# Patient Record
Sex: Female | Born: 1960 | Race: White | Hispanic: No | Marital: Married | Smoking: Never smoker
Health system: Southern US, Community
[De-identification: ages and names within clinical notes are randomized; demographics above are authoritative.]

## PROBLEM LIST (undated history)

## (undated) DIAGNOSIS — J302 Other seasonal allergic rhinitis: Secondary | ICD-10-CM

## (undated) DIAGNOSIS — F411 Generalized anxiety disorder: Secondary | ICD-10-CM

## (undated) DIAGNOSIS — G43909 Migraine, unspecified, not intractable, without status migrainosus: Secondary | ICD-10-CM

## (undated) DIAGNOSIS — G5600 Carpal tunnel syndrome, unspecified upper limb: Secondary | ICD-10-CM

## (undated) DIAGNOSIS — M5090 Cervical disc disorder, unspecified, unspecified cervical region: Secondary | ICD-10-CM

## (undated) DIAGNOSIS — N289 Disorder of kidney and ureter, unspecified: Secondary | ICD-10-CM

## (undated) DIAGNOSIS — R200 Anesthesia of skin: Secondary | ICD-10-CM

## (undated) DIAGNOSIS — M519 Unspecified thoracic, thoracolumbar and lumbosacral intervertebral disc disorder: Secondary | ICD-10-CM

## (undated) DIAGNOSIS — R3981 Functional urinary incontinence: Secondary | ICD-10-CM

## (undated) DIAGNOSIS — T8859XA Other complications of anesthesia, initial encounter: Secondary | ICD-10-CM

## (undated) DIAGNOSIS — R112 Nausea with vomiting, unspecified: Secondary | ICD-10-CM

## (undated) DIAGNOSIS — T4145XA Adverse effect of unspecified anesthetic, initial encounter: Secondary | ICD-10-CM

## (undated) DIAGNOSIS — I10 Essential (primary) hypertension: Secondary | ICD-10-CM

## (undated) DIAGNOSIS — E559 Vitamin D deficiency, unspecified: Secondary | ICD-10-CM

## (undated) DIAGNOSIS — E785 Hyperlipidemia, unspecified: Secondary | ICD-10-CM

## (undated) DIAGNOSIS — Z9889 Other specified postprocedural states: Secondary | ICD-10-CM

## (undated) HISTORY — DX: Anesthesia of skin: R20.0

## (undated) HISTORY — DX: Generalized anxiety disorder: F41.1

## (undated) HISTORY — DX: Hyperlipidemia, unspecified: E78.5

## (undated) HISTORY — DX: Vitamin D deficiency, unspecified: E55.9

## (undated) HISTORY — DX: Carpal tunnel syndrome, unspecified upper limb: G56.00

## (undated) HISTORY — PX: TONSILLECTOMY: SUR1361

## (undated) HISTORY — PX: ABDOMINAL HYSTERECTOMY: SHX81

## (undated) HISTORY — DX: Migraine, unspecified, not intractable, without status migrainosus: G43.909

## (undated) HISTORY — PX: CHOLECYSTECTOMY: SHX55

## (undated) HISTORY — DX: Cervical disc disorder, unspecified, unspecified cervical region: M50.90

## (undated) HISTORY — DX: Essential (primary) hypertension: I10

## (undated) HISTORY — PX: BREAST CYST EXCISION: SHX579

## (undated) HISTORY — DX: Other seasonal allergic rhinitis: J30.2

## (undated) HISTORY — DX: Functional urinary incontinence: R39.81

## (undated) HISTORY — PX: BREAST BIOPSY: SHX20

## (undated) HISTORY — DX: Unspecified thoracic, thoracolumbar and lumbosacral intervertebral disc disorder: M51.9

---
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
## 2003-07-03 HISTORY — PX: BLADDER SUSPENSION: SHX72

## 2015-08-09 ENCOUNTER — Encounter: Payer: Self-pay | Admitting: Primary Care

## 2015-08-09 ENCOUNTER — Ambulatory Visit (INDEPENDENT_AMBULATORY_CARE_PROVIDER_SITE_OTHER): Payer: BLUE CROSS/BLUE SHIELD | Admitting: Primary Care

## 2015-08-09 VITALS — BP 132/84 | HR 80 | Temp 97.7°F | Ht 68.25 in | Wt 200.8 lb

## 2015-08-09 DIAGNOSIS — I10 Essential (primary) hypertension: Secondary | ICD-10-CM

## 2015-08-09 DIAGNOSIS — R208 Other disturbances of skin sensation: Secondary | ICD-10-CM

## 2015-08-09 DIAGNOSIS — F419 Anxiety disorder, unspecified: Secondary | ICD-10-CM | POA: Insufficient documentation

## 2015-08-09 DIAGNOSIS — E785 Hyperlipidemia, unspecified: Secondary | ICD-10-CM | POA: Insufficient documentation

## 2015-08-09 DIAGNOSIS — F329 Major depressive disorder, single episode, unspecified: Secondary | ICD-10-CM | POA: Insufficient documentation

## 2015-08-09 DIAGNOSIS — F411 Generalized anxiety disorder: Secondary | ICD-10-CM | POA: Diagnosis not present

## 2015-08-09 DIAGNOSIS — R200 Anesthesia of skin: Secondary | ICD-10-CM | POA: Insufficient documentation

## 2015-08-09 DIAGNOSIS — F32A Depression, unspecified: Secondary | ICD-10-CM | POA: Insufficient documentation

## 2015-08-09 NOTE — Progress Notes (Signed)
Subjective:    Patient ID: Elaine Young, female    DOB: [birth year], 55 y.o.   MRN: [id number]  HPI  Elaine Young is a 55 year old female who presents today to establish care and discuss the problems mentioned below. Will obtain old records. Her last physical was 3 months ago.   1) Essential Hypertension: Diagnosed several years ago. Currently managed on amlodipine 5 mg. Denies chest pain, shortness of breath. She will get stress related headaches which are not too bothersome. She checks her blood pressure daily at home and has been getting readings of 120-130/70's.   2) Hyperlipidemia: Diagnosed years ago. Currently managed on Crestor 10 mg. Denies myalgias.  3) Migraines: Diagnosed years ago and will get a migraine once every 6 months. Her father had a brain aneurysm which was clipped. She will go for CT scans once every 5 years for monitoring. She believes she is due for her CT as it's been over 5 years, but would like to wait until this summer.  4) Left lower extremity numbness: Present for years, thought to be related to 36 years of standing for her occupation. She underwent an injection 2 years ago. She takes gabapentin 600 mg TID with improvement in pain. She will take this only as needed.   5) Generalized Anxiety Disorder: Diagnosed 15 years ago. She's currently managed on Effexor XR 150 mg and feels well managed at this dose. Denies SI/HI. Occasionally gets panic attacks and will do deep breathing exercises in order to reduce anxiety.   6) Functional Incontinence: Bladder sling placed in 2005. Hysterectomy. Symptoms are not bothersome but does wear daily panty liners. She doesn't feel as though the sling is working as well as it should but is not interested in further evaluation at this time.  Review of Systems  HENT: Negative for rhinorrhea.   Respiratory: Negative for cough and shortness of breath.   Cardiovascular: Negative for chest pain.  Gastrointestinal: Negative for diarrhea  and constipation.  Genitourinary: Negative for difficulty urinating.  Musculoskeletal: Negative for myalgias and arthralgias.  Skin: Negative for rash.  Allergic/Immunologic: Positive for environmental allergies.  Neurological: Negative for dizziness.       Occasional numbness to left lower extremity.  Occasional headaches.       Past Medical History  Diagnosis Date  . Allergy     Social History   Social History  . Marital Status: Married    Spouse Name: N/A  . Number of Children: N/A  . Years of Education: N/A   Occupational History  . Not on file.   Social History Main Topics  . Smoking status: Never Smoker   . Smokeless tobacco: Not on file  . Alcohol Use: 0.0 oz/week    0 Standard drinks or equivalent per week  . Drug Use: No  . Sexual Activity: Not on file   Other Topics Concern  . Not on file   Social History Narrative   Married.   1 child. 1 grandchild.   Retired.    Enjoys reading.     Past Surgical History  Procedure Laterality Date  . Abdominal hysterectomy    . Bladder suspension  2005    Family History  Problem Relation Age of Onset  . Hyperlipidemia Mother   . Ovarian cancer Maternal Aunt   . Lung cancer Maternal Uncle   . Arthritis Maternal Grandmother   . Arthritis Maternal Grandfather   . Colon cancer Maternal Grandfather   . Arthritis Paternal Grandmother   .  Hypertension Paternal Grandmother   . Diabetes Paternal Grandmother   . Arthritis Paternal Grandfather   . Diabetes Paternal Grandfather     Allergies  Allergen Reactions  . Sulfa Antibiotics Rash    No current outpatient prescriptions on file prior to visit.   No current facility-administered medications on file prior to visit.    BP 132/84 mmHg  Pulse 80  Temp(Src) 97.7 F (36.5 C) (Oral)  Ht 5' 8.25" (1.734 m)  Wt 200 lb 12.8 oz (91.082 kg)  BMI 30.29 kg/m2  SpO2 97%    Objective:   Physical Exam  Constitutional: She is oriented to person, place, and  time. She appears well-nourished.  Cardiovascular: Normal rate and regular rhythm.   Pulmonary/Chest: Effort normal and breath sounds normal.  Neurological: She is alert and oriented to person, place, and time.  Skin: Skin is warm and dry.  Psychiatric: She has a normal mood and affect.          Assessment & Plan:

## 2015-08-09 NOTE — Patient Instructions (Addendum)
Please send me an e-mail through My Chart regarding refills of your medications.  I will collect your records and call you with our next follow up appointment.  It was a pleasure to meet you today! Please don't hesitate to call me with any questions. Welcome to [facility]!

## 2015-08-09 NOTE — Assessment & Plan Note (Signed)
Long standing history of. Currently managed on Effexor XR 150 mg and feels well managed at this dose. Continue same.

## 2015-08-09 NOTE — Assessment & Plan Note (Signed)
Diagnosed years ago. Currently managed on amlodipine 5 mg. BP stable today. Continue current regimen. Will obtain records for recent BMP.

## 2015-08-09 NOTE — Assessment & Plan Note (Signed)
Located to left side. Long history. Once receiving injections. Managed on gabapentin PRN and feels well managed.

## 2015-08-09 NOTE — Assessment & Plan Note (Signed)
Currently managed on Crestor 10 mg. Endorses normal last lipid panel 3-6 months ago. Will obtain records.

## 2015-08-09 NOTE — Progress Notes (Signed)
Pre visit review using our clinic review tool, if applicable. No additional management support is needed unless otherwise documented below in the visit note. 

## 2015-08-29 ENCOUNTER — Encounter: Payer: Self-pay | Admitting: Primary Care

## 2015-08-29 ENCOUNTER — Ambulatory Visit (INDEPENDENT_AMBULATORY_CARE_PROVIDER_SITE_OTHER): Payer: BLUE CROSS/BLUE SHIELD | Admitting: Primary Care

## 2015-08-29 VITALS — BP 122/72 | HR 86 | Temp 98.1°F | Ht 68.25 in | Wt 197.8 lb

## 2015-08-29 DIAGNOSIS — J069 Acute upper respiratory infection, unspecified: Secondary | ICD-10-CM | POA: Diagnosis not present

## 2015-08-29 NOTE — Patient Instructions (Signed)
Cough/Congestion: Try taking Mucinex DM. This will help loosen up the mucous in your chest. Ensure you take this medication with a full glass of water.  Nasal Congestion: Try using Flonase (fluticasone) nasal spray. Instill 2 sprays in each nostril once daily.   Continue Dayquil and Nyquil as discussed.  Please notify me if you develop persistent fevers of 101, start coughing up green mucous, notice increased fatigue or weakness, or feel worse after 1 week of onset of symptoms.   Increase consumption of water intake and rest.  Upper Respiratory Infection, Adult Most upper respiratory infections (URIs) are a viral infection of the air passages leading to the lungs. A URI affects the nose, throat, and upper air passages. The most common type of URI is nasopharyngitis and is typically referred to as "the common cold." URIs run their course and usually go away on their own. Most of the time, a URI does not require medical attention, but sometimes a bacterial infection in the upper airways can follow a viral infection. This is called a secondary infection. Sinus and middle ear infections are common types of secondary upper respiratory infections. Bacterial pneumonia can also complicate a URI. A URI can worsen asthma and chronic obstructive pulmonary disease (COPD). Sometimes, these complications can require emergency medical care and may be life threatening.  CAUSES Almost all URIs are caused by viruses. A virus is a type of germ and can spread from one person to another.  RISKS FACTORS You may be at risk for a URI if:   You smoke.   You have chronic heart or lung disease.  You have a weakened defense (immune) system.   You are very young or very old.   You have nasal allergies or asthma.  You work in crowded or poorly ventilated areas.  You work in health care facilities or schools. SIGNS AND SYMPTOMS  Symptoms typically develop 2-3 days after you come in contact with a cold virus.  Most viral URIs last 7-10 days. However, viral URIs from the influenza virus (flu virus) can last 14-18 days and are typically more severe. Symptoms may include:   Runny or stuffy (congested) nose.   Sneezing.   Cough.   Sore throat.   Headache.   Fatigue.   Fever.   Loss of appetite.   Pain in your forehead, behind your eyes, and over your cheekbones (sinus pain).  Muscle aches.  DIAGNOSIS  Your health care provider may diagnose a URI by:  Physical exam.  Tests to check that your symptoms are not due to another condition such as:  Strep throat.  Sinusitis.  Pneumonia.  Asthma. TREATMENT  A URI goes away on its own with time. It cannot be cured with medicines, but medicines may be prescribed or recommended to relieve symptoms. Medicines may help:  Reduce your fever.  Reduce your cough.  Relieve nasal congestion. HOME CARE INSTRUCTIONS   Take medicines only as directed by your health care provider.   Gargle warm saltwater or take cough drops to comfort your throat as directed by your health care provider.  Use a warm mist humidifier or inhale steam from a shower to increase air moisture. This may make it easier to breathe.  Drink enough fluid to keep your urine clear or pale yellow.   Eat soups and other clear broths and maintain good nutrition.   Rest as needed.   Return to work when your temperature has returned to normal or as your health care provider advises.  You may need to stay home longer to avoid infecting others. You can also use a face mask and careful hand washing to prevent spread of the virus.  Increase the usage of your inhaler if you have asthma.   Do not use any tobacco products, including cigarettes, chewing tobacco, or electronic cigarettes. If you need help quitting, ask your health care provider. PREVENTION  The best way to protect yourself from getting a cold is to practice good hygiene.   Avoid oral or hand contact  with people with cold symptoms.   Wash your hands often if contact occurs.  There is no clear evidence that vitamin C, vitamin E, echinacea, or exercise reduces the chance of developing a cold. However, it is always recommended to get plenty of rest, exercise, and practice good nutrition.  SEEK MEDICAL CARE IF:   You are getting worse rather than better.   Your symptoms are not controlled by medicine.   You have chills.  You have worsening shortness of breath.  You have brown or red mucus.  You have yellow or brown nasal discharge.  You have pain in your face, especially when you bend forward.  You have a fever.  You have swollen neck glands.  You have pain while swallowing.  You have white areas in the back of your throat. SEEK IMMEDIATE MEDICAL CARE IF:   You have severe or persistent:  Headache.  Ear pain.  Sinus pain.  Chest pain.  You have chronic lung disease and any of the following:  Wheezing.  Prolonged cough.  Coughing up blood.  A change in your usual mucus.  You have a stiff neck.  You have changes in your:  Vision.  Hearing.  Thinking.  Mood. MAKE SURE YOU:   Understand these instructions.  Will watch your condition.  Will get help right away if you are not doing well or get worse.   This information is not intended to replace advice given to you by your health care provider. Make sure you discuss any questions you have with your health care provider.   Document Released: 12/12/2000 Document Revised: 11/02/2014 Document Reviewed: 09/23/2013 Elsevier Interactive Patient Education [employer]

## 2015-08-29 NOTE — Progress Notes (Signed)
Pre visit review using our clinic review tool, if applicable. No additional management support is needed unless otherwise documented below in the visit note. 

## 2015-08-29 NOTE — Progress Notes (Signed)
Subjective:    Patient ID: Elaine Young, female    DOB: [birth year], 55 y.o.   MRN: [id number]  HPI  Ms. Sheehy is a 55 year old female who presents today with a chief complaint of cough. She also reports nasal congestion, headache, body aches, sinus pressure, chills, sore throat. Her symptoms have been present since Friday afternoon last week. She admits to low grade fevers at home. Her most bothersome symptom is her cough. She's been taking Dayquil and Nyquil at home with no improvement. Her cough is dry. She's been exposed to a co-worker who was diagnosed with the strep last week.  Review of Systems  Constitutional: Positive for fever, chills and fatigue.  HENT: Positive for congestion, sinus pressure and sore throat.   Respiratory: Positive for cough. Negative for shortness of breath.   Cardiovascular: Negative for chest pain.  Musculoskeletal: Positive for myalgias.  Neurological: Positive for headaches.        Past Medical History  Diagnosis Date  . Seasonal allergies   . Hyperlipidemia   . Essential hypertension   . Lower extremity numbness   . Generalized anxiety disorder   . Functional incontinence   . Migraines     Social History   Social History  . Marital Status: Married    Spouse Name: N/A  . Number of Children: N/A  . Years of Education: N/A   Occupational History  . Not on file.   Social History Main Topics  . Smoking status: Never Smoker   . Smokeless tobacco: Not on file  . Alcohol Use: 0.0 oz/week    0 Standard drinks or equivalent per week  . Drug Use: No  . Sexual Activity: Not on file   Other Topics Concern  . Not on file   Social History Narrative   Married.   1 child. 1 grandchild.   Retired.    Enjoys reading.     Past Surgical History  Procedure Laterality Date  . Abdominal hysterectomy    . Bladder suspension  2005    Family History  Problem Relation Age of Onset  . Hyperlipidemia Mother   . Ovarian cancer Maternal  Aunt   . Lung cancer Maternal Uncle   . Arthritis Maternal Grandmother   . Arthritis Maternal Grandfather   . Colon cancer Maternal Grandfather   . Arthritis Paternal Grandmother   . Hypertension Paternal Grandmother   . Diabetes Paternal Grandmother   . Arthritis Paternal Grandfather   . Diabetes Paternal Grandfather     Allergies  Allergen Reactions  . Codeine   . Latex   . Lipitor [Atorvastatin]   . Sulfa Antibiotics Rash    Current Outpatient Prescriptions on File Prior to Visit  Medication Sig Dispense Refill  . amLODipine (NORVASC) 5 MG tablet Take 5 mg by mouth daily.    Marland Kitchen gabapentin (NEURONTIN) 600 MG tablet Take 600 mg by mouth 3 (three) times daily.    . rosuvastatin (CRESTOR) 10 MG tablet Take 10 mg by mouth daily.    Marland Kitchen venlafaxine XR (EFFEXOR-XR) 150 MG 24 hr capsule Take 150 mg by mouth daily with breakfast.     No current facility-administered medications on file prior to visit.    BP 122/72 mmHg  Pulse 86  Temp(Src) 98.1 F (36.7 C) (Oral)  Ht 5' 8.25" (1.734 m)  Wt 197 lb 12.8 oz (89.721 kg)  BMI 29.84 kg/m2  SpO2 99%    Objective:   Physical Exam  Constitutional: She appears  well-nourished.  HENT:  Right Ear: Tympanic membrane and ear canal normal.  Left Ear: Tympanic membrane and ear canal normal.  Nose: Right sinus exhibits maxillary sinus tenderness. Right sinus exhibits no frontal sinus tenderness. Left sinus exhibits maxillary sinus tenderness. Left sinus exhibits no frontal sinus tenderness.  Mouth/Throat: Oropharynx is clear and moist.  Eyes: Conjunctivae are normal.  Neck: Neck supple.  Cardiovascular: Normal rate and regular rhythm.   Pulmonary/Chest: Effort normal and breath sounds normal. She has no wheezes. She has no rales.  Lymphadenopathy:    She has no cervical adenopathy.  Skin: Skin is warm and dry.          Assessment & Plan:  Viral URI:  Cough, chills, low grade fevers, sinus pressure x 3 days. She's not taken much  OTC for her symptoms. Exam with clear lungs and mostly normal ENT exam. Suspect viral involvement at this point and will treat with supportive measures. Mucinex, Flonase, Robitussin. Fluids, rest, return precautions provided.

## 2015-09-30 ENCOUNTER — Encounter: Payer: Self-pay | Admitting: Primary Care

## 2016-01-24 ENCOUNTER — Ambulatory Visit (INDEPENDENT_AMBULATORY_CARE_PROVIDER_SITE_OTHER): Payer: BLUE CROSS/BLUE SHIELD | Admitting: Primary Care

## 2016-01-24 ENCOUNTER — Encounter: Payer: Self-pay | Admitting: Primary Care

## 2016-01-24 VITALS — BP 122/84 | HR 80 | Temp 98.3°F | Ht 68.25 in | Wt 200.0 lb

## 2016-01-24 DIAGNOSIS — R21 Rash and other nonspecific skin eruption: Secondary | ICD-10-CM

## 2016-01-24 MED ORDER — TRIAMCINOLONE ACETONIDE 0.1 % EX CREA: 1.0000 "application " | TOPICAL_CREAM | Freq: Two times a day (BID) | CUTANEOUS | 0 refills | Status: DC

## 2016-01-24 NOTE — Patient Instructions (Signed)
I sent a prescription for Triamcinolone cream to your pharmacy. Apply this to the rash twice daily if no improvement or if the rash returns.  Ensure you are wearing sunscreen on your upcoming cruise. Have a great time!  Please schedule a physical with me before the end of the year. You may also schedule a lab only appointment 3-4 days prior. We will discuss your lab results in detail during your physical.  It was a pleasure to see you today!

## 2016-01-24 NOTE — Progress Notes (Signed)
Subjective:    Patient ID: Elaine Young, female    DOB: 1961-03-05, 55 y.o.   MRN: [id number]  HPI  [name] is a 55 year old female who presents today with a chief complaint of rash. Her rash is present to the bilateral lower portion of her extremities that has been present since Wednesday night last week. Her rash is red without the presence of bumps or lesions. Overall her rash has improved with only little erythema today but is worried as she will be traveling on a cruise in several days. She was outdoors at a theme park 1 week ago but noticed the rash after sitting at work all day. Denies itching; discomfort; tick bites; working out in the yard; changes in food clothing, soaps, detergents. No one else in her household has experienced a rash.   Review of Systems  Constitutional: Negative for fatigue and fever.  Musculoskeletal: Negative for arthralgias.  Skin: Positive for rash.       Denies pruritus        Past Medical History:  Diagnosis Date  . Carpal tunnel syndrome   . Cervical disc disorder   . Essential hypertension   . Functional incontinence   . Generalized anxiety disorder   . Hyperlipidemia   . Lower extremity numbness   . Lumbar disc disease   . Migraines   . Seasonal allergies   . Vitamin D deficiency      Social History   Social History  . Marital status: Married    Spouse name: N/A  . Number of children: N/A  . Years of education: N/A   Occupational History  . Not on file.   Social History Main Topics  . Smoking status: Never Smoker  . Smokeless tobacco: Not on file  . Alcohol use 0.0 oz/week  . Drug use: No  . Sexual activity: Not on file   Other Topics Concern  . Not on file   Social History Narrative   Married.   1 child. 1 grandchild.   Retired.    Enjoys reading.     Past Surgical History:  Procedure Laterality Date  . ABDOMINAL HYSTERECTOMY    . BLADDER SUSPENSION  2005    Family History  Problem Relation Age of  Onset  . Hyperlipidemia Mother   . Ovarian cancer Maternal Aunt   . Lung cancer Maternal Uncle   . Arthritis Maternal Grandmother   . Arthritis Maternal Grandfather   . Colon cancer Maternal Grandfather   . Arthritis Paternal Grandmother   . Hypertension Paternal Grandmother   . Diabetes Paternal Grandmother   . Arthritis Paternal Grandfather   . Diabetes Paternal Grandfather     Allergies  Allergen Reactions  . Codeine   . Latex   . Lipitor [Atorvastatin]   . Sulfa Antibiotics Rash    Current Outpatient Prescriptions on File Prior to Visit  Medication Sig Dispense Refill  . amLODipine (NORVASC) 5 MG tablet Take 5 mg by mouth daily.    . rosuvastatin (CRESTOR) 10 MG tablet Take 10 mg by mouth daily.    Marland Kitchen venlafaxine XR (EFFEXOR-XR) 150 MG 24 hr capsule Take 150 mg by mouth daily with breakfast.     No current facility-administered medications on file prior to visit.     BP 122/84 (BP Location: Right Arm, Patient Position: Sitting, Cuff Size: Normal)   Pulse 80   Temp 98.3 F (36.8 C) (Oral)   Ht 5' 8.25" (1.734 m)   Wt  200 lb (90.7 kg)   SpO2 97%   BMI 30.19 kg/m    Objective:   Physical Exam  Constitutional: She appears well-nourished.  Cardiovascular: Normal rate and regular rhythm.   Pulmonary/Chest: Effort normal and breath sounds normal.  Skin: Skin is warm and dry. There is erythema.  Mild erythema to lower portion of bilateral lower extremities. Epiermis intact, no lesions or raised bumps.          Assessment & Plan:  Rash:  Present to bilateral portion of lower extremities 6 days. No itching, lesions, raised bumps, pain. No one else in household has experienced a rash. No changes in diet, soaps/detergents. Denies tick bites or bug bites. Rash appears to be delayed from sun exposure, likely from theme park visit 1 week ago. Rash appears to be healing well with only mild erythema remaining. Nontender. [place] long discussion regarding regular application of sunscreen. We'll also provide her with prescription for potency triamcinolone cream to use twice daily as needed only if rash returns or worsens. For now we will watch and wait.  Morrie Sheldon, NP

## 2016-01-24 NOTE — Progress Notes (Signed)
Pre visit review using our clinic review tool, if applicable. No additional management support is needed unless otherwise documented below in the visit note. 

## 2016-03-29 ENCOUNTER — Other Ambulatory Visit: Payer: Self-pay | Admitting: Primary Care

## 2016-03-29 ENCOUNTER — Other Ambulatory Visit (INDEPENDENT_AMBULATORY_CARE_PROVIDER_SITE_OTHER): Payer: BLUE CROSS/BLUE SHIELD

## 2016-03-29 DIAGNOSIS — E785 Hyperlipidemia, unspecified: Secondary | ICD-10-CM

## 2016-03-29 DIAGNOSIS — Z1159 Encounter for screening for other viral diseases: Secondary | ICD-10-CM

## 2016-03-29 DIAGNOSIS — R7303 Prediabetes: Secondary | ICD-10-CM

## 2016-03-30 LAB — LIPID PANEL
CHOL/HDL RATIO: 3.2 ratio (ref 0.0–4.4)
Cholesterol, Total: 184 mg/dL (ref 100–199)
HDL: 58 mg/dL (ref >39)
LDL CALC: 100 mg/dL — AB (ref 0–99)
Triglycerides: 128 mg/dL (ref 0–149)
VLDL Cholesterol Cal: 26 mg/dL (ref 5–40)

## 2016-03-30 LAB — COMPREHENSIVE METABOLIC PANEL
A/G RATIO: 1.6 (ref 1.2–2.2)
ALBUMIN: 4.4 g/dL (ref 3.5–5.5)
ALT: 24 IU/L (ref 0–32)
AST: 21 IU/L (ref 0–40)
Alkaline Phosphatase: 86 IU/L (ref 39–117)
BILIRUBIN TOTAL: 0.4 mg/dL (ref 0.0–1.2)
BUN / CREAT RATIO: 16 (ref 9–23)
BUN: 13 mg/dL (ref 6–24)
CHLORIDE: 102 mmol/L (ref 96–106)
CO2: 22 mmol/L (ref 18–29)
Calcium: 9.5 mg/dL (ref 8.7–10.2)
Creatinine, Ser: 0.83 mg/dL (ref 0.57–1.00)
GFR calc non Af Amer: 79 mL/min/{1.73_m2} (ref >59)
GFR, EST AFRICAN AMERICAN: 91 mL/min/{1.73_m2} (ref >59)
Globulin, Total: 2.8 g/dL (ref 1.5–4.5)
Glucose: 105 mg/dL — ABNORMAL HIGH (ref 65–99)
POTASSIUM: 4.1 mmol/L (ref 3.5–5.2)
Sodium: 140 mmol/L (ref 134–144)
TOTAL PROTEIN: 7.2 g/dL (ref 6.0–8.5)

## 2016-03-30 LAB — HEPATITIS C ANTIBODY: Hep C Virus Ab: <0.1 s/co ratio (ref 0.0–0.9)

## 2016-03-30 LAB — HEMOGLOBIN A1C
Est. average glucose Bld gHb Est-mCnc: 120 mg/dL
Hgb A1c MFr Bld: 5.8 % — ABNORMAL HIGH (ref 4.8–5.6)

## 2016-04-03 ENCOUNTER — Telehealth: Payer: Self-pay | Admitting: Primary Care

## 2016-04-03 ENCOUNTER — Other Ambulatory Visit: Payer: Self-pay | Admitting: Primary Care

## 2016-04-03 ENCOUNTER — Encounter: Payer: Self-pay | Admitting: Primary Care

## 2016-04-03 ENCOUNTER — Ambulatory Visit (INDEPENDENT_AMBULATORY_CARE_PROVIDER_SITE_OTHER): Payer: BLUE CROSS/BLUE SHIELD | Admitting: Primary Care

## 2016-04-03 VITALS — BP 126/84 | HR 93 | Temp 98.3°F | Ht 68.0 in | Wt 199.8 lb

## 2016-04-03 DIAGNOSIS — Z8249 Family history of ischemic heart disease and other diseases of the circulatory system: Secondary | ICD-10-CM | POA: Diagnosis not present

## 2016-04-03 DIAGNOSIS — Z0000 Encounter for general adult medical examination without abnormal findings: Secondary | ICD-10-CM | POA: Insufficient documentation

## 2016-04-03 DIAGNOSIS — R7303 Prediabetes: Secondary | ICD-10-CM | POA: Insufficient documentation

## 2016-04-03 DIAGNOSIS — Z91013 Allergy to seafood: Secondary | ICD-10-CM

## 2016-04-03 DIAGNOSIS — Z23 Encounter for immunization: Secondary | ICD-10-CM | POA: Diagnosis not present

## 2016-04-03 DIAGNOSIS — Z1239 Encounter for other screening for malignant neoplasm of breast: Secondary | ICD-10-CM

## 2016-04-03 DIAGNOSIS — G43901 Migraine, unspecified, not intractable, with status migrainosus: Secondary | ICD-10-CM | POA: Diagnosis not present

## 2016-04-03 DIAGNOSIS — E1165 Type 2 diabetes mellitus with hyperglycemia: Secondary | ICD-10-CM | POA: Insufficient documentation

## 2016-04-03 DIAGNOSIS — E785 Hyperlipidemia, unspecified: Secondary | ICD-10-CM

## 2016-04-03 DIAGNOSIS — I10 Essential (primary) hypertension: Secondary | ICD-10-CM | POA: Diagnosis not present

## 2016-04-03 DIAGNOSIS — Z0001 Encounter for general adult medical examination with abnormal findings: Secondary | ICD-10-CM | POA: Insufficient documentation

## 2016-04-03 DIAGNOSIS — F411 Generalized anxiety disorder: Secondary | ICD-10-CM | POA: Diagnosis not present

## 2016-04-03 DIAGNOSIS — Z1231 Encounter for screening mammogram for malignant neoplasm of breast: Secondary | ICD-10-CM

## 2016-04-03 MED ORDER — VENLAFAXINE HCL ER 150 MG PO CP24
150.0000 mg | ORAL_CAPSULE | Freq: Every day | ORAL | 3 refills | Status: DC
Start: 2016-04-03 — End: 2017-04-15

## 2016-04-03 MED ORDER — AMLODIPINE BESYLATE 5 MG PO TABS: 5.0000 mg | ORAL_TABLET | Freq: Every day | ORAL | 3 refills | Status: DC

## 2016-04-03 NOTE — Progress Notes (Signed)
Subjective:    Patient ID: Barnet PallJudith Marie Young, female    DOB: September 13, 1960, 55 y.o.   MRN: [id number]  HPI  Elaine Young is a 55 year old female who presents today for complete physical.  Immunizations: -Tetanus: Unsure.  -Influenza: Due today.  Diet: She endorses a fair diet. Breakfast: Fruit, mostly skips Lunch: Skips Dinner: Meat, vegetable, startch Snacks: Cookies Desserts: Daily  Beverages: Water (5-10 glasses/bottles daily), diet soda, coffee  Exercise: She does not currently exercise. Eye exam: Completed in 2017 Dental exam: Completes semi-annually Colonoscopy: Never completed, declines Pap Smear: Hysterectomy Mammogram: Completed years ago.   Review of Systems  Constitutional: Negative for unexpected weight change.  HENT: Negative for rhinorrhea.   Respiratory: Negative for cough and shortness of breath.   Cardiovascular: Negative for chest pain.  Gastrointestinal: Negative for constipation and diarrhea.  Genitourinary: Negative for difficulty urinating and menstrual problem.  Musculoskeletal: Negative for arthralgias and myalgias.  Skin: Negative for rash.  Allergic/Immunologic: Negative for environmental allergies.  Neurological: Negative for dizziness, numbness and headaches.  Psychiatric/Behavioral:       Fells well managed on Effexor.       Past Medical History:  Diagnosis Date  . Carpal tunnel syndrome   . Cervical disc disorder   . Essential hypertension   . Functional incontinence   . Generalized anxiety disorder   . Hyperlipidemia   . Lower extremity numbness   . Lumbar disc disease   . Migraines   . Seasonal allergies   . Vitamin D deficiency      Social History   Social History  . Marital status: Married    Spouse name: N/A  . Number of children: N/A  . Years of education: N/A   Occupational History  . Not on file.   Social History Main Topics  . Smoking status: Never Smoker  . Smokeless tobacco: Not on file  . Alcohol use  0.0 oz/week  . Drug use: No  . Sexual activity: Not on file   Other Topics Concern  . Not on file   Social History Narrative   Married.   1 child. 1 grandchild.   Retired.    Enjoys reading.     Past Surgical History:  Procedure Laterality Date  . ABDOMINAL HYSTERECTOMY    . BLADDER SUSPENSION  2005    Family History  Problem Relation Age of Onset  . Hyperlipidemia Mother   . Ovarian cancer Maternal Aunt   . Lung cancer Maternal Uncle   . Arthritis Maternal Grandmother   . Arthritis Maternal Grandfather   . Colon cancer Maternal Grandfather   . Arthritis Paternal Grandmother   . Hypertension Paternal Grandmother   . Diabetes Paternal Grandmother   . Arthritis Paternal Grandfather   . Diabetes Paternal Grandfather     Allergies  Allergen Reactions  . Codeine   . Latex   . Lipitor [Atorvastatin]   . Sulfa Antibiotics Rash    Current Outpatient Prescriptions on File Prior to Visit  Medication Sig Dispense Refill  . rosuvastatin (CRESTOR) 10 MG tablet Take 10 mg by mouth daily.     No current facility-administered medications on file prior to visit.     BP 126/84   Pulse 93   Temp 98.3 F (36.8 C) (Oral)   Ht 5\' 8"  (1.727 m)   Wt 199 lb 12.8 oz (90.6 kg)   SpO2 94%   BMI 30.38 kg/m    Objective:   Physical Exam  Constitutional: She is  oriented to person, place, and time. She appears well-nourished.  HENT:  Right Ear: Tympanic membrane and ear canal normal.  Left Ear: Tympanic membrane and ear canal normal.  Nose: Nose normal.  Mouth/Throat: Oropharynx is clear and moist.  Eyes: Conjunctivae and EOM are normal. Pupils are equal, round, and reactive to light.  Neck: Neck supple. No thyromegaly present.  Cardiovascular: Normal rate and regular rhythm.   No murmur heard. Pulmonary/Chest: Effort normal and breath sounds normal. She has no rales.  Abdominal: Soft. Bowel sounds are normal. There is no tenderness.  Musculoskeletal: Normal range of  motion.  Lymphadenopathy:    She has no cervical adenopathy.  Neurological: She is alert and oriented to person, place, and time. She has normal reflexes. No cranial nerve deficit.  Skin: Skin is warm and dry. No rash noted.  Psychiatric: She has a normal mood and affect.          Assessment & Plan:

## 2016-04-03 NOTE — Assessment & Plan Note (Signed)
Aneurysm in father. Patient has long history of migraines. She completes CT scans of her head every 5 years, due this year. CT angio of head ordered. Will premedicate given shellfish allergy.

## 2016-04-03 NOTE — Assessment & Plan Note (Signed)
Lipid panel stable on Crestor. Continue same. Denies myalgias.

## 2016-04-03 NOTE — Assessment & Plan Note (Signed)
A1C of 5.8 on recent labs. Discussed proper changes needed in diet and regular exercise. Repeat A1C in 6 months.

## 2016-04-03 NOTE — Telephone Encounter (Signed)
Please notify patient that I will be sending prednisone to her pharmacy. She will also need to take benadryl, 50 mg 1 hour prior to her procedure. This may be purchased over the counter. Which pharmacy should I send the prednisone to?

## 2016-04-03 NOTE — Assessment & Plan Note (Signed)
Stable on Amlodipine 5 mg. Refill provided today.

## 2016-04-03 NOTE — Progress Notes (Signed)
Pre visit review using our clinic review tool, if applicable. No additional management support is needed unless otherwise documented below in the visit note. 

## 2016-04-03 NOTE — Patient Instructions (Addendum)
Stop by the front desk and speak with either Shirlee Limerick or Revonda Standard regarding your CT scan.  I sent refills of Amlodipine and Effexor through [facility].  You will be contacted regarding your mammogram.  Please let us know if you have not heard back within one week.   Your blood sugars are running slightly too high. This will improve with improvements in diet and regular exercise.  It's importance to improve your diet by reducing consumption of fast food, fried food, processed snack foods, sugary drinks. Increase consumption of fresh vegetables and fruits, whole grains, water.  Ensure you are drinking 64 ounces of water daily.  Start exercising. You should be getting 150 minutes of moderate intensity exercise weekly.  Schedule a lab only appointment in 6 months for recheck of your blood sugar.  Follow up in 1 year for repeat physical or sooner if needed. It was a pleasure to see you today!  Prediabetes Eating Plan Prediabetes--also called impaired glucose tolerance or impaired fasting glucose--is a condition that causes blood sugar (blood glucose) levels to be higher than normal. Following a healthy diet can help to keep prediabetes under control. It can also help to lower the risk of type 2 diabetes and heart disease, which are increased in people who have prediabetes. Along with regular exercise, a healthy diet:  Promotes weight loss.  Helps to control blood sugar levels.  Helps to improve the way that the body uses insulin. WHAT DO I NEED TO KNOW ABOUT THIS EATING PLAN?  Use the glycemic index (GI) to plan your meals. The index tells you how quickly a food will raise your blood sugar. Choose low-GI foods. These foods take a longer time to raise blood sugar.  Pay close attention to the amount of carbohydrates in the food that you eat. Carbohydrates increase blood sugar levels.  Keep track of how many calories you take in. Eating the right amount of calories will help you to achieve  a healthy weight. Losing about 7 percent of your starting weight can help to prevent type 2 diabetes.  You may want to follow a Mediterranean diet. This diet includes a lot of vegetables, lean meats or fish, whole grains, fruits, and healthy oils and fats. WHAT FOODS CAN I EAT? Grains Whole grains, such as whole-wheat or whole-grain breads, crackers, cereals, and pasta. Unsweetened oatmeal. Bulgur. Barley. Quinoa. Brown rice. Corn or whole-wheat flour tortillas or taco shells. Vegetables Lettuce. Spinach. Peas. Beets. Cauliflower. Cabbage. Broccoli. Carrots. Tomatoes. Squash. Eggplant. Herbs. Peppers. Onions. Cucumbers. Brussels sprouts. Fruits Berries. Bananas. Apples. Oranges. Grapes. Papaya. Mango. Pomegranate. Kiwi. Grapefruit. Cherries. Meats and Other Protein Sources Seafood. Lean meats, such as chicken and [place] or lean cuts of pork and beef. Tofu. Eggs. Nuts. Beans. Dairy Low-fat or fat-free dairy products, such as yogurt, cottage cheese, and cheese. Beverages Water. Tea. Coffee. Sugar-free or diet soda. Seltzer water. Milk. Milk alternatives, such as soy or almond milk. Condiments Mustard. Relish. Low-fat, low-sugar ketchup. Low-fat, low-sugar barbecue sauce. Low-fat or fat-free mayonnaise. Sweets and Desserts Sugar-free or low-fat pudding. Sugar-free or low-fat ice cream and other frozen treats. Fats and Oils Avocado. Walnuts. Olive oil. The items listed above may not be a complete list of recommended foods or beverages. Contact your dietitian for more options.  WHAT FOODS ARE NOT RECOMMENDED? Grains Refined white flour and flour products, such as bread, pasta, snack foods, and cereals. Beverages Sweetened drinks, such as sweet iced tea and soda. Sweets and Desserts Baked goods, such as cake, cupcakes,  pastries, cookies, and cheesecake. The items listed above may not be a complete list of foods and beverages to avoid. Contact your dietitian for more information.   This  information is not intended to replace advice given to you by your health care provider. Make sure you discuss any questions you have with your health care provider.   Document Released: 11/02/2014 Document Reviewed: 11/02/2014 Elsevier Interactive Patient Education [employer]2016 Elsevier Inc.

## 2016-04-03 NOTE — Addendum Note (Signed)
Addended by: Tawnya CrookSAMBATH, Deysi Soldo on: 04/03/2016 12:41 PM   Modules accepted: Orders

## 2016-04-03 NOTE — Assessment & Plan Note (Signed)
Tdap due, provided today. Influenza vaccination provided today. Mammogram due, ordered. Colonoscopy due, patient opts for ColoGuard. Information provided. Discussed the importance of a healthy diet and regular exercise in order for weight loss, and to reduce the risk of other medical diseases. Exam unremarkable. Labs with prediabetes, otherwise unremarkable. Follow up in 1 year for annual physical.

## 2016-04-03 NOTE — Assessment & Plan Note (Signed)
Feels well managed on Effexor, continue same. Refills provided. Denies SI/HI

## 2016-04-04 ENCOUNTER — Encounter: Payer: Self-pay | Admitting: Primary Care

## 2016-04-04 MED ORDER — PREDNISONE 10 MG PO TABS: ORAL_TABLET | ORAL | 0 refills | Status: DC

## 2016-04-04 NOTE — Telephone Encounter (Signed)
Message left for patient to return my call.  

## 2016-04-04 NOTE — Telephone Encounter (Signed)
Noted. Rx sent in, my chart message sent with directions for use.

## 2016-04-04 NOTE — Telephone Encounter (Signed)
Spoken and notified patient of Kate's comments. [facility] [address]

## 2016-04-17 ENCOUNTER — Ambulatory Visit (INDEPENDENT_AMBULATORY_CARE_PROVIDER_SITE_OTHER)
Admission: RE | Admit: 2016-04-17 | Discharge: 2016-04-17 | Disposition: A | Discharge status: Home / Self Care | Payer: BLUE CROSS/BLUE SHIELD | Source: Ambulatory Visit | Attending: Primary Care | Admitting: Primary Care

## 2016-04-17 ENCOUNTER — Encounter: Payer: Self-pay | Admitting: Radiology

## 2016-04-17 DIAGNOSIS — G43901 Migraine, unspecified, not intractable, with status migrainosus: Secondary | ICD-10-CM | POA: Diagnosis not present

## 2016-04-17 DIAGNOSIS — Z8249 Family history of ischemic heart disease and other diseases of the circulatory system: Secondary | ICD-10-CM | POA: Diagnosis not present

## 2016-04-17 MED ORDER — IOPAMIDOL (ISOVUE-370) INJECTION 76%
75.0000 mL | Freq: Once | INTRAVENOUS | Status: AC | PRN
  Administered 2016-04-17: 75 mL via INTRAVENOUS

## 2016-04-20 ENCOUNTER — Encounter: Payer: Self-pay | Admitting: Primary Care

## 2016-04-20 ENCOUNTER — Ambulatory Visit (INDEPENDENT_AMBULATORY_CARE_PROVIDER_SITE_OTHER): Payer: BLUE CROSS/BLUE SHIELD | Admitting: Primary Care

## 2016-04-20 VITALS — BP 138/82 | HR 78 | Temp 97.8°F | Ht 68.25 in | Wt 195.8 lb

## 2016-04-20 DIAGNOSIS — H109 Unspecified conjunctivitis: Secondary | ICD-10-CM

## 2016-04-20 MED ORDER — POLYMYXIN B-TRIMETHOPRIM 10000-0.1 UNIT/ML-% OP SOLN: 1.0000 [drp] | Freq: Four times a day (QID) | OPHTHALMIC | 0 refills | Status: DC

## 2016-04-20 NOTE — Patient Instructions (Signed)
Start trimethoprim-polymixin B eye drops for eye infection. Instill 1 drop into the right eye every 6 hours for 5-7 days.   Please notify me if no improvement in 3-4 days.  It was a pleasure to see you today!

## 2016-04-20 NOTE — Progress Notes (Signed)
Subjective:    Patient ID: Elaine Young, female    DOB: 1961/05/21, 55 y.o.   MRN: [id number]  HPI  [name] is a 55 year old female who presents today with a chief complaint of eye irritation. Her irritation is located to the right eye. Her symptoms include watering, yellow drainage, and soreness. Her son visited his optometrist and was diagnosed with an eye infection and was prescribed eye drops. She denies fevers, itching, nasal congestion, cough, sneezing.   Review of Systems  Constitutional: Negative for fever.  HENT: Negative for congestion and sneezing.   Eyes: Positive for discharge, redness and visual disturbance. Negative for itching.       Eye soreness  Respiratory: Negative for cough.        Past Medical History:  Diagnosis Date  . Carpal tunnel syndrome   . Cervical disc disorder   . Essential hypertension   . Functional incontinence   . Generalized anxiety disorder   . Hyperlipidemia   . Lower extremity numbness   . Lumbar disc disease   . Migraines   . Seasonal allergies   . Vitamin D deficiency      Social History   Social History  . Marital status: Married    Spouse name: N/A  . Number of children: N/A  . Years of education: N/A   Occupational History  . Not on file.   Social History Main Topics  . Smoking status: Never Smoker  . Smokeless tobacco: Not on file  . Alcohol use 0.0 oz/week  . Drug use: No  . Sexual activity: Not on file   Other Topics Concern  . Not on file   Social History Narrative   Married.   1 child. 1 grandchild.   Retired.    Enjoys reading.     Past Surgical History:  Procedure Laterality Date  . ABDOMINAL HYSTERECTOMY    . BLADDER SUSPENSION  2005    Family History  Problem Relation Age of Onset  . Hyperlipidemia Mother   . Ovarian cancer Maternal Aunt   . Lung cancer Maternal Uncle   . Arthritis Maternal Grandmother   . Arthritis Maternal Grandfather   . Colon cancer Maternal Grandfather     . Arthritis Paternal Grandmother   . Hypertension Paternal Grandmother   . Diabetes Paternal Grandmother   . Arthritis Paternal Grandfather   . Diabetes Paternal Grandfather     Allergies  Allergen Reactions  . Codeine   . Iodides   . Latex   . Lipitor [Atorvastatin]   . Shellfish Allergy Swelling and Rash  . Sulfa Antibiotics Rash    Current Outpatient Prescriptions on File Prior to Visit  Medication Sig Dispense Refill  . amLODipine (NORVASC) 5 MG tablet Take 1 tablet (5 mg total) by mouth daily. 90 tablet 3  . rosuvastatin (CRESTOR) 10 MG tablet Take 10 mg by mouth daily.    Marland Kitchen. venlafaxine XR (EFFEXOR-XR) 150 MG 24 hr capsule Take 1 capsule (150 mg total) by mouth daily with breakfast. 90 capsule 3   No current facility-administered medications on file prior to visit.     BP 138/82   Pulse 78   Temp 97.8 F (36.6 C) (Oral)   Ht 5' 8.25" (1.734 m)   Wt 195 lb 12.8 oz (88.8 kg)   SpO2 97%   BMI 29.55 kg/m    Objective:   Physical Exam  Constitutional: She appears well-nourished.  HENT:  Right Ear: External ear normal.  Left Ear: External ear normal.  Mouth/Throat: Oropharynx is clear and moist.  Eyes: Pupils are equal, round, and reactive to light. Right eye exhibits discharge. No foreign body present in the right eye. No foreign body present in the left eye. Right conjunctiva is injected. Right conjunctiva has no hemorrhage. Left conjunctiva is not injected. Left conjunctiva has no hemorrhage.  Neck: Neck supple.  Cardiovascular: Normal rate.   Pulmonary/Chest: Effort normal and breath sounds normal.          Assessment & Plan:  Acute Bacterial Conjunctivitis:  Right eye drainage, irritation. Son recently diagnosed with "pink eye". No fevers, acute changes in vision. Exam today with evidence of likely bacterial conjunctivitis. Rx for Trimethoprim-Polymixin B gtts. Discussed continue use 2 days post resolve. Warm compresses, ibuprofen PRN  discomfort. Follow up PRN.  Morrie Sheldon, NP

## 2016-04-20 NOTE — Progress Notes (Signed)
Pre visit review using our clinic review tool, if applicable. No additional management support is needed unless otherwise documented below in the visit note. 

## 2016-05-16 ENCOUNTER — Encounter: Payer: Self-pay | Admitting: Primary Care

## 2016-06-26 ENCOUNTER — Encounter: Payer: Self-pay | Admitting: Primary Care

## 2016-06-27 ENCOUNTER — Other Ambulatory Visit: Payer: Self-pay | Admitting: Primary Care

## 2016-06-27 DIAGNOSIS — I10 Essential (primary) hypertension: Secondary | ICD-10-CM

## 2016-06-27 DIAGNOSIS — E785 Hyperlipidemia, unspecified: Secondary | ICD-10-CM

## 2016-06-27 MED ORDER — ROSUVASTATIN CALCIUM 10 MG PO TABS: 10.0000 mg | ORAL_TABLET | Freq: Every day | ORAL | 3 refills | Status: DC

## 2016-06-27 MED ORDER — AMLODIPINE BESYLATE 5 MG PO TABS: 5.0000 mg | ORAL_TABLET | Freq: Every day | ORAL | 3 refills | Status: DC

## 2017-03-25 ENCOUNTER — Encounter: Payer: Self-pay | Admitting: Primary Care

## 2017-03-25 DIAGNOSIS — I10 Essential (primary) hypertension: Secondary | ICD-10-CM

## 2017-03-25 DIAGNOSIS — E785 Hyperlipidemia, unspecified: Secondary | ICD-10-CM

## 2017-03-26 MED ORDER — AMLODIPINE BESYLATE 5 MG PO TABS: 5.0000 mg | ORAL_TABLET | Freq: Every day | ORAL | 0 refills | Status: DC

## 2017-03-26 MED ORDER — ROSUVASTATIN CALCIUM 10 MG PO TABS: 10.0000 mg | ORAL_TABLET | Freq: Every day | ORAL | 0 refills | Status: DC

## 2017-04-03 ENCOUNTER — Emergency Department: Payer: BLUE CROSS/BLUE SHIELD

## 2017-04-03 ENCOUNTER — Encounter: Payer: Self-pay | Admitting: Emergency Medicine

## 2017-04-03 ENCOUNTER — Emergency Department
Admission: EM | Admit: 2017-04-03 | Discharge: 2017-04-03 | Disposition: A | Discharge status: Home / Self Care | Payer: BLUE CROSS/BLUE SHIELD | Attending: Emergency Medicine | Admitting: Emergency Medicine

## 2017-04-03 DIAGNOSIS — I10 Essential (primary) hypertension: Secondary | ICD-10-CM | POA: Insufficient documentation

## 2017-04-03 DIAGNOSIS — Z79899 Other long term (current) drug therapy: Secondary | ICD-10-CM | POA: Diagnosis not present

## 2017-04-03 DIAGNOSIS — N12 Tubulo-interstitial nephritis, not specified as acute or chronic: Secondary | ICD-10-CM | POA: Insufficient documentation

## 2017-04-03 DIAGNOSIS — R109 Unspecified abdominal pain: Secondary | ICD-10-CM

## 2017-04-03 DIAGNOSIS — Z91040 Latex allergy status: Secondary | ICD-10-CM | POA: Insufficient documentation

## 2017-04-03 LAB — URINALYSIS, COMPLETE (UACMP) WITH MICROSCOPIC
Bilirubin Urine: NEGATIVE
Glucose, UA: NEGATIVE mg/dL
Ketones, ur: NEGATIVE mg/dL
Nitrite: NEGATIVE
Protein, ur: 100 mg/dL — AB
SPECIFIC GRAVITY, URINE: 1.024 (ref 1.005–1.030)
pH: 5 (ref 5.0–8.0)

## 2017-04-03 LAB — CBC
HCT: 40.2 % (ref 35.0–47.0)
Hemoglobin: 14.4 g/dL (ref 12.0–16.0)
MCH: 32.1 pg (ref 26.0–34.0)
MCHC: 35.8 g/dL (ref 32.0–36.0)
MCV: 89.6 fL (ref 80.0–100.0)
PLATELETS: 300 10*3/uL (ref 150–440)
RBC: 4.49 MIL/uL (ref 3.80–5.20)
RDW: 13.4 % (ref 11.5–14.5)
WBC: 8 10*3/uL (ref 3.6–11.0)

## 2017-04-03 LAB — BASIC METABOLIC PANEL
Anion gap: 8 (ref 5–15)
BUN: 14 mg/dL (ref 6–20)
CALCIUM: 9.8 mg/dL (ref 8.9–10.3)
CO2: 24 mmol/L (ref 22–32)
CREATININE: 0.88 mg/dL (ref 0.44–1.00)
Chloride: 105 mmol/L (ref 101–111)
Glucose, Bld: 82 mg/dL (ref 65–99)
Potassium: 4.1 mmol/L (ref 3.5–5.1)
SODIUM: 137 mmol/L (ref 135–145)

## 2017-04-03 MED ORDER — CEFTRIAXONE SODIUM 1 G IJ SOLR
1.0000 g | Freq: Once | INTRAMUSCULAR | Status: DC
  Administered 2017-04-03: 1 g via INTRAVENOUS

## 2017-04-03 MED ORDER — ONDANSETRON HCL 4 MG/2ML IJ SOLN
4.0000 mg | Freq: Once | INTRAMUSCULAR | Status: AC
  Administered 2017-04-03: 4 mg via INTRAVENOUS
  Filled 2017-04-03: qty 2

## 2017-04-03 MED ORDER — ONDANSETRON HCL 4 MG PO TABS: 4.0000 mg | ORAL_TABLET | Freq: Three times a day (TID) | ORAL | 0 refills | Status: DC | PRN

## 2017-04-03 MED ORDER — KETOROLAC TROMETHAMINE 30 MG/ML IJ SOLN
30.0000 mg | Freq: Once | INTRAMUSCULAR | Status: AC
  Administered 2017-04-03: 30 mg via INTRAVENOUS
  Filled 2017-04-03: qty 1

## 2017-04-03 MED ORDER — DEXTROSE 5 % IV SOLN
INTRAVENOUS | Status: AC
  Administered 2017-04-03: 1 g via INTRAVENOUS
  Filled 2017-04-03: qty 10

## 2017-04-03 MED ORDER — CEFTRIAXONE SODIUM IN DEXTROSE 20 MG/ML IV SOLN: 1.0000 g | Freq: Once | INTRAVENOUS | Status: DC

## 2017-04-03 MED ORDER — OXYCODONE-ACETAMINOPHEN 5-325 MG PO TABS: 1.0000 | ORAL_TABLET | Freq: Four times a day (QID) | ORAL | 0 refills | Status: DC | PRN

## 2017-04-03 MED ORDER — CEPHALEXIN 500 MG PO CAPS: 500.0000 mg | ORAL_CAPSULE | Freq: Three times a day (TID) | ORAL | 0 refills | Status: AC

## 2017-04-03 NOTE — ED Notes (Signed)
Per MAR-rocephin order was discontinued, discussed with Dr. Alphonzo Lemmings who states medication was not discontinued by him and he would like for patient to receive it.  Medication charted as given.

## 2017-04-03 NOTE — ED Notes (Signed)
Patient discharged to home per MD order. Patient in stable condition, and deemed medically cleared by ED provider for discharge. Discharge instructions reviewed with patient/family using "Teach Back"; verbalized understanding of medication education and administration, and information about follow-up care. Denies further concerns. ° °

## 2017-04-03 NOTE — ED Triage Notes (Signed)
Pt reports left side flank pain that began today with associated nausea, frequency and urgency. Denies hematuria. Pt reports history of kidney stone.

## 2017-04-03 NOTE — ED Notes (Signed)
Attempted IV twice with no success.  Patient tolerated well.

## 2017-04-03 NOTE — ED Notes (Signed)
Pt has back pain.  Pt reports having back surgery 7/18 in [place].  No recent injury to back . Hx kidney stones.  Pt reports nausea.  Sx began this am.  No abd pain.  Pt alert.

## 2017-04-03 NOTE — ED Provider Notes (Addendum)
[facility] Emergency Department Provider Note  ____________________________________________   I have reviewed the triage vital signs and the nursing notes.   HISTORY  Chief Complaint Flank Pain    HPI Elaine Young is a 56 y.o. female w hx of diverticulosis, back surgery for l4 disc issue 4 months ago, and renal colic presents today with left-sided flank pain that goes down towards her groin. Began this morning. Gradually worsened. No fever, questionable urinary hesitancy, no frank dysuria, no vomiting or fever.   is moderate, nothing makes it better no prior treatment  Past Medical History:  Diagnosis Date  . Carpal tunnel syndrome   . Cervical disc disorder   . Essential hypertension   . Functional incontinence   . Generalized anxiety disorder   . Hyperlipidemia   . Lower extremity numbness   . Lumbar disc disease   . Migraines   . Seasonal allergies   . Vitamin D deficiency     Patient Active Problem List   Diagnosis Date Noted  . Family history of brain aneurysm 04/03/2016  . Preventative health care 04/03/2016  . Prediabetes 04/03/2016  . Essential hypertension 08/09/2015  . Hyperlipidemia 08/09/2015  . Generalized anxiety disorder 08/09/2015  . Lower extremity numbness 08/09/2015    Past Surgical History:  Procedure Laterality Date  . ABDOMINAL HYSTERECTOMY    . BLADDER SUSPENSION  2005    Prior to Admission medications   Medication Sig Start Date End Date Taking? Authorizing Provider  amLODipine (NORVASC) 5 MG tablet Take 1 tablet (5 mg total) by mouth daily. 03/26/17   Doreene Nest, NP  rosuvastatin (CRESTOR) 10 MG tablet Take 1 tablet (10 mg total) by mouth daily. 03/26/17   Doreene Nest, NP  trimethoprim-polymyxin b (POLYTRIM) ophthalmic solution Place 1 drop into the right eye every 6 (six) hours. 04/20/16   Doreene Nest, NP  venlafaxine XR (EFFEXOR-XR) 150 MG 24 hr capsule Take 1 capsule (150 mg  total) by mouth daily with breakfast. 04/03/16   Doreene Nest, NP    Allergies Codeine; Iodides; Latex; Lipitor [atorvastatin]; Shellfish allergy; and Sulfa antibiotics  Family History  Problem Relation Age of Onset  . Hyperlipidemia Mother   . Ovarian cancer Maternal Aunt   . Lung cancer Maternal Uncle   . Arthritis Maternal Grandmother   . Arthritis Maternal Grandfather   . Colon cancer Maternal Grandfather   . Arthritis Paternal Grandmother   . Hypertension Paternal Grandmother   . Diabetes Paternal Grandmother   . Arthritis Paternal Grandfather   . Diabetes Paternal Grandfather     Social History Social History  Substance Use Topics  . Smoking status: Never Smoker  . Smokeless tobacco: Not on file  . Alcohol use 0.0 oz/week    Review of Systems Constitutional: No fever/chills Eyes: No visual changes. ENT: No sore throat. No stiff neck no neck pain Cardiovascular: Denies chest pain. Respiratory: Denies shortness of breath. Gastrointestinal:   no vomiting.  No diarrhea.  No constipation. Genitourinary: see hpi Musculoskeletal: Negative lower extremity swelling Skin: Negative for rash. Neurological: Negative for severe headaches, focal weakness or numbness.   ____________________________________________   PHYSICAL EXAM:  VITAL SIGNS: ED Triage Vitals  Enc Vitals Group     BP 04/03/17 1606 (!) 161/87     Pulse Rate 04/03/17 1606 92     Resp 04/03/17 1606 18     Temp 04/03/17 1606 98.2 F (36.8 C)     Temp Source 04/03/17 1606 Oral  SpO2 04/03/17 1606 97 %     Weight 04/03/17 1606 200 lb (90.7 kg)     Height 04/03/17 1606  (1.727 m)     Head Circumference --      Peak Flow --      Pain Score 04/03/17 1605 4     Pain Loc --      Pain Edu? --      Excl. in GC? --     Constitutional: Alert and oriented. Well appearing and in no acute distress. Eyes: Conjunctivae are normal Head: Atraumatic HEENT: No congestion/rhinnorhea. Mucous membranes  are moist.  Oropharynx non-erythematous Neck:   Nontender with no meningismus, no masses, no stridor Cardiovascular: Normal rate, regular rhythm. Grossly normal heart sounds.  Good peripheral circulation. Respiratory: Normal respiratory effort.  No retractions. Lungs CTAB. Abdominal: Soft and nominimal llq ntender. No distention. No guarding no rebound Back:  There is no focal tenderness or step off.  there is no midline tenderness there are no lesions noted. there is L flank CVA tenderness Musculoskeletal: No lower extremity tenderness, no upper extremity tenderness. No joint effusions, no DVT signs strong distal pulses no edema Neurologic:  Normal speech and language. No gross focal neurologic deficits are appreciated.  Skin:  Skin is warm, dry and intact. No rash noted. Psychiatric: Mood and affect are normal. Speech and behavior are normal.  ____________________________________________   LABS (all labs ordered are listed, but only abnormal results are displayed)  Labs Reviewed  URINALYSIS, COMPLETE (UACMP) WITH MICROSCOPIC - Abnormal; Notable for the following:       Result Value   Color, Urine AMBER (*)    APPearance CLOUDY (*)    Hgb urine dipstick SMALL (*)    Protein, ur 100 (*)    Leukocytes, UA MODERATE (*)    Squamous Epithelial / LPF TOO NUMEROUS TO COUNT (*)    Bacteria, UA FEW (*)    All other components within normal limits  CBC  BASIC METABOLIC PANEL    Pertinent labs  results that were available during my care of the patient were reviewed by me and considered in my medical decision making (see chart for details). ____________________________________________  EKG  I personally interpreted any EKGs ordered by me or triage  ____________________________________________  RADIOLOGY  Pertinent labs & imaging results that were available during my care of the patient were reviewed by me and considered in my medical decision making (see chart for details). If  possible, patient and/or family made aware of any abnormal findings. ____________________________________________    PROCEDURES  Procedure(s) performed: None  Procedures  Critical Care performed: None  ____________________________________________   INITIAL IMPRESSION / ASSESSMENT AND PLAN / ED COURSE  Pertinent labs & imaging results that were available during my care of the patient were reviewed by me and considered in my medical decision making (see chart for details).  patient with a history of renal colic presents today with left-sided flank pain radiating to her groin, vital signs and blood workup thus far reassuring, CT will be obtained to rule out diverticular other entities and differential. Pyelonephritis is certainly also possible  ----------------------------------------- 6:10 PM on 04/03/2017 -----------------------------------------  lungs most consistent with pyelonephritis, she does have what appears to be infected urine with flank pain and now more pronounced dysuria.patient's pain and symptoms are very reproducible, I think disruptions ACS PE dissection or intrathoracic pathology nor is there evidence of PID clinically, she has no lower abdominal tenderness, mostly flank pain radiating around to  the left lower quadrant. Patient preferred not to have a pelvic exam. She not did discuss the findings of possible foreign body in the abdomen she states it's just because she had a biscotti right before she came in. Unclear if that is the cause of that certainly not contributory to her discomfort in any way, it is an unusual finding will have her follow up with GI. Urine culture has been sent no evidence of urine sepsis, we'll start her on an IV antibiotics and hopefully get her home safely with close outpatient follow-up this is the patient's preference. Return precautions and follow-up given and understood   ____________________________________________   FINAL CLINICAL  IMPRESSION(S) / ED DIAGNOSES  Final diagnoses:  None      This chart was dictated using voice recognition software.  Despite best efforts to proofread,  errors can occur which can change meaning.      Jeanmarie Plant, MD 04/03/17 1733    Jeanmarie Plant, MD 04/03/17 [phone]

## 2017-04-03 NOTE — Discharge Instructions (Signed)
Return to the emergency room for new or worrisome symptoms including increased pain, vomiting, high fever, or any other concerns. Follow up closely with pcp. Do not drive on percocet. Radiology noted a possible foreign body in your stomach. This may have just been the food you ate prior to coming in, but we ask that you follow up closely with your pcp about it. If you have vomiting or bleeding or other concerns please return to the er.

## 2017-04-03 NOTE — ED Notes (Signed)
Patient transported to CT 

## 2017-04-05 LAB — URINE CULTURE: Culture: NO GROWTH

## 2017-04-15 ENCOUNTER — Encounter: Payer: Self-pay | Admitting: Primary Care

## 2017-04-15 ENCOUNTER — Ambulatory Visit (INDEPENDENT_AMBULATORY_CARE_PROVIDER_SITE_OTHER): Payer: BLUE CROSS/BLUE SHIELD | Admitting: Primary Care

## 2017-04-15 VITALS — BP 124/82 | HR 81 | Temp 98.5°F | Ht 68.75 in | Wt 201.0 lb

## 2017-04-15 DIAGNOSIS — R7303 Prediabetes: Secondary | ICD-10-CM | POA: Diagnosis not present

## 2017-04-15 DIAGNOSIS — E785 Hyperlipidemia, unspecified: Secondary | ICD-10-CM | POA: Diagnosis not present

## 2017-04-15 DIAGNOSIS — R109 Unspecified abdominal pain: Secondary | ICD-10-CM | POA: Diagnosis not present

## 2017-04-15 DIAGNOSIS — F411 Generalized anxiety disorder: Secondary | ICD-10-CM

## 2017-04-15 DIAGNOSIS — Z23 Encounter for immunization: Secondary | ICD-10-CM

## 2017-04-15 DIAGNOSIS — I10 Essential (primary) hypertension: Secondary | ICD-10-CM

## 2017-04-15 LAB — LIPID PANEL
CHOLESTEROL: 179 mg/dL (ref 0–200)
HDL: 52.1 mg/dL (ref >39.00)
LDL Cholesterol: 94 mg/dL (ref 0–99)
NONHDL: 127.36
Total CHOL/HDL Ratio: 3
Triglycerides: 169 mg/dL — ABNORMAL HIGH (ref 0.0–149.0)
VLDL: 33.8 mg/dL (ref 0.0–40.0)

## 2017-04-15 LAB — CBC WITH DIFFERENTIAL/PLATELET
BASOS PCT: 0.1 % (ref 0.0–3.0)
Basophils Absolute: 0 10*3/uL (ref 0.0–0.1)
EOS ABS: 0 10*3/uL (ref 0.0–0.7)
EOS PCT: 0.1 % (ref 0.0–5.0)
HCT: 40.9 % (ref 36.0–46.0)
HEMOGLOBIN: 13.9 g/dL (ref 12.0–15.0)
LYMPHS ABS: 2.1 10*3/uL (ref 0.7–4.0)
Lymphocytes Relative: 37.4 % (ref 12.0–46.0)
MCHC: 34 g/dL (ref 30.0–36.0)
MCV: 92.2 fl (ref 78.0–100.0)
MONO ABS: 0.5 10*3/uL (ref 0.1–1.0)
Monocytes Relative: 8.5 % (ref 3.0–12.0)
Neutro Abs: 3 10*3/uL (ref 1.4–7.7)
Neutrophils Relative %: 53.9 % (ref 43.0–77.0)
Platelets: 288 10*3/uL (ref 150.0–400.0)
RBC: 4.43 Mil/uL (ref 3.87–5.11)
RDW: 13.3 % (ref 11.5–15.5)
WBC: 5.5 10*3/uL (ref 4.0–10.5)

## 2017-04-15 LAB — HEPATIC FUNCTION PANEL
ALT: 43 U/L — AB (ref 0–35)
AST: 23 U/L (ref 0–37)
Albumin: 4.6 g/dL (ref 3.5–5.2)
Alkaline Phosphatase: 69 U/L (ref 39–117)
BILIRUBIN DIRECT: 0.1 mg/dL (ref 0.0–0.3)
BILIRUBIN TOTAL: 0.5 mg/dL (ref 0.2–1.2)
Total Protein: 7.5 g/dL (ref 6.0–8.3)

## 2017-04-15 LAB — HEMOGLOBIN A1C: HEMOGLOBIN A1C: 5.7 % (ref 4.6–6.5)

## 2017-04-15 MED ORDER — KETOROLAC TROMETHAMINE 10 MG PO TABS: 10.0000 mg | ORAL_TABLET | Freq: Three times a day (TID) | ORAL | 0 refills | Status: DC | PRN

## 2017-04-15 MED ORDER — KETOROLAC TROMETHAMINE 60 MG/2ML IM SOLN
60.0000 mg | Freq: Once | INTRAMUSCULAR | Status: AC
  Administered 2017-04-15: 60 mg via INTRAMUSCULAR

## 2017-04-15 MED ORDER — ROSUVASTATIN CALCIUM 10 MG PO TABS: 10.0000 mg | ORAL_TABLET | Freq: Every day | ORAL | 3 refills | Status: DC

## 2017-04-15 MED ORDER — AMLODIPINE BESYLATE 5 MG PO TABS: 5.0000 mg | ORAL_TABLET | Freq: Every day | ORAL | 3 refills | Status: DC

## 2017-04-15 MED ORDER — VENLAFAXINE HCL ER 150 MG PO CP24: 150.0000 mg | ORAL_CAPSULE | Freq: Every day | ORAL | 3 refills | Status: DC

## 2017-04-15 NOTE — Assessment & Plan Note (Signed)
Repeat lipids pending.  

## 2017-04-15 NOTE — Assessment & Plan Note (Signed)
Stable in the office today, continue Amlodipine 5 mg.  Recent BMP unremarkable.

## 2017-04-15 NOTE — Patient Instructions (Signed)
Complete lab work prior to leaving today. I will notify you of your results once received.   You were provided with an injection of Toradol 60 mg today. Do not take any Toradol, Ibuprofen, Advil today.  You may take the oral Toradol every 8 hours for a maximum of 5 days starting tomorrow. Avoid any medications such as Advil, Ibuprofen, naproxen while taking Toradol.  Please update me in 1 week if no improvement.   I sent refills of your medications to your pharmacy.  It was a pleasure to see you today!

## 2017-04-15 NOTE — Assessment & Plan Note (Signed)
-   Repeat A1C today
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
## 2017-04-15 NOTE — Addendum Note (Signed)
Addended by: Tawnya Crook on: 04/15/2017 03:05 PM   Modules accepted: Orders

## 2017-04-15 NOTE — Assessment & Plan Note (Signed)
Doing well on Venlafaxine, refills provided today.

## 2017-04-23 ENCOUNTER — Encounter: Payer: Self-pay | Admitting: Primary Care

## 2017-05-16 ENCOUNTER — Ambulatory Visit (INDEPENDENT_AMBULATORY_CARE_PROVIDER_SITE_OTHER): Payer: BLUE CROSS/BLUE SHIELD | Admitting: Primary Care

## 2017-05-16 ENCOUNTER — Encounter: Payer: Self-pay | Admitting: Primary Care

## 2017-05-16 VITALS — BP 124/82 | HR 84 | Temp 97.7°F | Ht 68.75 in | Wt 198.0 lb

## 2017-05-16 DIAGNOSIS — I10 Essential (primary) hypertension: Secondary | ICD-10-CM | POA: Diagnosis not present

## 2017-05-16 DIAGNOSIS — Z0000 Encounter for general adult medical examination without abnormal findings: Secondary | ICD-10-CM | POA: Diagnosis not present

## 2017-05-16 DIAGNOSIS — F411 Generalized anxiety disorder: Secondary | ICD-10-CM | POA: Diagnosis not present

## 2017-05-16 DIAGNOSIS — R7303 Prediabetes: Secondary | ICD-10-CM | POA: Diagnosis not present

## 2017-05-16 DIAGNOSIS — E785 Hyperlipidemia, unspecified: Secondary | ICD-10-CM | POA: Diagnosis not present

## 2017-05-16 NOTE — Progress Notes (Signed)
Subjective:    Patient ID: Elaine PallJudith Marie Young, female    DOB: 09-10-1960, 56 y.o.   MRN: [id number]  HPI  Elaine Young is a 56 year old female who presents today for complete physical.  Immunizations: -Tetanus: Completed in 2017 -Influenza: Completed this season   Diet:  She endorses a healthy diet Breakfast: Eggs Lunch: Salad mostly Dinner: Meat, vegetables Snacks: Biscotti, fruit, sugar free jello Desserts: None Beverages: Some water with flavor, hot tea  Exercise: She is not currently exercising, but plans on starting to restart walking. Eye exam: Due, will schedule Dental exam: Completes semi-annually Colonoscopy: Never completed, interested in Cologuard Pap Smear: Hysterectomy  Mammogram: Pending, she will schedule   Review of Systems  Constitutional: Negative for unexpected weight change.  HENT: Negative for rhinorrhea.   Respiratory: Negative for cough and shortness of breath.   Cardiovascular: Negative for chest pain.  Gastrointestinal: Negative for constipation and diarrhea.  Genitourinary: Negative for difficulty urinating and menstrual problem.  Musculoskeletal: Negative for arthralgias.       Continues to experience left flank pain, overall improved  Skin: Negative for rash.  Allergic/Immunologic: Negative for environmental allergies.  Neurological: Negative for dizziness, numbness and headaches.       Past Medical History:  Diagnosis Date  . Carpal tunnel syndrome   . Cervical disc disorder   . Essential hypertension   . Functional incontinence   . Generalized anxiety disorder   . Hyperlipidemia   . Lower extremity numbness   . Lumbar disc disease   . Migraines   . Seasonal allergies   . Vitamin D deficiency      Social History   Socioeconomic History  . Marital status: Married    Spouse name: Not on file  . Number of children: Not on file  . Years of education: Not on file  . Highest education level: Not on file  Social Needs  .  Financial resource strain: Not on file  . Food insecurity - worry: Not on file  . Food insecurity - inability: Not on file  . Transportation needs - medical: Not on file  . Transportation needs - non-medical: Not on file  Occupational History  . Not on file  Tobacco Use  . Smoking status: Never Smoker  . Smokeless tobacco: Never Used  Substance and Sexual Activity  . Alcohol use: Yes    Alcohol/week: 0.0 oz  . Drug use: No  . Sexual activity: Not on file  Other Topics Concern  . Not on file  Social History Narrative   Married.   1 child. 1 grandchild.   Retired.    Enjoys reading.     Past Surgical History:  Procedure Laterality Date  . ABDOMINAL HYSTERECTOMY    . BLADDER SUSPENSION  2005    Family History  Problem Relation Age of Onset  . Hyperlipidemia Mother   . Ovarian cancer Maternal Aunt   . Lung cancer Maternal Uncle   . Arthritis Maternal Grandmother   . Arthritis Maternal Grandfather   . Colon cancer Maternal Grandfather   . Arthritis Paternal Grandmother   . Hypertension Paternal Grandmother   . Diabetes Paternal Grandmother   . Arthritis Paternal Grandfather   . Diabetes Paternal Grandfather     Allergies  Allergen Reactions  . Codeine   . Iodides   . Latex   . Lipitor [Atorvastatin]   . Shellfish Allergy Swelling and Rash  . Sulfa Antibiotics Rash    Current Outpatient Medications on File  Prior to Visit  Medication Sig Dispense Refill  . amLODipine (NORVASC) 5 MG tablet Take 1 tablet (5 mg total) by mouth daily. 90 tablet 3  . rosuvastatin (CRESTOR) 10 MG tablet Take 1 tablet (10 mg total) by mouth daily. 90 tablet 3  . venlafaxine XR (EFFEXOR-XR) 150 MG 24 hr capsule Take 1 capsule (150 mg total) by mouth daily with breakfast. 90 capsule 3   No current facility-administered medications on file prior to visit.     BP 124/82   Pulse 84   Temp 97.7 F (36.5 C) (Oral)   Ht 5' 8.75" (1.746 m)   Wt 198 lb (89.8 kg)   SpO2 97%   BMI 29.45  kg/m    Objective:   Physical Exam  Constitutional: She is oriented to person, place, and time. She appears well-nourished.  HENT:  Right Ear: Tympanic membrane and ear canal normal.  Left Ear: Tympanic membrane and ear canal normal.  Nose: Nose normal.  Mouth/Throat: Oropharynx is clear and moist.  Eyes: Conjunctivae and EOM are normal. Pupils are equal, round, and reactive to light.  Neck: Neck supple. No thyromegaly present.  Cardiovascular: Normal rate and regular rhythm.  No murmur heard. Pulmonary/Chest: Effort normal and breath sounds normal. She has no rales.  Abdominal: Soft. Bowel sounds are normal. There is no tenderness.  Musculoskeletal: Normal range of motion.  Lymphadenopathy:    She has no cervical adenopathy.  Neurological: She is alert and oriented to person, place, and time. She has normal reflexes. No cranial nerve deficit.  Skin: Skin is warm and dry. No rash noted.  Psychiatric: She has a normal mood and affect.          Assessment & Plan:

## 2017-05-16 NOTE — Assessment & Plan Note (Signed)
Doing well on Effexor, continue same.

## 2017-05-16 NOTE — Assessment & Plan Note (Signed)
Recent lipid panel stable, continue rosuvastatin. LFT's unremarkable.

## 2017-05-16 NOTE — Patient Instructions (Signed)
Call the [facility] to schedule your mammogram.  Complete the Cologuard Kit, I will notify you of your results once received.  Continue exercising. You should be getting 150 minutes of moderate intensity exercise weekly.  Continue to work on improvements in your diet. Keep up the great work!  Follow up in 1 year for your annual exam or sooner if needed.  It was a pleasure to see you today!

## 2017-05-16 NOTE — Assessment & Plan Note (Signed)
Stable in the office today, recent BMP unremarkable.  Continue current regimen. 

## 2017-05-16 NOTE — Assessment & Plan Note (Signed)
Recent A1C of 5.7. Encouraged continued improvement in diet and to start regular exercise.

## 2017-05-16 NOTE — Assessment & Plan Note (Signed)
Immunizations UTD. Mammogram due, pending. Colon cancer screening due, she opts for cologuard. Ordered and now pending. Commended her on changes in diet, encouraged regular exercise. Exam unremarkable. Labs unremarkable. Follow up in 1 year.

## 2017-05-27 ENCOUNTER — Other Ambulatory Visit: Payer: Self-pay | Admitting: Primary Care

## 2017-05-27 DIAGNOSIS — Z1231 Encounter for screening mammogram for malignant neoplasm of breast: Secondary | ICD-10-CM

## 2017-06-14 ENCOUNTER — Ambulatory Visit
Admission: RE | Admit: 2017-06-14 | Discharge: 2017-06-14 | Disposition: A | Discharge status: Home / Self Care | Payer: BLUE CROSS/BLUE SHIELD | Source: Ambulatory Visit | Attending: Primary Care | Admitting: Primary Care

## 2017-06-14 DIAGNOSIS — Z1231 Encounter for screening mammogram for malignant neoplasm of breast: Secondary | ICD-10-CM | POA: Diagnosis not present

## 2017-12-24 ENCOUNTER — Other Ambulatory Visit: Payer: Self-pay | Admitting: Nurse Practitioner

## 2017-12-24 DIAGNOSIS — Z9889 Other specified postprocedural states: Secondary | ICD-10-CM

## 2017-12-24 DIAGNOSIS — Z803 Family history of malignant neoplasm of breast: Secondary | ICD-10-CM

## 2017-12-24 DIAGNOSIS — Z1239 Encounter for other screening for malignant neoplasm of breast: Secondary | ICD-10-CM

## 2018-01-06 DIAGNOSIS — M5416 Radiculopathy, lumbar region: Secondary | ICD-10-CM | POA: Diagnosis not present

## 2018-01-31 DIAGNOSIS — M5416 Radiculopathy, lumbar region: Secondary | ICD-10-CM | POA: Diagnosis not present

## 2018-02-10 ENCOUNTER — Ambulatory Visit: Payer: Self-pay | Admitting: *Deleted
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
## 2018-02-11 NOTE — Telephone Encounter (Signed)
Noted, will see her Thursday as scheduled.

## 2018-02-11 NOTE — Telephone Encounter (Signed)
Unable to reach pt to see if wants to see another provider sooner.

## 2018-02-13 ENCOUNTER — Ambulatory Visit: Payer: BLUE CROSS/BLUE SHIELD | Admitting: Primary Care

## 2018-02-13 ENCOUNTER — Encounter: Payer: Self-pay | Admitting: Primary Care

## 2018-02-13 VITALS — BP 118/72 | HR 92 | Temp 98.0°F | Ht 68.75 in | Wt 199.5 lb

## 2018-02-13 DIAGNOSIS — T24202A Burn of second degree of unspecified site of left lower limb, except ankle and foot, initial encounter: Secondary | ICD-10-CM

## 2018-02-13 DIAGNOSIS — X131XXA Other contact with steam and other hot vapors, initial encounter: Secondary | ICD-10-CM

## 2018-02-13 MED ORDER — SILVER SULFADIAZINE 1 % EX CREA: 1.0000 "application " | TOPICAL_CREAM | Freq: Every day | CUTANEOUS | 0 refills | Status: DC
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
## 2018-02-13 NOTE — Patient Instructions (Addendum)
Apply the silver sulfadiazine cream once daily to the burn. Make sure to re-apply if the cream comes off. Continue until healed.  Please update me in 2 weeks.  It was a pleasure to see you today!

## 2018-02-14 DIAGNOSIS — T300 Burn of unspecified body region, unspecified degree: Secondary | ICD-10-CM

## 2018-02-14 DIAGNOSIS — M79672 Pain in left foot: Secondary | ICD-10-CM

## 2018-02-15 ENCOUNTER — Other Ambulatory Visit: Payer: Self-pay

## 2018-02-15 ENCOUNTER — Emergency Department: Payer: BLUE CROSS/BLUE SHIELD

## 2018-02-15 DIAGNOSIS — Z91040 Latex allergy status: Secondary | ICD-10-CM | POA: Diagnosis not present

## 2018-02-15 DIAGNOSIS — Z79899 Other long term (current) drug therapy: Secondary | ICD-10-CM | POA: Diagnosis not present

## 2018-02-15 DIAGNOSIS — S9032XA Contusion of left foot, initial encounter: Secondary | ICD-10-CM | POA: Insufficient documentation

## 2018-02-15 DIAGNOSIS — I10 Essential (primary) hypertension: Secondary | ICD-10-CM | POA: Diagnosis not present

## 2018-02-15 DIAGNOSIS — Y999 Unspecified external cause status: Secondary | ICD-10-CM | POA: Insufficient documentation

## 2018-02-15 DIAGNOSIS — S99922A Unspecified injury of left foot, initial encounter: Secondary | ICD-10-CM | POA: Diagnosis not present

## 2018-02-15 DIAGNOSIS — Y92010 Kitchen of single-family (private) house as the place of occurrence of the external cause: Secondary | ICD-10-CM | POA: Insufficient documentation

## 2018-02-15 DIAGNOSIS — M19072 Primary osteoarthritis, left ankle and foot: Secondary | ICD-10-CM | POA: Diagnosis not present

## 2018-02-15 DIAGNOSIS — Y93G3 Activity, cooking and baking: Secondary | ICD-10-CM | POA: Insufficient documentation

## 2018-02-15 DIAGNOSIS — W01198A Fall on same level from slipping, tripping and stumbling with subsequent striking against other object, initial encounter: Secondary | ICD-10-CM | POA: Insufficient documentation
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
## 2018-02-15 NOTE — ED Notes (Signed)
Pt to xray via wheelchair

## 2018-02-16 ENCOUNTER — Emergency Department
Admission: EM | Admit: 2018-02-16 | Discharge: 2018-02-16 | Disposition: A | Discharge status: Home / Self Care | Payer: BLUE CROSS/BLUE SHIELD | Attending: Emergency Medicine | Admitting: Emergency Medicine

## 2018-02-16 DIAGNOSIS — S9032XA Contusion of left foot, initial encounter: Secondary | ICD-10-CM

## 2018-02-16 DIAGNOSIS — M19072 Primary osteoarthritis, left ankle and foot: Secondary | ICD-10-CM | POA: Diagnosis not present

## 2018-02-16 MED ORDER — KETOROLAC TROMETHAMINE 60 MG/2ML IM SOLN
60.0000 mg | Freq: Once | INTRAMUSCULAR | Status: AC
  Administered 2018-02-16: 60 mg via INTRAMUSCULAR
  Filled 2018-02-16: qty 2

## 2018-02-16 MED ORDER — KETOROLAC TROMETHAMINE 30 MG/ML IJ SOLN
30.0000 mg | Freq: Once | INTRAMUSCULAR | Status: DC
  Filled 2018-02-16: qty 1

## 2018-02-16 MED ORDER — TRAMADOL HCL 50 MG PO TABS: 50.0000 mg | ORAL_TABLET | Freq: Four times a day (QID) | ORAL | 0 refills | Status: DC | PRN

## 2018-02-16 MED ORDER — TRAMADOL HCL 50 MG PO TABS
50.0000 mg | ORAL_TABLET | Freq: Once | ORAL | Status: AC
Start: 2018-02-16 — End: 2018-02-16
  Administered 2018-02-16: 50 mg via ORAL
  Filled 2018-02-16: qty 1

## 2018-02-16 NOTE — ED Notes (Signed)
Pt states that last Monday she injured her left foot. She cannot bare any weight on the Left foot at all. Family at bedside.

## 2018-02-16 NOTE — Discharge Instructions (Signed)
Please follow up with orthopedic surgery for further evaluation of your symptoms

## 2018-02-16 NOTE — ED Provider Notes (Signed)
[facility] Emergency Department Provider Note   ____________________________________________   First MD Initiated Contact with Patient 02/16/18 [phone]     (approximate)  I have reviewed the triage vital signs and the nursing notes.   HISTORY  Chief Complaint Foot Pain    HPI Elaine Young is a 57 y.o. female who comes into the hospital today with some foot pain.  The patient states that one week ago she was taking food out of a microwave and it fell onto the floor.  The patient states that she slipped in the food and fell.  She was sore initially but she states that now she cannot put any weight on her left foot.  The patient has been taking Advil for her pain.  She reports that the pain is at the top of her foot.  She is unsure exactly how she fell.  She rates her pain an 8 out of 10 in intensity.  The patient is here today for evaluation.   Past Medical History:  Diagnosis Date  . Carpal tunnel syndrome   . Cervical disc disorder   . Essential hypertension   . Functional incontinence   . Generalized anxiety disorder   . Hyperlipidemia   . Lower extremity numbness   . Lumbar disc disease   . Migraines   . Seasonal allergies   . Vitamin D deficiency     Patient Active Problem List   Diagnosis Date Noted  . Family history of brain aneurysm 04/03/2016  . Preventative health care 04/03/2016  . Prediabetes 04/03/2016  . Essential hypertension 08/09/2015  . Hyperlipidemia 08/09/2015  . Generalized anxiety disorder 08/09/2015  . Lower extremity numbness 08/09/2015    Past Surgical History:  Procedure Laterality Date  . ABDOMINAL HYSTERECTOMY    . BLADDER SUSPENSION  2005  . BREAST EXCISIONAL BIOPSY Right 2000   neg  . BREAST EXCISIONAL BIOPSY Left 2000   neg    Prior to Admission medications   Medication Sig Start Date End Date Taking? Authorizing Provider  amLODipine (NORVASC) 5 MG tablet Take 1 tablet (5 mg total) by mouth daily.  04/15/17   Doreene Nest, NP  rosuvastatin (CRESTOR) 10 MG tablet Take 1 tablet (10 mg total) by mouth daily. 04/15/17   Doreene Nest, NP  silver sulfADIAZINE (SILVADENE) 1 % cream Apply 1 application topically daily. 02/13/18   Doreene Nest, NP  traMADol (ULTRAM) 50 MG tablet Take 1 tablet (50 mg total) by mouth every 6 (six) hours as needed. 02/16/18   Rebecka Apley, MD  venlafaxine XR (EFFEXOR-XR) 150 MG 24 hr capsule Take 1 capsule (150 mg total) by mouth daily with breakfast. 04/15/17   Doreene Nest, NP    Allergies Codeine; Iodides; Latex; Lipitor [atorvastatin]; Shellfish allergy; and Sulfa antibiotics  Family History  Problem Relation Age of Onset  . Hyperlipidemia Mother   . Ovarian cancer Maternal Aunt   . Lung cancer Maternal Uncle   . Arthritis Maternal Grandmother   . Arthritis Maternal Grandfather   . Colon cancer Maternal Grandfather   . Arthritis Paternal Grandmother   . Hypertension Paternal Grandmother   . Diabetes Paternal Grandmother   . Arthritis Paternal Grandfather   . Diabetes Paternal Grandfather     Social History Social History   Tobacco Use  . Smoking status: Never Smoker  . Smokeless tobacco: Never Used  Substance Use Topics  . Alcohol use: Yes    Alcohol/week: 0.0 standard drinks  .  Drug use: No    Review of Systems  Constitutional: No fever/chills Eyes: No visual changes. ENT: No sore throat. Cardiovascular: Denies chest pain. Respiratory: Denies shortness of breath. Gastrointestinal: No abdominal pain.   Genitourinary: Negative for dysuria. Musculoskeletal: Left foot pain Skin: Negative for rash. Neurological: Negative for headaches, focal weakness or numbness.   ____________________________________________   PHYSICAL EXAM:  VITAL SIGNS: ED Triage Vitals  Enc Vitals Group     BP 02/15/18 2329 (!) 165/87     Pulse Rate 02/15/18 2329 97     Resp 02/15/18 2329 18     Temp 02/15/18 2329 98.6 F (37 C)      Temp src --      SpO2 02/15/18 2329 97 %     Weight --      Height --      Head Circumference --      Peak Flow --      Pain Score 02/15/18 2330 8     Pain Loc --      Pain Edu? --      Excl. in GC? --     Constitutional: Alert and oriented. Well appearing and in moderate distress. Eyes: Conjunctivae are normal. PERRL. EOMI. Head: Atraumatic. Nose: No congestion/rhinnorhea. Mouth/Throat: Mucous membranes are moist.  Oropharynx non-erythematous. Cardiovascular: Normal rate, regular rhythm. Grossly normal heart sounds.  Good peripheral circulation. Respiratory: Normal respiratory effort.  No retractions. Lungs CTAB. Gastrointestinal: Soft and nontender. No distention.  Positive bowel sounds Musculoskeletal: Tenderness to palpation of top of left foot with no bruising Neurologic:  Normal speech and language.  Skin:  Skin is warm, dry ulcerated area to foot near toes which patient reports is a burn treated by primary care physician mild erythema with no streaking no warmth. Psychiatric: Mood and affect are normal.   ____________________________________________   LABS (all labs ordered are listed, but only abnormal results are displayed)  Labs Reviewed - No data to display ____________________________________________  EKG  none ____________________________________________  RADIOLOGY  ED MD interpretation: Left foot x-ray: No acute osseous abnormality  Official radiology report(s): Dg Foot Complete Left  Result Date: 02/16/2018 CLINICAL DATA:  Patient with left lower extremity burn. Patient status post fall. Initial encounter. EXAM: LEFT FOOT - COMPLETE 3+ VIEW COMPARISON:  None. FINDINGS: Normal anatomic alignment. No evidence for acute fracture or dislocation. Regional soft tissues are unremarkable. IMPRESSION: No acute osseous abnormality. Electronically Signed   By: Annia Beltrew  Davis M.D.   On: 02/16/2018 00:23     ____________________________________________   PROCEDURES  Procedure(s) performed: None  Procedures  Critical Care performed: No  ____________________________________________   INITIAL IMPRESSION / ASSESSMENT AND PLAN / ED COURSE  As part of my medical decision making, I reviewed the following data within the electronic MEDICAL RECORD NUMBER Notes from prior ED visits and [place] Controlled Substance Database   This is a 57 year old female who comes into the hospital today with some left foot pain.  She reports that she fell and injured her foot 1 week ago but reports that the pain is persistent.  The patient did place some ice to it and has been taking ibuprofen without any relief.  We sent the patient for an x-ray which did not show any fracture or osseous abnormality.  I will give the patient a shot of Toradol and a dose of tramadol.  We will also place the patient in a postop shoe and give her some crutches.  The patient should follow-up with her primary care  physician for further evaluation.      ____________________________________________   FINAL CLINICAL IMPRESSION(S) / ED DIAGNOSES  Final diagnoses:  Contusion of left foot, initial encounter     ED Discharge Orders         Ordered    traMADol (ULTRAM) 50 MG tablet  Every 6 hours PRN     02/16/18 0149           Note:  This document was prepared using Dragon voice recognition software and may include unintentional dictation errors.    Rebecka ApleyWebster, Nilson Tabora P, MD 02/16/18 [phone]

## 2018-02-19 DIAGNOSIS — T24032A Burn of unspecified degree of left lower leg, initial encounter: Secondary | ICD-10-CM | POA: Diagnosis not present

## 2018-02-19 DIAGNOSIS — T25022A Burn of unspecified degree of left foot, initial encounter: Secondary | ICD-10-CM | POA: Diagnosis not present

## 2018-02-27 ENCOUNTER — Other Ambulatory Visit: Payer: Self-pay | Admitting: Primary Care

## 2018-02-27 DIAGNOSIS — F411 Generalized anxiety disorder: Secondary | ICD-10-CM

## 2018-02-27 DIAGNOSIS — E785 Hyperlipidemia, unspecified: Secondary | ICD-10-CM

## 2018-02-28 ENCOUNTER — Other Ambulatory Visit: Payer: Self-pay | Admitting: Primary Care

## 2018-02-28 DIAGNOSIS — I10 Essential (primary) hypertension: Secondary | ICD-10-CM

## 2018-03-18 DIAGNOSIS — M5416 Radiculopathy, lumbar region: Secondary | ICD-10-CM | POA: Diagnosis not present

## 2018-03-18 DIAGNOSIS — M5136 Other intervertebral disc degeneration, lumbar region: Secondary | ICD-10-CM | POA: Diagnosis not present

## 2018-03-18 DIAGNOSIS — M48061 Spinal stenosis, lumbar region without neurogenic claudication: Secondary | ICD-10-CM | POA: Diagnosis not present

## 2018-04-02 DIAGNOSIS — M5416 Radiculopathy, lumbar region: Secondary | ICD-10-CM | POA: Diagnosis not present

## 2018-04-18 DIAGNOSIS — M5137 Other intervertebral disc degeneration, lumbosacral region: Secondary | ICD-10-CM | POA: Diagnosis not present

## 2018-04-22 DIAGNOSIS — M5137 Other intervertebral disc degeneration, lumbosacral region: Secondary | ICD-10-CM | POA: Diagnosis not present

## 2018-04-24 ENCOUNTER — Other Ambulatory Visit: Payer: Self-pay | Admitting: Primary Care

## 2018-04-24 DIAGNOSIS — M5137 Other intervertebral disc degeneration, lumbosacral region: Secondary | ICD-10-CM | POA: Diagnosis not present

## 2018-04-24 DIAGNOSIS — E785 Hyperlipidemia, unspecified: Secondary | ICD-10-CM

## 2018-04-24 DIAGNOSIS — F411 Generalized anxiety disorder: Secondary | ICD-10-CM

## 2018-04-24 DIAGNOSIS — I10 Essential (primary) hypertension: Secondary | ICD-10-CM

## 2018-04-28 DIAGNOSIS — M5137 Other intervertebral disc degeneration, lumbosacral region: Secondary | ICD-10-CM | POA: Diagnosis not present

## 2018-04-30 DIAGNOSIS — M5416 Radiculopathy, lumbar region: Secondary | ICD-10-CM | POA: Diagnosis not present

## 2018-05-19 ENCOUNTER — Other Ambulatory Visit: Payer: Self-pay | Admitting: Primary Care

## 2018-05-19 DIAGNOSIS — E785 Hyperlipidemia, unspecified: Secondary | ICD-10-CM

## 2018-05-19 DIAGNOSIS — R7303 Prediabetes: Secondary | ICD-10-CM

## 2018-05-19 DIAGNOSIS — I10 Essential (primary) hypertension: Secondary | ICD-10-CM

## 2018-05-26 ENCOUNTER — Other Ambulatory Visit (INDEPENDENT_AMBULATORY_CARE_PROVIDER_SITE_OTHER): Payer: BLUE CROSS/BLUE SHIELD

## 2018-05-26 DIAGNOSIS — E785 Hyperlipidemia, unspecified: Secondary | ICD-10-CM | POA: Diagnosis not present

## 2018-05-26 DIAGNOSIS — I10 Essential (primary) hypertension: Secondary | ICD-10-CM

## 2018-05-26 DIAGNOSIS — R7303 Prediabetes: Secondary | ICD-10-CM | POA: Diagnosis not present

## 2018-05-26 LAB — COMPREHENSIVE METABOLIC PANEL
ALT: 28 U/L (ref 0–35)
AST: 19 U/L (ref 0–37)
Albumin: 4.6 g/dL (ref 3.5–5.2)
Alkaline Phosphatase: 74 U/L (ref 39–117)
BUN: 11 mg/dL (ref 6–23)
CO2: 27 meq/L (ref 19–32)
Calcium: 9.9 mg/dL (ref 8.4–10.5)
Chloride: 105 mEq/L (ref 96–112)
Creatinine, Ser: 0.74 mg/dL (ref 0.40–1.20)
GFR: 85.58 mL/min (ref >60.00)
GLUCOSE: 134 mg/dL — AB (ref 70–99)
Potassium: 4.3 mEq/L (ref 3.5–5.1)
SODIUM: 140 meq/L (ref 135–145)
Total Bilirubin: 0.5 mg/dL (ref 0.2–1.2)
Total Protein: 7.6 g/dL (ref 6.0–8.3)

## 2018-05-26 LAB — LIPID PANEL
CHOL/HDL RATIO: 3
Cholesterol: 189 mg/dL (ref 0–200)
HDL: 63.4 mg/dL (ref >39.00)
LDL Cholesterol: 100 mg/dL — ABNORMAL HIGH (ref 0–99)
NONHDL: 125.56
Triglycerides: 129 mg/dL (ref 0.0–149.0)
VLDL: 25.8 mg/dL (ref 0.0–40.0)

## 2018-05-26 LAB — HEMOGLOBIN A1C: Hgb A1c MFr Bld: 6 % (ref 4.6–6.5)

## 2018-06-02 ENCOUNTER — Encounter: Payer: Self-pay | Admitting: Primary Care

## 2018-06-02 ENCOUNTER — Ambulatory Visit (INDEPENDENT_AMBULATORY_CARE_PROVIDER_SITE_OTHER): Payer: BLUE CROSS/BLUE SHIELD | Admitting: Primary Care

## 2018-06-02 VITALS — BP 120/70 | HR 87 | Temp 98.1°F | Ht 68.75 in | Wt 204.5 lb

## 2018-06-02 DIAGNOSIS — G8929 Other chronic pain: Secondary | ICD-10-CM | POA: Insufficient documentation

## 2018-06-02 DIAGNOSIS — M545 Low back pain, unspecified: Secondary | ICD-10-CM

## 2018-06-02 DIAGNOSIS — Z0000 Encounter for general adult medical examination without abnormal findings: Secondary | ICD-10-CM

## 2018-06-02 DIAGNOSIS — F419 Anxiety disorder, unspecified: Secondary | ICD-10-CM

## 2018-06-02 DIAGNOSIS — R7303 Prediabetes: Secondary | ICD-10-CM

## 2018-06-02 DIAGNOSIS — E785 Hyperlipidemia, unspecified: Secondary | ICD-10-CM

## 2018-06-02 DIAGNOSIS — F32A Depression, unspecified: Secondary | ICD-10-CM

## 2018-06-02 DIAGNOSIS — F329 Major depressive disorder, single episode, unspecified: Secondary | ICD-10-CM | POA: Diagnosis not present

## 2018-06-02 DIAGNOSIS — Z1239 Encounter for other screening for malignant neoplasm of breast: Secondary | ICD-10-CM

## 2018-06-02 DIAGNOSIS — I10 Essential (primary) hypertension: Secondary | ICD-10-CM

## 2018-06-02 MED ORDER — VENLAFAXINE HCL ER 37.5 MG PO CP24: 37.5000 mg | ORAL_CAPSULE | Freq: Every day | ORAL | 0 refills | Status: DC

## 2018-06-02 NOTE — Patient Instructions (Signed)
Start venlafaxine ER 37.5 mg capsule. Take this with your venlafaxine ER 150 mg capsule.   Please send me an update via my chart in 4 weeks.   Call the [facility] to schedule your mammogram.   Continue exercising. You should be getting 150 minutes of moderate intensity exercise weekly.  It's important to improve your diet by reducing consumption of fast food, fried food, processed snack foods. Increase consumption of fresh vegetables and fruits, whole grains, water.  Ensure you are drinking 64 ounces of water daily.  We will see you in one year for your annual exam or sooner if needed.  It was a pleasure to see you today!   Preventive Care 40-64 Years, Female Preventive care refers to lifestyle choices and visits with your health care provider that can promote health and wellness. What does preventive care include?  A yearly physical exam. This is also called an annual well check.  Dental exams once or twice a year.  Routine eye exams. Ask your health care provider how often you should have your eyes checked.  Personal lifestyle choices, including: ? Daily care of your teeth and gums. ? Regular physical activity. ? Eating a healthy diet. ? Avoiding tobacco and drug use. ? Limiting alcohol use. ? Practicing safe sex. ? Taking low-dose aspirin daily starting at age 63. ? Taking vitamin and mineral supplements as recommended by your health care provider. What happens during an annual well check? The services and screenings done by your health care provider during your annual well check will depend on your age, overall health, lifestyle risk factors, and family history of disease. Counseling Your health care provider may ask you questions about your:  Alcohol use.  Tobacco use.  Drug use.  Emotional well-being.  Home and relationship well-being.  Sexual activity.  Eating habits.  Work and work Statistician.  Method of birth control.  Menstrual  cycle.  Pregnancy history.  Screening You may have the following tests or measurements:  Height, weight, and BMI.  Blood pressure.  Lipid and cholesterol levels. These may be checked every 5 years, or more frequently if you are over 21 years old.  Skin check.  Lung cancer screening. You may have this screening every year starting at age 82 if you have a 30-pack-year history of smoking and currently smoke or have quit within the past 15 years.  Fecal occult blood test (FOBT) of the stool. You may have this test every year starting at age 21.  Flexible sigmoidoscopy or colonoscopy. You may have a sigmoidoscopy every 5 years or a colonoscopy every 10 years starting at age 47.  Hepatitis C blood test.  Hepatitis B blood test.  Sexually transmitted disease (STD) testing.  Diabetes screening. This is done by checking your blood sugar (glucose) after you have not eaten for a while (fasting). You may have this done every 1-3 years.  Mammogram. This may be done every 1-2 years. Talk to your health care provider about when you should start having regular mammograms. This may depend on whether you have a family history of breast cancer.  BRCA-related cancer screening. This may be done if you have a family history of breast, ovarian, tubal, or peritoneal cancers.  Pelvic exam and Pap test. This may be done every 3 years starting at age 75. Starting at age 12, this may be done every 5 years if you have a Pap test in combination with an HPV test.  Bone density scan. This is  done to screen for osteoporosis. You may have this scan if you are at high risk for osteoporosis.  Discuss your test results, treatment options, and if necessary, the need for more tests with your health care provider. Vaccines Your health care provider may recommend certain vaccines, such as:  Influenza vaccine. This is recommended every year.  Tetanus, diphtheria, and acellular pertussis (Tdap, Td) vaccine. You may  need a Td booster every 10 years.  Varicella vaccine. You may need this if you have not been vaccinated.  Zoster vaccine. You may need this after age 70.  Measles, mumps, and rubella (MMR) vaccine. You may need at least one dose of MMR if you were born in 1957 or later. You may also need a second dose.  Pneumococcal 13-valent conjugate (PCV13) vaccine. You may need this if you have certain conditions and were not previously vaccinated.  Pneumococcal polysaccharide (PPSV23) vaccine. You may need one or two doses if you smoke cigarettes or if you have certain conditions.  Meningococcal vaccine. You may need this if you have certain conditions.  Hepatitis A vaccine. You may need this if you have certain conditions or if you travel or work in places where you may be exposed to hepatitis A.  Hepatitis B vaccine. You may need this if you have certain conditions or if you travel or work in places where you may be exposed to hepatitis B.  Haemophilus influenzae type b (Hib) vaccine. You may need this if you have certain conditions.  Talk to your health care provider about which screenings and vaccines you need and how often you need them. This information is not intended to replace advice given to you by your health care provider. Make sure you discuss any questions you have with your health care provider. Document Released: 07/15/2015 Document Revised: 03/07/2016 Document Reviewed: 04/19/2015 Elsevier Interactive Patient Education  [employer].

## 2018-06-02 NOTE — Progress Notes (Signed)
   Subjective:    Patient ID: Elaine Young, female    DOB: 1961/05/01, 57 y.o.   MRN: [id number]  HPI  Ms. Elaine GrosserWishart is a 57 year old female who presents today for complete physical. She'd also like to discuss symptoms of depression, has noticed some increase in anxiety as weel.   Has noticed symptoms of little motivation to do anything, increased laying around. Some thoughts of feeling down. This has been going on for several months. She has overall felt well on venlafaxine and doesn't want to switch medications. She is interested in a dose increase. PHQ 9 score of 21 today. Denies SI/HI.   Immunizations: -Tetanus: Completed in 2017 -Influenza: Due   Diet: She endorses a fair diet. Breakfast: Oatmeal, bagel  Lunch: Skips Dinner: Radio producerHamburger patty, chicken, pasta, hot dog, vegetables, potatoes Snacks: Popcorn  Desserts: Daily  Beverages: Water, diet soda, hot tea  Exercise: She recently joined swim aerobics two weeks ago. She will go 3-4 times weekly on average.  Eye exam: Completed 2 years ago Dental exam: Completes semi-annually  Colonoscopy: Declines. Opts for Cologuard. Pap Smear: Hysterectomy  Mammogram: Due in December 2019 Hep C Screen: Completed  BP Readings from Last 3 Encounters:  06/02/18 120/70  02/16/18 (!) 136/112  02/13/18 118/72      Review of Systems  Constitutional: Negative for unexpected weight change.  HENT: Negative for rhinorrhea.   Respiratory: Negative for cough and shortness of breath.   Cardiovascular: Negative for chest pain.  Gastrointestinal: Negative for constipation and diarrhea.  Genitourinary: Negative for difficulty urinating.  Musculoskeletal: Positive for arthralgias.       Chronic bilateral lower back pain  Skin: Negative for rash.  Allergic/Immunologic: Negative for environmental allergies.  Neurological: Negative for dizziness, numbness and headaches.  Psychiatric/Behavioral:       See HPI       Objective:   Physical  Exam  Constitutional: She is oriented to person, place, and time. She appears well-nourished.  HENT:  Mouth/Throat: No oropharyngeal exudate.  Eyes: Pupils are equal, round, and reactive to light. EOM are normal.  Neck: Neck supple. No thyromegaly present.  Cardiovascular: Normal rate and regular rhythm.  Respiratory: Effort normal and breath sounds normal.  GI: Soft. Bowel sounds are normal. There is no tenderness.  Musculoskeletal:       Lumbar back: She exhibits decreased range of motion and pain. She exhibits no tenderness.       Back:  Chronic bilateral lower back pain  Neurological: She is alert and oriented to person, place, and time.  Skin: Skin is warm and dry.  Psychiatric: She has a normal mood and affect.           Assessment & Plan:

## 2018-06-02 NOTE — Assessment & Plan Note (Addendum)
Increase in anxiety and depression symptoms over the last several months. PHQ 9 score of 21 today. Denies SI/HI.  Will add in 37.5 mg tablet to take with 150 mg tablet. She will update via my chart in 4 weeks.

## 2018-06-02 NOTE — Assessment & Plan Note (Signed)
Immunizations UTD, she will get influenza vaccination after she returns from her cruise.  Mammogram due, ordered. Due for MR of her breasts given family history of breast cancer in mother, this will need to be done in 6 months.  Colon cancer screening overdue. Will sign up for cologuard. Exam unremarkable. Labs reviewed.  Follow up in 1 year for CPE.

## 2018-06-02 NOTE — Assessment & Plan Note (Signed)
Stable in the office today, continue Amlodipine 5 mg.  

## 2018-06-02 NOTE — Assessment & Plan Note (Signed)
Recent lipid panel stable, continue rosuvastatin 10 mg.

## 2018-06-02 NOTE — Assessment & Plan Note (Signed)
Increase in A1C to 6.0. Commended her on recent exercise, encouraged to continue. Discussed to limit snacking and sweets. Repeat A1C in 6 months.

## 2018-06-02 NOTE — Assessment & Plan Note (Signed)
Chronic. Working with Landchiropractor and recent started water aerobics. Continue same.

## 2018-06-09 ENCOUNTER — Encounter: Payer: Self-pay | Admitting: *Deleted

## 2018-06-12 ENCOUNTER — Ambulatory Visit (INDEPENDENT_AMBULATORY_CARE_PROVIDER_SITE_OTHER): Payer: BLUE CROSS/BLUE SHIELD

## 2018-06-12 DIAGNOSIS — Z23 Encounter for immunization: Secondary | ICD-10-CM

## 2018-06-24 ENCOUNTER — Encounter: Payer: Self-pay | Admitting: Primary Care

## 2018-06-24 ENCOUNTER — Ambulatory Visit: Payer: BLUE CROSS/BLUE SHIELD | Admitting: Primary Care

## 2018-06-24 VITALS — BP 118/80 | HR 83 | Temp 98.5°F | Ht 68.75 in | Wt 199.5 lb

## 2018-06-24 DIAGNOSIS — J069 Acute upper respiratory infection, unspecified: Secondary | ICD-10-CM

## 2018-06-24 MED ORDER — BENZONATATE 200 MG PO CAPS: 200.0000 mg | ORAL_CAPSULE | Freq: Three times a day (TID) | ORAL | 0 refills | Status: DC | PRN

## 2018-06-24 NOTE — Progress Notes (Signed)
Subjective:    Patient ID: Elaine Young, female    DOB: 04-21-1961, 57 y.o.   MRN: [id number]  HPI  Elaine Young is a 57 year old female with a history of hypertension who presents today with a chief complaint of cough.  She also reports sore throat, bilateral ear pain, low grade fevers (no recent fever), chest congestion. Her symptoms began 6 days ago. She's been taking Nyquil and Dayquil with some improvement. Her sore throat and cough have improved. She denies sick contacts. She completed a cruise on 06/09/18.   Review of Systems  Constitutional: Negative for fever.  HENT: Positive for congestion, ear pain, sinus pressure and sore throat.   Respiratory: Positive for cough. Negative for shortness of breath.        Past Medical History:  Diagnosis Date  . Carpal tunnel syndrome   . Cervical disc disorder   . Essential hypertension   . Functional incontinence   . Generalized anxiety disorder   . Hyperlipidemia   . Lower extremity numbness   . Lumbar disc disease   . Migraines   . Seasonal allergies   . Vitamin D deficiency      Social History   Socioeconomic History  . Marital status: Married    Spouse name: Not on file  . Number of children: Not on file  . Years of education: Not on file  . Highest education level: Not on file  Occupational History  . Not on file  Social Needs  . Financial resource strain: Not on file  . Food insecurity:    Worry: Not on file    Inability: Not on file  . Transportation needs:    Medical: Not on file    Non-medical: Not on file  Tobacco Use  . Smoking status: Never Smoker  . Smokeless tobacco: Never Used  Substance and Sexual Activity  . Alcohol use: Yes    Alcohol/week: 0.0 standard drinks  . Drug use: No  . Sexual activity: Not on file  Lifestyle  . Physical activity:    Days per week: Not on file    Minutes per session: Not on file  . Stress: Not on file  Relationships  . Social connections:    Talks on  phone: Not on file    Gets together: Not on file    Attends religious service: Not on file    Active member of club or organization: Not on file    Attends meetings of clubs or organizations: Not on file    Relationship status: Not on file  . Intimate partner violence:    Fear of current or ex partner: Not on file    Emotionally abused: Not on file    Physically abused: Not on file    Forced sexual activity: Not on file  Other Topics Concern  . Not on file  Social History Narrative   Married.   1 child. 1 grandchild.   Retired.    Enjoys reading.     Past Surgical History:  Procedure Laterality Date  . ABDOMINAL HYSTERECTOMY    . BLADDER SUSPENSION  2005  . BREAST EXCISIONAL BIOPSY Right 2000   neg  . BREAST EXCISIONAL BIOPSY Left 2000   neg    Family History  Problem Relation Age of Onset  . Hyperlipidemia Mother   . Ovarian cancer Maternal Aunt   . Lung cancer Maternal Uncle   . Arthritis Maternal Grandmother   . Arthritis Maternal Grandfather   .  Colon cancer Maternal Grandfather   . Arthritis Paternal Grandmother   . Hypertension Paternal Grandmother   . Diabetes Paternal Grandmother   . Arthritis Paternal Grandfather   . Diabetes Paternal Grandfather     Allergies  Allergen Reactions  . Codeine   . Iodides   . Latex   . Lipitor [Atorvastatin]   . Shellfish Allergy Swelling and Rash  . Sulfa Antibiotics Rash    Current Outpatient Medications on File Prior to Visit  Medication Sig Dispense Refill  . amLODipine (NORVASC) 5 MG tablet Take 1 tablet (5 mg total) by mouth daily. DUE FOR A PHYSICAL IN NOVEMBER 90 tablet 0  . rosuvastatin (CRESTOR) 10 MG tablet Take 1 tablet (10 mg total) by mouth daily. DUE FOR A PHYSICAL IN NOVEMBER 90 tablet 0  . venlafaxine XR (EFFEXOR XR) 37.5 MG 24 hr capsule Take 1 capsule (37.5 mg total) by mouth daily with breakfast. Take with 150 mg capsule. 90 capsule 0  . venlafaxine XR (EFFEXOR-XR) 150 MG 24 hr capsule Take 1  capsule (150 mg total) by mouth daily with breakfast. DUE FOR A PHYSICAL IN NOVEMBER 90 capsule 0   No current facility-administered medications on file prior to visit.     BP 118/80   Pulse 83   Temp 98.5 F (36.9 C) (Oral)   Ht 5' 8.75" (1.746 m)   Wt 199 lb 8 oz (90.5 kg)   SpO2 95%   BMI 29.68 kg/m    Objective:   Physical Exam  Constitutional: She appears well-nourished. She does not have a sickly appearance. She appears ill.  HENT:  Right Ear: Tympanic membrane and ear canal normal.  Left Ear: Tympanic membrane and ear canal normal.  Nose: Mucosal edema present. Right sinus exhibits maxillary sinus tenderness. Right sinus exhibits no frontal sinus tenderness. Left sinus exhibits maxillary sinus tenderness. Left sinus exhibits no frontal sinus tenderness.  Mouth/Throat: Oropharynx is clear and moist.  Cloudy appearance to bilateral TM's. No erythema.  Neck: Neck supple.  Cardiovascular: Normal rate and regular rhythm.  Respiratory: Effort normal and breath sounds normal. She has no wheezes.  Mild congestion to left upper lobe, cleared with cough. Congested cough noted during exam.  Skin: Skin is warm and dry.           Assessment & Plan:  Viral URI:  Cough, congestion, 1-2 days of low grade fever, symptoms began 6 days ago. Exam today overall benign for bacterial cause. Clear lungs, is getting better slightly. Suspect viral involvement at this point based off of exam and HPI. Will treat with conservative measures and she will call back if no improvement. Rx for Jerilynn Somessalon Perles sent to pharmacy. Discussed Mucinex use. Fluids, rest, return precautions provided.  Doreene NestKatherine K Demauri Advincula, NP

## 2018-06-24 NOTE — Patient Instructions (Signed)
Your symptoms are representative of a viral illness which will resolve on its own over time. Our goal is to treat your symptoms in order to aid your body in the healing process and to make you more comfortable.   You may take Benzonatate capsules for cough. Take 1 capsule by mouth three times daily as needed for cough.  Chest Congestion: Try taking Mucinex (plain). This will help loosen up the mucous in your chest. Ensure you take this medication with a full glass of water.  Please notify me if you develop persistent fevers of 101, notice increased fatigue or weakness, and/or feel worse Friday this week  Increase consumption of water intake and rest.  It was a pleasure to see you today!

## 2018-07-18 ENCOUNTER — Other Ambulatory Visit: Payer: Self-pay | Admitting: Nurse Practitioner

## 2018-07-18 ENCOUNTER — Other Ambulatory Visit: Payer: Self-pay | Admitting: Primary Care

## 2018-07-18 DIAGNOSIS — F411 Generalized anxiety disorder: Secondary | ICD-10-CM

## 2018-07-18 DIAGNOSIS — E785 Hyperlipidemia, unspecified: Secondary | ICD-10-CM

## 2018-07-18 DIAGNOSIS — I10 Essential (primary) hypertension: Secondary | ICD-10-CM

## 2018-07-27 ENCOUNTER — Other Ambulatory Visit: Payer: Self-pay | Admitting: Primary Care

## 2018-07-27 DIAGNOSIS — F419 Anxiety disorder, unspecified: Principal | ICD-10-CM

## 2018-07-27 DIAGNOSIS — F329 Major depressive disorder, single episode, unspecified: Secondary | ICD-10-CM

## 2018-07-27 DIAGNOSIS — F32A Depression, unspecified: Secondary | ICD-10-CM

## 2018-07-29 NOTE — Telephone Encounter (Signed)
Mychart message sent to patient.

## 2018-07-29 NOTE — Telephone Encounter (Signed)
Last prescribed and last office visit on 06/02/2018. No future appointment

## 2018-08-01 NOTE — Telephone Encounter (Signed)
Will you please call patient? See my chart message sent 3 days ago.

## 2018-08-04 ENCOUNTER — Ambulatory Visit
Admission: RE | Admit: 2018-08-04 | Discharge: 2018-08-04 | Disposition: A | Discharge status: Home / Self Care | Payer: BLUE CROSS/BLUE SHIELD | Source: Ambulatory Visit | Attending: Primary Care | Admitting: Primary Care

## 2018-08-04 DIAGNOSIS — Z1231 Encounter for screening mammogram for malignant neoplasm of breast: Secondary | ICD-10-CM | POA: Diagnosis not present

## 2018-08-04 DIAGNOSIS — Z1239 Encounter for other screening for malignant neoplasm of breast: Secondary | ICD-10-CM

## 2018-08-05 NOTE — Telephone Encounter (Signed)
See mychart messages

## 2018-10-12 ENCOUNTER — Other Ambulatory Visit: Payer: Self-pay | Admitting: Primary Care

## 2018-10-12 DIAGNOSIS — I10 Essential (primary) hypertension: Secondary | ICD-10-CM

## 2018-10-12 DIAGNOSIS — E785 Hyperlipidemia, unspecified: Secondary | ICD-10-CM

## 2018-10-20 ENCOUNTER — Other Ambulatory Visit: Payer: Self-pay | Admitting: Primary Care

## 2018-10-20 DIAGNOSIS — F411 Generalized anxiety disorder: Secondary | ICD-10-CM

## 2018-12-17 ENCOUNTER — Ambulatory Visit: Payer: BLUE CROSS/BLUE SHIELD | Admitting: Primary Care

## 2018-12-17 ENCOUNTER — Encounter: Payer: Self-pay | Admitting: Primary Care

## 2018-12-17 ENCOUNTER — Other Ambulatory Visit: Payer: Self-pay

## 2018-12-17 VITALS — BP 122/82 | HR 81 | Temp 97.6°F | Ht 68.75 in | Wt 205.5 lb

## 2018-12-17 DIAGNOSIS — R7303 Prediabetes: Secondary | ICD-10-CM

## 2018-12-17 DIAGNOSIS — R002 Palpitations: Secondary | ICD-10-CM

## 2018-12-17 DIAGNOSIS — Z0184 Encounter for antibody response examination: Secondary | ICD-10-CM | POA: Diagnosis not present

## 2018-12-17 LAB — BASIC METABOLIC PANEL
BUN: 13 mg/dL (ref 6–23)
CO2: 25 mEq/L (ref 19–32)
Calcium: 9.2 mg/dL (ref 8.4–10.5)
Chloride: 105 mEq/L (ref 96–112)
Creatinine, Ser: 0.64 mg/dL (ref 0.40–1.20)
GFR: 95.02 mL/min (ref >60.00)
Glucose, Bld: 107 mg/dL — ABNORMAL HIGH (ref 70–99)
Potassium: 3.7 mEq/L (ref 3.5–5.1)
Sodium: 139 mEq/L (ref 135–145)

## 2018-12-17 LAB — CBC
HCT: 39.5 % (ref 36.0–46.0)
Hemoglobin: 13.6 g/dL (ref 12.0–15.0)
MCHC: 34.3 g/dL (ref 30.0–36.0)
MCV: 90.2 fl (ref 78.0–100.0)
Platelets: 262 10*3/uL (ref 150.0–400.0)
RBC: 4.38 Mil/uL (ref 3.87–5.11)
RDW: 13.5 % (ref 11.5–15.5)
WBC: 5.2 10*3/uL (ref 4.0–10.5)

## 2018-12-17 LAB — TSH: TSH: 2.79 u[IU]/mL (ref 0.35–4.50)

## 2018-12-17 LAB — HEMOGLOBIN A1C: Hgb A1c MFr Bld: 6 % (ref 4.6–6.5)

## 2018-12-17 NOTE — Assessment & Plan Note (Signed)
Repeat A1C pending. Encouraged a healthy diet and regular exercise. 

## 2018-12-17 NOTE — Assessment & Plan Note (Addendum)
Intermittent for the last 2-3 months. Exam today overall unremarkable. ECG today with NSR with rate of 74. No PAC/PVC, ST changes. Questionable t-wave inversion in V2? No prior ECG to compare.  Check labs including TSH, CBC, BMP, A1C. Referral placed to cardiology for further evaluation and potential Holter Monitor evaluation.

## 2018-12-17 NOTE — Patient Instructions (Signed)
Stop by the lab prior to leaving today. I will notify you of your results once received.   You will be contacted regarding your referral to cardiology.  Please let us know if you have not been contacted within one week.   It was a pleasure to see you today!

## 2018-12-17 NOTE — Progress Notes (Signed)
Subjective:    Patient ID: Elaine Young, female    DOB: 03/01/1961, 58 y.o.   MRN: [id number]  HPI  Elaine Young is a 58 year old female with a history of hypertension, hyperlipidemia, anxiety, prediabetes who presents today with a chief complaint of palpitations.  Palpitations will occur 3-4 times weekly which are intermittent and lasting 1 to 30 minutes. She will not have recurrent episodes during the day, only a 1-30 minute occurrence. Symptoms include "the shakes" with a flushed feeling. This will occur when she feels hungry/wihle cooking dinner. She will then eat, lay down, then fall asleep. This will reduce her palpitations. She's then tried eating small meals every 2 hours which will spread the palpitations apart, but palpitations continue to occur.  Symptoms will also occur while resting in bed at night. During her episodes she will get sweaty/nauseated, feel dizzy. She does still experience hot flashes, but these symptoms are different. Overall she feels as though her anxiety is well managed. She denies chest pain, shortness of breath.  She is requesting Covid-19 antibody testing as she feels as though the respiratory symptoms she had in December was Covid-19.  BP Readings from Last 3 Encounters:  12/17/18 122/82  06/24/18 118/80  06/02/18 120/70     Review of Systems  Constitutional: Negative for fever.  Respiratory: Negative for shortness of breath.   Cardiovascular: Positive for palpitations. Negative for chest pain.  Neurological: Negative for headaches.       Dizziness with palpitations        Past Medical History:  Diagnosis Date  . Carpal tunnel syndrome   . Cervical disc disorder   . Essential hypertension   . Functional incontinence   . Generalized anxiety disorder   . Hyperlipidemia   . Lower extremity numbness   . Lumbar disc disease   . Migraines   . Seasonal allergies   . Vitamin D deficiency      Social History   Socioeconomic History  .  Marital status: Married    Spouse name: Not on file  . Number of children: Not on file  . Years of education: Not on file  . Highest education level: Not on file  Occupational History  . Not on file  Social Needs  . Financial resource strain: Not on file  . Food insecurity    Worry: Not on file    Inability: Not on file  . Transportation needs    Medical: Not on file    Non-medical: Not on file  Tobacco Use  . Smoking status: Never Smoker  . Smokeless tobacco: Never Used  Substance and Sexual Activity  . Alcohol use: Yes    Alcohol/week: 0.0 standard drinks  . Drug use: No  . Sexual activity: Not on file  Lifestyle  . Physical activity    Days per week: Not on file    Minutes per session: Not on file  . Stress: Not on file  Relationships  . Social Musicianconnections    Talks on phone: Not on file    Gets together: Not on file    Attends religious service: Not on file    Active member of club or organization: Not on file    Attends meetings of clubs or organizations: Not on file    Relationship status: Not on file  . Intimate partner violence    Fear of current or ex partner: Not on file    Emotionally abused: Not on file  Physically abused: Not on file    Forced sexual activity: Not on file  Other Topics Concern  . Not on file  Social History Narrative   Married.   1 child. 1 grandchild.   Retired.    Enjoys reading.     Past Surgical History:  Procedure Laterality Date  . ABDOMINAL HYSTERECTOMY    . BLADDER SUSPENSION  2005  . BREAST CYST EXCISION Left   . BREAST CYST EXCISION Left   . BREAST CYST EXCISION Left   . BREAST CYST EXCISION Right    hard to see excision area  . BREAST CYST EXCISION Right    hard to see excision area    Family History  Problem Relation Age of Onset  . Hyperlipidemia Mother   . Breast cancer Mother   . Ovarian cancer Maternal Aunt   . Lung cancer Maternal Uncle   . Arthritis Maternal Grandmother   . Arthritis Maternal  Grandfather   . Colon cancer Maternal Grandfather   . Arthritis Paternal Grandmother   . Hypertension Paternal Grandmother   . Diabetes Paternal Grandmother   . Arthritis Paternal Grandfather   . Diabetes Paternal Grandfather   . Arthritis Cousin     Allergies  Allergen Reactions  . Codeine   . Iodides   . Latex   . Lipitor [Atorvastatin]   . Shellfish Allergy Swelling and Rash  . Sulfa Antibiotics Rash    Current Outpatient Medications on File Prior to Visit  Medication Sig Dispense Refill  . amLODipine (NORVASC) 5 MG tablet TAKE 1 TABLET BY MOUTH  DAILY 90 tablet 1  . rosuvastatin (CRESTOR) 10 MG tablet TAKE 1 TABLET BY MOUTH  DAILY 90 tablet 1  . venlafaxine XR (EFFEXOR-XR) 150 MG 24 hr capsule TAKE 1 CAPSULE BY MOUTH  DAILY WITH BREAKFAST 90 capsule 0  . venlafaxine XR (EFFEXOR-XR) 37.5 MG 24 hr capsule TAKE 1 CAPSULE BY MOUTH  DAILY WITH BREAKFAST. TAKE  WITH 150 MG CAPSULE. 90 capsule 2   No current facility-administered medications on file prior to visit.     BP 122/82   Pulse 81   Temp 97.6 F (36.4 C) (Tympanic)   Ht 5' 8.75" (1.746 m)   Wt 205 lb 8 oz (93.2 kg)   SpO2 95%   BMI 30.57 kg/m    Objective:   Physical Exam  Constitutional: She appears well-nourished.  Neck: Neck supple.  Cardiovascular: Normal rate and regular rhythm.  Respiratory: Effort normal and breath sounds normal.  Skin: Skin is warm and dry.  Psychiatric: She has a normal mood and affect.           Assessment & Plan:

## 2018-12-18 LAB — SAR COV2 SEROLOGY (COVID19)AB(IGG),IA: SARS CoV2 AB IGG: NEGATIVE

## 2019-01-08 NOTE — Telephone Encounter (Signed)
Elaine Young, who do you recommend for a local dermatologist in [place] or [place]?

## 2019-01-14 ENCOUNTER — Other Ambulatory Visit: Payer: Self-pay | Admitting: Primary Care

## 2019-01-14 DIAGNOSIS — F411 Generalized anxiety disorder: Secondary | ICD-10-CM

## 2019-02-15 NOTE — Progress Notes (Signed)
Cardiology Office Note  Date:  02/16/2019   ID:  Aslyn Cottman, DOB 10/24/1960, MRN [id number]  PCP:  Pleas Koch, NP   Chief Complaint  Patient presents with  . other    Palpatations. Medications reviewed verbally.    HPI:  Ms. Elaine Young is a 58 year old woman with past medical history of Nonsmoking Hyperlipidemia, strong family history of coronary artery disease, electrical issues Referred by Alma Friendly for consultation of her palpitations, paroxysmal tachycardia  Seen by PMD 12/2018 At that time reported having intermittent for the last 2-3 months   she previously reported that she " get the shakes,  palpations sometimes ,  feels like she is going to pass out. I eat then lay down an I am ok"  Comes and goes anytime, Gets pale, tach for minutes Describes her symptoms as rapid heartbeat Started several months ago, 2x a week, skips week sometimes 3 min longest episode Flushes when she has an episode, sits and lays down, tries to go to sleep to resolve her symptoms  Husband just thinks it is panic attacks She has never measured how fast her heart is going Significant stress, husband with mantle cell cancer 4 years ago He is doing well currently  EKG personally reviewed by myself on todays visit Shows normal sinus rhythm rate 77 bpm no significant ST or T wave changes  Mom with ablation Family with CAD, CABG   PMH:   has a past medical history of Carpal tunnel syndrome, Cervical disc disorder, Essential hypertension, Functional incontinence, Generalized anxiety disorder, Hyperlipidemia, Lower extremity numbness, Lumbar disc disease, Migraines, Seasonal allergies, and Vitamin D deficiency.  PSH:    Past Surgical History:  Procedure Laterality Date  . ABDOMINAL HYSTERECTOMY    . BLADDER SUSPENSION  2005  . BREAST CYST EXCISION Left   . BREAST CYST EXCISION Left   . BREAST CYST EXCISION Left   . BREAST CYST EXCISION Right    hard to see excision  area  . BREAST CYST EXCISION Right    hard to see excision area    Current Outpatient Medications  Medication Sig Dispense Refill  . amLODipine (NORVASC) 5 MG tablet TAKE 1 TABLET BY MOUTH  DAILY 90 tablet 1  . rosuvastatin (CRESTOR) 10 MG tablet TAKE 1 TABLET BY MOUTH  DAILY 90 tablet 1  . venlafaxine XR (EFFEXOR-XR) 150 MG 24 hr capsule TAKE 1 CAPSULE BY MOUTH  DAILY WITH BREAKFAST 90 capsule 0  . venlafaxine XR (EFFEXOR-XR) 37.5 MG 24 hr capsule TAKE 1 CAPSULE BY MOUTH  DAILY WITH BREAKFAST. TAKE  WITH 150 MG CAPSULE. 90 capsule 2   No current facility-administered medications for this visit.     Allergies:   Codeine, Iodides, Latex, Lipitor [atorvastatin], Shellfish allergy, and Sulfa antibiotics   Social History:  The patient  reports that she has never smoked. She has never used smokeless tobacco. She reports current alcohol use. She reports that she does not use drugs.   Family History:   family history includes Arthritis in her cousin, maternal grandfather, maternal grandmother, paternal grandfather, and paternal grandmother; Breast cancer in her mother; Colon cancer in her maternal grandfather; Diabetes in her paternal grandfather and paternal grandmother; Hyperlipidemia in her mother; Hypertension in her paternal grandmother; Lung cancer in her maternal uncle; Ovarian cancer in her maternal aunt.    Review of Systems: Review of Systems  Constitutional: Negative.   HENT: Negative.   Respiratory: Negative.   Cardiovascular: Positive for palpitations.  Tachycardia episodes  Gastrointestinal: Negative.   Musculoskeletal: Negative.   Neurological: Negative.   Psychiatric/Behavioral: Negative.   All other systems reviewed and are negative.   PHYSICAL EXAM: VS:  BP 138/84 (BP Location: Left Arm, Patient Position: Sitting, Cuff Size: Normal)   Pulse 86   Ht 5\' 8"  (1.727 m)   Wt 205 lb (93 kg)   SpO2 98%   BMI 31.17 kg/m  , BMI Body mass index is 31.17 kg/m. GEN:  Well nourished, well developed, in no acute distress HEENT: normal Neck: no JVD, carotid bruits, or masses Cardiac: RRR; no murmurs, rubs, or gallops,no edema  Respiratory:  clear to auscultation bilaterally, normal work of breathing GI: soft, nontender, nondistended, + BS MS: no deformity or atrophy Skin: warm and dry, no rash Neuro:  Strength and sensation are intact Psych: euthymic mood, full affect   Recent Labs: 05/26/2018: ALT 28 12/17/2018: BUN 13; Creatinine, Ser 0.64; Hemoglobin 13.6; Platelets 262.0; Potassium 3.7; Sodium 139; TSH 2.79    Lipid Panel Lab Results  Component Value Date   CHOL 189 05/26/2018   HDL 63.40 05/26/2018   LDLCALC 100 (H) 05/26/2018   TRIG 129.0 05/26/2018      Wt Readings from Last 3 Encounters:  02/16/19 205 lb (93 kg)  12/17/18 205 lb 8 oz (93.2 kg)  06/24/18 199 lb 8 oz (90.5 kg)      ASSESSMENT AND PLAN:  Problem List Items Addressed This Visit      Cardiology Problems   Essential hypertension   Hyperlipidemia     Other   Palpitations   Relevant Orders   EKG 12-Lead    Other Visit Diagnoses    Paroxysmal tachycardia (HCC)    -  Primary   Relevant Orders   LONG TERM MONITOR (3-14 DAYS)     Symptoms concerning for atrial tachycardia or SVT Rapid rate, paroxysmal in nature lasting 3 minutes or so Very symptomatic, likely dropping her pressure/orthostasis associated with arrhythmia Discussed various types of arrhythmias including SVT, atrial fibrillation, atrial tachycardia, atrial flutter or sinus tachycardia Discussed various types of medications that could be used She is wary of beta-blockers, prefers to stay with calcium channel blockers We will order a two-week extended monitor, ZIO Potentially we could change amlodipine to diltiazem or verapamil   Disposition:   F/U as needed We will call her with results of the monitor   Total encounter time more than 60 minutes  Greater than 50% was spent in counseling and  coordination of care with the patient  Patient was seen in consultation for Sammuel Cooperatherine Clark and will be referred back to her office for ongoing care of the issues detailed above  Signed, Dossie Arbourim Gollan, M.D., Ph.D. [facility], [place] [phone]

## 2019-02-16 ENCOUNTER — Ambulatory Visit (INDEPENDENT_AMBULATORY_CARE_PROVIDER_SITE_OTHER): Payer: BLUE CROSS/BLUE SHIELD

## 2019-02-16 ENCOUNTER — Ambulatory Visit (INDEPENDENT_AMBULATORY_CARE_PROVIDER_SITE_OTHER): Payer: BLUE CROSS/BLUE SHIELD | Admitting: Cardiovascular Disease

## 2019-02-16 ENCOUNTER — Encounter: Payer: Self-pay | Admitting: Cardiovascular Disease

## 2019-02-16 ENCOUNTER — Other Ambulatory Visit: Payer: Self-pay

## 2019-02-16 VITALS — BP 138/84 | HR 86 | Ht 68.0 in | Wt 205.0 lb

## 2019-02-16 DIAGNOSIS — I10 Essential (primary) hypertension: Secondary | ICD-10-CM | POA: Diagnosis not present

## 2019-02-16 DIAGNOSIS — I479 Paroxysmal tachycardia, unspecified: Secondary | ICD-10-CM

## 2019-02-16 DIAGNOSIS — E782 Mixed hyperlipidemia: Secondary | ICD-10-CM

## 2019-02-16 DIAGNOSIS — R002 Palpitations: Secondary | ICD-10-CM

## 2019-02-16 NOTE — Patient Instructions (Addendum)
We will order a zio monitor for paroxysmal tachycardia  Medication Instructions:  No changes  If you need a refill on your cardiac medications before your next appointment, please call your pharmacy.    Lab work: No new labs needed   If you have labs (blood work) drawn today and your tests are completely normal, you will receive your results only by: Marland Kitchen MyChart Message (if you have MyChart) OR . A paper copy in the mail If you have any lab test that is abnormal or we need to change your treatment, we will call you to review the results.   Testing/Procedures: Your physician has recommended that you wear a Zio monitor. This monitor is a medical device that records the heart's electrical activity. Doctors most often use these monitors to diagnose arrhythmias. Arrhythmias are problems with the speed or rhythm of the heartbeat. The monitor is a small device applied to your chest. You can wear one while you do your normal daily activities. While wearing this monitor if you have any symptoms to push the button and record what you felt. Once you have worn this monitor for the period of time provider prescribed (Usually 14 days), you will return the monitor device in the postage paid box. Once it is returned they will download the data collected and provide [place] with a report which the provider will then review and we will call you with those results. Important tips:  1. Avoid showering during the first 24 hours of wearing the monitor. 2. Avoid excessive sweating to help maximize wear time. 3. Do not submerge the device, no hot tubs, and no swimming pools. 4. Keep any lotions or oils away from the patch. 5. After 24 hours you may shower with the patch on. Take brief showers with your back facing the shower head.  6. Do not remove patch once it has been placed because that will interrupt data and decrease adhesive wear time. 7. Push the button when you have any symptoms and write down what you were  feeling. 8. Once you have completed wearing your monitor, remove and place into box which has postage paid and place in your outgoing mailbox.  9. If for some reason you have misplaced your box then call our office and we can provide another box and/or mail it off for you.      [id number]   Follow-Up: At [facility], you and your health needs are our priority.  As part of our continuing mission to provide you with exceptional heart care, we have created designated Provider Care Teams.  These Care Teams include your primary Cardiologist (physician) and Advanced Practice Providers (APPs -  Physician Assistants and Nurse Practitioners) who all work together to provide you with the care you need, when you need it.  . You will need a follow up appointment as needed   . Providers on your designated Care Team:   . Murray Hodgkins, NP . Christell Faith, PA-C . Marrianne Mood, PA-C  Any Other Special Instructions Will Be Listed Below (If Applicable).  For educational health videos Log in to : [email or web address] Or : [email or web address], password : [email or web address]

## 2019-03-31 ENCOUNTER — Other Ambulatory Visit: Payer: Self-pay | Admitting: Primary Care

## 2019-03-31 DIAGNOSIS — E785 Hyperlipidemia, unspecified: Secondary | ICD-10-CM

## 2019-03-31 DIAGNOSIS — I10 Essential (primary) hypertension: Secondary | ICD-10-CM

## 2019-04-09 ENCOUNTER — Other Ambulatory Visit: Payer: Self-pay | Admitting: Primary Care

## 2019-04-09 DIAGNOSIS — F419 Anxiety disorder, unspecified: Secondary | ICD-10-CM

## 2019-04-09 DIAGNOSIS — F329 Major depressive disorder, single episode, unspecified: Secondary | ICD-10-CM

## 2019-04-09 DIAGNOSIS — F32A Depression, unspecified: Secondary | ICD-10-CM

## 2019-04-10 ENCOUNTER — Other Ambulatory Visit: Payer: Self-pay | Admitting: Primary Care

## 2019-04-10 DIAGNOSIS — F411 Generalized anxiety disorder: Secondary | ICD-10-CM

## 2019-05-24 ENCOUNTER — Other Ambulatory Visit: Payer: Self-pay | Admitting: Primary Care

## 2019-05-24 DIAGNOSIS — E782 Mixed hyperlipidemia: Secondary | ICD-10-CM

## 2019-05-24 DIAGNOSIS — R7303 Prediabetes: Secondary | ICD-10-CM

## 2019-05-24 DIAGNOSIS — I10 Essential (primary) hypertension: Secondary | ICD-10-CM

## 2019-06-01 ENCOUNTER — Other Ambulatory Visit: Payer: BLUE CROSS/BLUE SHIELD

## 2019-06-01 ENCOUNTER — Other Ambulatory Visit: Payer: Self-pay

## 2019-06-03 ENCOUNTER — Other Ambulatory Visit: Payer: Self-pay

## 2019-06-03 ENCOUNTER — Other Ambulatory Visit (INDEPENDENT_AMBULATORY_CARE_PROVIDER_SITE_OTHER): Payer: BLUE CROSS/BLUE SHIELD

## 2019-06-03 DIAGNOSIS — I10 Essential (primary) hypertension: Secondary | ICD-10-CM | POA: Diagnosis not present

## 2019-06-03 DIAGNOSIS — R7303 Prediabetes: Secondary | ICD-10-CM

## 2019-06-03 DIAGNOSIS — E782 Mixed hyperlipidemia: Secondary | ICD-10-CM | POA: Diagnosis not present

## 2019-06-03 LAB — HEPATIC FUNCTION PANEL
ALT: 26 U/L (ref 0–35)
AST: 19 U/L (ref 0–37)
Albumin: 4.6 g/dL (ref 3.5–5.2)
Alkaline Phosphatase: 85 U/L (ref 39–117)
Bilirubin, Direct: 0.1 mg/dL (ref 0.0–0.3)
Total Bilirubin: 0.5 mg/dL (ref 0.2–1.2)
Total Protein: 7.1 g/dL (ref 6.0–8.3)

## 2019-06-03 LAB — LIPID PANEL
Cholesterol: 193 mg/dL (ref 0–200)
HDL: 53.2 mg/dL
LDL Cholesterol: 101 mg/dL — ABNORMAL HIGH (ref 0–99)
NonHDL: 140.04
Total CHOL/HDL Ratio: 4
Triglycerides: 196 mg/dL — ABNORMAL HIGH (ref 0.0–149.0)
VLDL: 39.2 mg/dL (ref 0.0–40.0)

## 2019-06-03 LAB — HEMOGLOBIN A1C: Hgb A1c MFr Bld: 6 % (ref 4.6–6.5)

## 2019-06-03 LAB — CBC
HCT: 41.2 % (ref 36.0–46.0)
Hemoglobin: 14 g/dL (ref 12.0–15.0)
MCHC: 34 g/dL (ref 30.0–36.0)
MCV: 91 fl (ref 78.0–100.0)
Platelets: 290 K/uL (ref 150.0–400.0)
RBC: 4.52 Mil/uL (ref 3.87–5.11)
RDW: 12.9 % (ref 11.5–15.5)
WBC: 5.3 K/uL (ref 4.0–10.5)

## 2019-06-05 ENCOUNTER — Other Ambulatory Visit: Payer: Self-pay

## 2019-06-05 ENCOUNTER — Encounter: Payer: Self-pay | Admitting: Primary Care

## 2019-06-05 ENCOUNTER — Ambulatory Visit (INDEPENDENT_AMBULATORY_CARE_PROVIDER_SITE_OTHER): Payer: BLUE CROSS/BLUE SHIELD | Admitting: Primary Care

## 2019-06-05 VITALS — BP 128/82 | HR 80 | Temp 97.3°F | Ht 68.0 in | Wt 206.8 lb

## 2019-06-05 DIAGNOSIS — M545 Low back pain, unspecified: Secondary | ICD-10-CM

## 2019-06-05 DIAGNOSIS — I10 Essential (primary) hypertension: Secondary | ICD-10-CM

## 2019-06-05 DIAGNOSIS — F329 Major depressive disorder, single episode, unspecified: Secondary | ICD-10-CM

## 2019-06-05 DIAGNOSIS — Z0000 Encounter for general adult medical examination without abnormal findings: Secondary | ICD-10-CM

## 2019-06-05 DIAGNOSIS — F419 Anxiety disorder, unspecified: Secondary | ICD-10-CM

## 2019-06-05 DIAGNOSIS — F32A Depression, unspecified: Secondary | ICD-10-CM

## 2019-06-05 DIAGNOSIS — Z1211 Encounter for screening for malignant neoplasm of colon: Secondary | ICD-10-CM

## 2019-06-05 DIAGNOSIS — Z23 Encounter for immunization: Secondary | ICD-10-CM | POA: Diagnosis not present

## 2019-06-05 DIAGNOSIS — G8929 Other chronic pain: Secondary | ICD-10-CM

## 2019-06-05 DIAGNOSIS — E782 Mixed hyperlipidemia: Secondary | ICD-10-CM

## 2019-06-05 DIAGNOSIS — F411 Generalized anxiety disorder: Secondary | ICD-10-CM | POA: Diagnosis not present

## 2019-06-05 DIAGNOSIS — R7303 Prediabetes: Secondary | ICD-10-CM

## 2019-06-05 DIAGNOSIS — Z1231 Encounter for screening mammogram for malignant neoplasm of breast: Secondary | ICD-10-CM

## 2019-06-05 MED ORDER — VENLAFAXINE HCL ER 150 MG PO CP24: ORAL_CAPSULE | ORAL | 3 refills | Status: DC

## 2019-06-05 MED ORDER — VENLAFAXINE HCL ER 37.5 MG PO CP24: ORAL_CAPSULE | ORAL | 3 refills | Status: DC

## 2019-06-05 NOTE — Patient Instructions (Signed)
Continue exercising. You should be getting 150 minutes of moderate intensity exercise weekly.  Continue to work on a healthy diet. Ensure you are consuming 64 ounces of water daily.  Schedule a visit with your orthopedist for the injection.  Schedule a nurse visit for repeat shingles vaccine in 2-6 months.  It was a pleasure to see you today!   Preventive Care 59-59 Years Old, Female Preventive care refers to visits with your health care provider and lifestyle choices that can promote health and wellness. This includes:  A yearly physical exam. This may also be called an annual well check.  Regular dental visits and eye exams.  Immunizations.  Screening for certain conditions.  Healthy lifestyle choices, such as eating a healthy diet, getting regular exercise, not using drugs or products that contain nicotine and tobacco, and limiting alcohol use. What can I expect for my preventive care visit? Physical exam Your health care provider will check your:  Height and weight. This may be used to calculate body mass index (BMI), which tells if you are at a healthy weight.  Heart rate and blood pressure.  Skin for abnormal spots. Counseling Your health care provider may ask you questions about your:  Alcohol, tobacco, and drug use.  Emotional well-being.  Home and relationship well-being.  Sexual activity.  Eating habits.  Work and work Statistician.  Method of birth control.  Menstrual cycle.  Pregnancy history. What immunizations do I need?  Influenza (flu) vaccine  This is recommended every year. Tetanus, diphtheria, and pertussis (Tdap) vaccine  You may need a Td booster every 10 years. Varicella (chickenpox) vaccine  You may need this if you have not been vaccinated. Zoster (shingles) vaccine  You may need this after age 41. Measles, mumps, and rubella (MMR) vaccine  You may need at least one dose of MMR if you were born in 1957 or later. You may also  need a second dose. Pneumococcal conjugate (PCV13) vaccine  You may need this if you have certain conditions and were not previously vaccinated. Pneumococcal polysaccharide (PPSV23) vaccine  You may need one or two doses if you smoke cigarettes or if you have certain conditions. Meningococcal conjugate (MenACWY) vaccine  You may need this if you have certain conditions. Hepatitis A vaccine  You may need this if you have certain conditions or if you travel or work in places where you may be exposed to hepatitis A. Hepatitis B vaccine  You may need this if you have certain conditions or if you travel or work in places where you may be exposed to hepatitis B. Haemophilus influenzae type b (Hib) vaccine  You may need this if you have certain conditions. Human papillomavirus (HPV) vaccine  If recommended by your health care provider, you may need three doses over 6 months. You may receive vaccines as individual doses or as more than one vaccine together in one shot (combination vaccines). Talk with your health care provider about the risks and benefits of combination vaccines. What tests do I need? Blood tests  Lipid and cholesterol levels. These may be checked every 5 years, or more frequently if you are over 59 years old.  Hepatitis C test.  Hepatitis B test. Screening  Lung cancer screening. You may have this screening every year starting at age 59 if you have a 30-pack-year history of smoking and currently smoke or have quit within the past 15 years.  Colorectal cancer screening. All adults should have this screening starting at age 59  and continuing until age 33. Your health care provider may recommend screening at age 40 if you are at increased risk. You will have tests every 1-10 years, depending on your results and the type of screening test.  Diabetes screening. This is done by checking your blood sugar (glucose) after you have not eaten for a while (fasting). You may have  this done every 1-3 years.  Mammogram. This may be done every 1-2 years. Talk with your health care provider about when you should start having regular mammograms. This may depend on whether you have a family history of breast cancer.  BRCA-related cancer screening. This may be done if you have a family history of breast, ovarian, tubal, or peritoneal cancers.  Pelvic exam and Pap test. This may be done every 3 years starting at age 21. Starting at age 33, this may be done every 5 years if you have a Pap test in combination with an HPV test. Other tests  Sexually transmitted disease (STD) testing.  Bone density scan. This is done to screen for osteoporosis. You may have this scan if you are at high risk for osteoporosis. Follow these instructions at home: Eating and drinking  Eat a diet that includes fresh fruits and vegetables, whole grains, lean protein, and low-fat dairy.  Take vitamin and mineral supplements as recommended by your health care provider.  Do not drink alcohol if: ? Your health care provider tells you not to drink. ? You are pregnant, may be pregnant, or are planning to become pregnant.  If you drink alcohol: ? Limit how much you have to 0-1 drink a day. ? Be aware of how much alcohol is in your drink. In the [place] one drink equals one 12 oz bottle of beer (355 mL), one 5 oz glass of wine (148 mL), or one 1 oz glass of hard liquor (44 mL). Lifestyle  Take daily care of your teeth and gums.  Stay active. Exercise for at least 30 minutes on 5 or more days each week.  Do not use any products that contain nicotine or tobacco, such as cigarettes, e-cigarettes, and chewing tobacco. If you need help quitting, ask your health care provider.  If you are sexually active, practice safe sex. Use a condom or other form of birth control (contraception) in order to prevent pregnancy and STIs (sexually transmitted infections).  If told by your health care provider, take  low-dose aspirin daily starting at age 67. What's next?  Visit your health care provider once a year for a well check visit.  Ask your health care provider how often you should have your eyes and teeth checked.  Stay up to date on all vaccines. This information is not intended to replace advice given to you by your health care provider. Make sure you discuss any questions you have with your health care provider. Document Released: 07/15/2015 Document Revised: 02/27/2018 Document Reviewed: 02/27/2018 Elsevier Patient Education  2020 [employer].

## 2019-06-05 NOTE — Assessment & Plan Note (Signed)
Immunizations UTD, first shingrix provided today. Mammogram UTD, due in February 2020. Colon cancer screening due, opts for Cologuard and will complete this year. Orders placed. Encouraged to increase exercise, work on diet. Exam today stable. Labs reviewed.

## 2019-06-05 NOTE — Assessment & Plan Note (Signed)
Stable and exactly the same from Summer 2020 and Winter 2019. Continue to monitor.

## 2019-06-05 NOTE — Addendum Note (Signed)
Addended by: Pleas Koch on: 06/05/2019 09:29 AM   Modules accepted: Orders

## 2019-06-05 NOTE — Assessment & Plan Note (Signed)
Stable in the office today, continue Amlodipine 5 mg.  

## 2019-06-05 NOTE — Progress Notes (Signed)
Subjective:    Patient ID: Elaine Young, female    DOB: 11/09/1960, 58 y.o.   MRN: [id number]  HPI  Elaine Young is a 58 year old female who presents today for complete physical.  Chronic back pain, taking 800 mg three times daily, also alternating with Tylenol with temporary improvement. She plans on scheduling a visit with her orthopedist for an injection.   Immunizations: -Tetanus: Completed in 2017 -Influenza: Due today -Shingles: Never completed   Diet: She endorses a fair diet.  Exercise: She is walking 10,000 steps daily  Eye exam: Completed in 2020 Dental exam: Completed in 2020  Pap Smear: Hysterectomy  Mammogram: Completed in 2020 Colonoscopy: Declines. Would like to try Cologuard Hep C Screen: Negative  BP Readings from Last 3 Encounters:  06/05/19 128/82  02/16/19 138/84  12/17/18 122/82     Review of Systems  Constitutional: Negative for unexpected weight change.  HENT: Negative for rhinorrhea.   Respiratory: Negative for cough and shortness of breath.   Cardiovascular: Negative for chest pain.  Gastrointestinal: Negative for constipation and diarrhea.  Genitourinary: Negative for difficulty urinating.  Musculoskeletal: Positive for arthralgias and back pain. Negative for myalgias.  Skin: Negative for rash.  Allergic/Immunologic: Negative for environmental allergies.  Neurological: Negative for dizziness, numbness and headaches.  Psychiatric/Behavioral: The patient is not nervous/anxious.        Past Medical History:  Diagnosis Date  . Carpal tunnel syndrome   . Cervical disc disorder   . Essential hypertension   . Functional incontinence   . Generalized anxiety disorder   . Hyperlipidemia   . Lower extremity numbness   . Lumbar disc disease   . Migraines   . Seasonal allergies   . Vitamin D deficiency      Social History   Socioeconomic History  . Marital status: Married    Spouse name: Not on file  . Number of children: Not  on file  . Years of education: Not on file  . Highest education level: Not on file  Occupational History  . Not on file  Social Needs  . Financial resource strain: Not on file  . Food insecurity    Worry: Not on file    Inability: Not on file  . Transportation needs    Medical: Not on file    Non-medical: Not on file  Tobacco Use  . Smoking status: Never Smoker  . Smokeless tobacco: Never Used  Substance and Sexual Activity  . Alcohol use: Yes    Alcohol/week: 0.0 standard drinks  . Drug use: No  . Sexual activity: Not on file  Lifestyle  . Physical activity    Days per week: Not on file    Minutes per session: Not on file  . Stress: Not on file  Relationships  . Social Musician on phone: Not on file    Gets together: Not on file    Attends religious service: Not on file    Active member of club or organization: Not on file    Attends meetings of clubs or organizations: Not on file    Relationship status: Not on file  . Intimate partner violence    Fear of current or ex partner: Not on file    Emotionally abused: Not on file    Physically abused: Not on file    Forced sexual activity: Not on file  Other Topics Concern  . Not on file  Social History Narrative  Married.   1 child. 1 grandchild.   Retired.    Enjoys reading.     Past Surgical History:  Procedure Laterality Date  . ABDOMINAL HYSTERECTOMY    . BLADDER SUSPENSION  2005  . BREAST CYST EXCISION Left   . BREAST CYST EXCISION Left   . BREAST CYST EXCISION Left   . BREAST CYST EXCISION Right    hard to see excision area  . BREAST CYST EXCISION Right    hard to see excision area    Family History  Problem Relation Age of Onset  . Hyperlipidemia Mother   . Breast cancer Mother   . Ovarian cancer Maternal Aunt   . Lung cancer Maternal Uncle   . Arthritis Maternal Grandmother   . Arthritis Maternal Grandfather   . Colon cancer Maternal Grandfather   . Arthritis Paternal Grandmother    . Hypertension Paternal Grandmother   . Diabetes Paternal Grandmother   . Arthritis Paternal Grandfather   . Diabetes Paternal Grandfather   . Arthritis Cousin     Allergies  Allergen Reactions  . Codeine   . Iodides   . Latex   . Lipitor [Atorvastatin]   . Shellfish Allergy Swelling and Rash  . Sulfa Antibiotics Rash    Current Outpatient Medications on File Prior to Visit  Medication Sig Dispense Refill  . amLODipine (NORVASC) 5 MG tablet TAKE 1 TABLET BY MOUTH  DAILY 90 tablet 2  . rosuvastatin (CRESTOR) 10 MG tablet TAKE 1 TABLET BY MOUTH  DAILY 90 tablet 2   No current facility-administered medications on file prior to visit.     BP 128/82   Pulse 80   Temp (!) 97.3 F (36.3 C) (Temporal)   Ht 5\' 8"  (1.727 m)   Wt 206 lb 12 oz (93.8 kg)   SpO2 98%   BMI 31.44 kg/m    Objective:   Physical Exam  Constitutional: She is oriented to person, place, and time. She appears well-nourished.  HENT:  Right Ear: Tympanic membrane and ear canal normal.  Left Ear: Tympanic membrane and ear canal normal.  Mouth/Throat: Oropharynx is clear and moist.  Eyes: Pupils are equal, round, and reactive to light. EOM are normal.  Neck: Neck supple.  Cardiovascular: Normal rate and regular rhythm.  Respiratory: Effort normal and breath sounds normal.  GI: Soft. Bowel sounds are normal. There is no abdominal tenderness.  Musculoskeletal: Normal range of motion.  Neurological: She is alert and oriented to person, place, and time. No cranial nerve deficit.  Reflex Scores:      Patellar reflexes are 2+ on the right side and 2+ on the left side. Skin: Skin is warm and dry.  Psychiatric: She has a normal mood and affect.           Assessment & Plan:

## 2019-06-05 NOTE — Assessment & Plan Note (Signed)
Increased, likely due to inability to go to the pool due to Covid-19. She will schedule a visit with her orthopedist. Discussed to limit Advil, continue Tylenol. Recent renal and liver function stable.

## 2019-06-05 NOTE — Addendum Note (Signed)
Addended by: Jacqualin Combes on: 06/05/2019 12:53 PM   Modules accepted: Orders

## 2019-06-05 NOTE — Assessment & Plan Note (Signed)
LDL overall stable. Encouraged healthy diet, increase exercise. Continue rosuvastatin.

## 2019-06-05 NOTE — Assessment & Plan Note (Signed)
Doing well on Venlafaxine dose of 187.5 mg, continue same. Refills sent to pharmacy.

## 2019-06-17 ENCOUNTER — Other Ambulatory Visit: Payer: Self-pay

## 2019-06-17 ENCOUNTER — Ambulatory Visit: Payer: BLUE CROSS/BLUE SHIELD | Attending: Internal Medicine

## 2019-06-17 DIAGNOSIS — Z20828 Contact with and (suspected) exposure to other viral communicable diseases: Secondary | ICD-10-CM | POA: Diagnosis not present

## 2019-06-17 DIAGNOSIS — Z20822 Contact with and (suspected) exposure to covid-19: Secondary | ICD-10-CM

## 2019-06-19 LAB — NOVEL CORONAVIRUS, NAA: SARS-CoV-2, NAA: NOT DETECTED

## 2019-07-08 ENCOUNTER — Ambulatory Visit: Payer: BLUE CROSS/BLUE SHIELD | Attending: Internal Medicine

## 2019-07-08 DIAGNOSIS — Z20822 Contact with and (suspected) exposure to covid-19: Secondary | ICD-10-CM

## 2019-07-09 LAB — NOVEL CORONAVIRUS, NAA: SARS-CoV-2, NAA: NOT DETECTED

## 2019-08-19 ENCOUNTER — Ambulatory Visit
Admission: RE | Admit: 2019-08-19 | Discharge: 2019-08-19 | Disposition: A | Discharge status: Home / Self Care | Payer: BLUE CROSS/BLUE SHIELD | Source: Ambulatory Visit | Attending: Primary Care | Admitting: Primary Care

## 2019-08-19 DIAGNOSIS — Z1231 Encounter for screening mammogram for malignant neoplasm of breast: Secondary | ICD-10-CM | POA: Insufficient documentation

## 2019-08-20 ENCOUNTER — Ambulatory Visit: Payer: BLUE CROSS/BLUE SHIELD

## 2019-08-25 ENCOUNTER — Telehealth: Payer: Self-pay

## 2019-08-25 NOTE — Telephone Encounter (Signed)
Pt has NV apt tomorrow. Pt needs screened for COVID. LVM

## 2019-08-26 ENCOUNTER — Other Ambulatory Visit: Payer: Self-pay

## 2019-08-26 ENCOUNTER — Ambulatory Visit (INDEPENDENT_AMBULATORY_CARE_PROVIDER_SITE_OTHER): Payer: BLUE CROSS/BLUE SHIELD

## 2019-08-26 DIAGNOSIS — Z23 Encounter for immunization: Secondary | ICD-10-CM | POA: Diagnosis not present

## 2019-09-03 DIAGNOSIS — M5416 Radiculopathy, lumbar region: Secondary | ICD-10-CM | POA: Diagnosis not present

## 2019-09-14 DIAGNOSIS — M5416 Radiculopathy, lumbar region: Secondary | ICD-10-CM | POA: Diagnosis not present

## 2019-09-22 DIAGNOSIS — M4726 Other spondylosis with radiculopathy, lumbar region: Secondary | ICD-10-CM | POA: Diagnosis not present

## 2019-09-22 DIAGNOSIS — M5416 Radiculopathy, lumbar region: Secondary | ICD-10-CM | POA: Diagnosis not present

## 2019-09-22 DIAGNOSIS — M48061 Spinal stenosis, lumbar region without neurogenic claudication: Secondary | ICD-10-CM | POA: Diagnosis not present

## 2019-09-22 DIAGNOSIS — M5117 Intervertebral disc disorders with radiculopathy, lumbosacral region: Secondary | ICD-10-CM | POA: Diagnosis not present

## 2019-09-24 DIAGNOSIS — Z6831 Body mass index (BMI) 31.0-31.9, adult: Secondary | ICD-10-CM | POA: Diagnosis not present

## 2019-09-24 DIAGNOSIS — M5416 Radiculopathy, lumbar region: Secondary | ICD-10-CM | POA: Diagnosis not present

## 2019-09-24 DIAGNOSIS — I10 Essential (primary) hypertension: Secondary | ICD-10-CM | POA: Diagnosis not present

## 2019-09-29 IMAGING — CT CT RENAL STONE PROTOCOL
2 of 4 series · 17 of 46 positions shown, 19 images · non-contrast
Comparison: None.

CLINICAL DATA: Left flank pain today

EXAM:
CT ABDOMEN AND PELVIS WITHOUT CONTRAST
TECHNIQUE: Multidetector CT imaging of the abdomen and pelvis was performed
following the standard protocol without IV contrast.

[Series 2: stone full standard · axial · 0.89mm/px · z∈[-780,-280]mm · 14 of 110 slices shown, 16 images]
[im 5/110  soft-tissue]
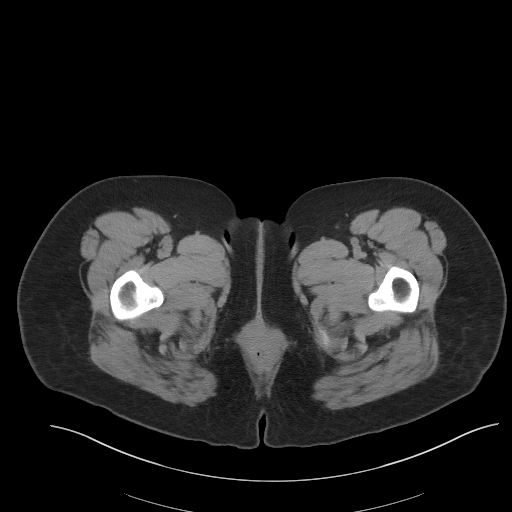
[im 5/110  bone]
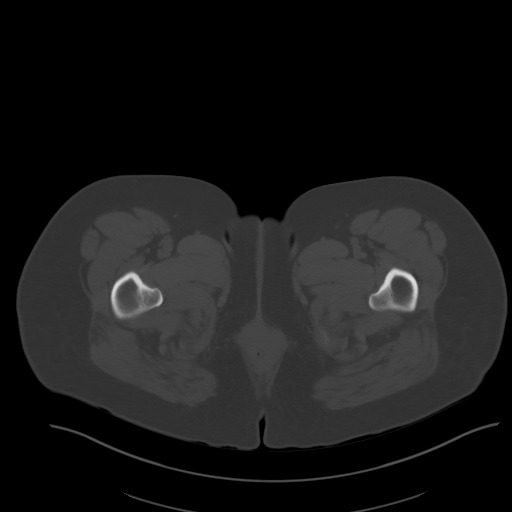
[im 13/110  soft-tissue]
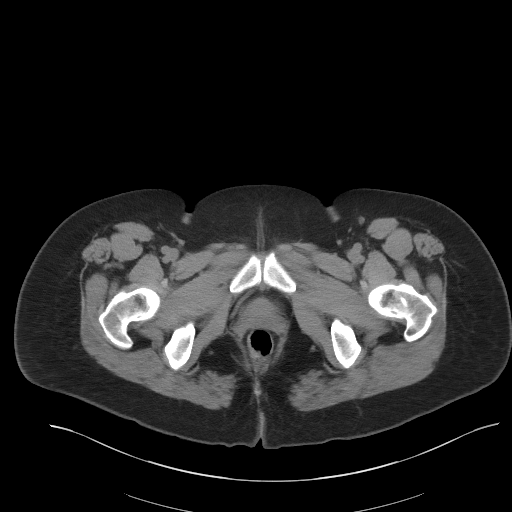
[im 21/110  soft-tissue]
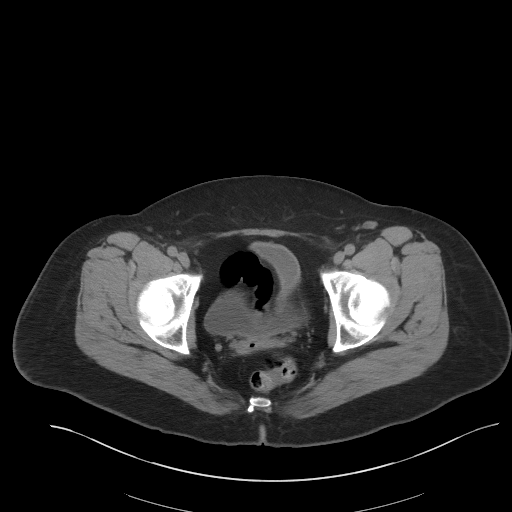
[im 30/110  soft-tissue]
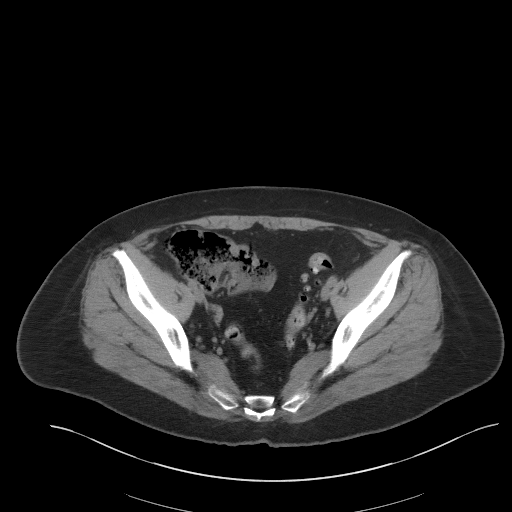
[im 38/110  soft-tissue]
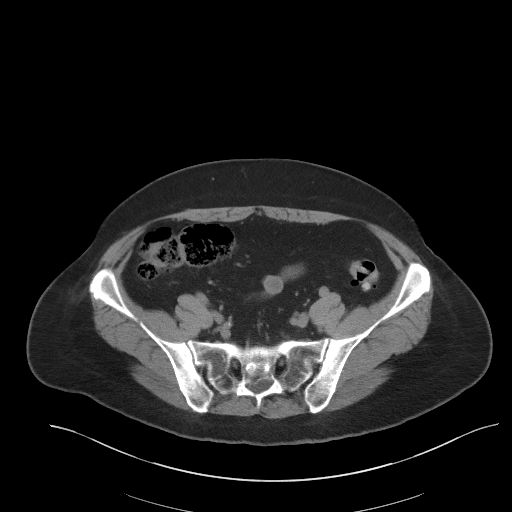
[im 42/110  soft-tissue]
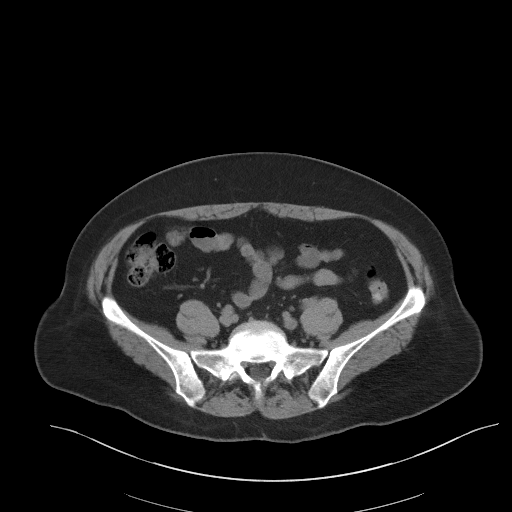
[im 51/110  soft-tissue]
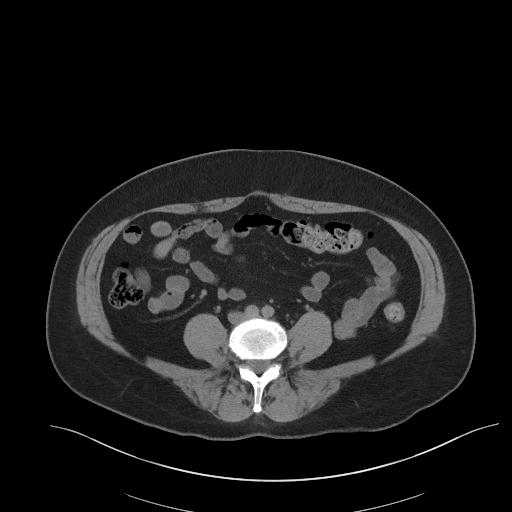
[im 59/110  soft-tissue]
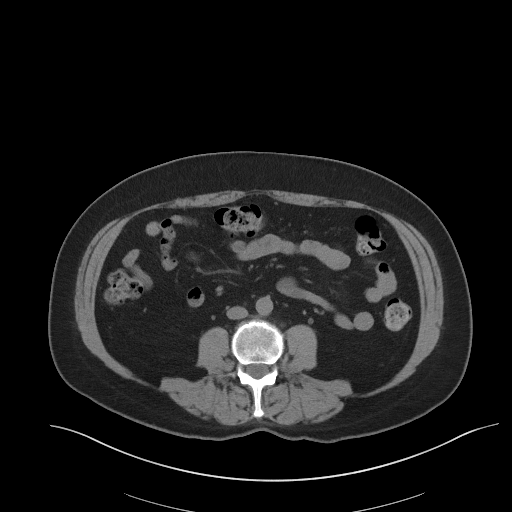
[im 68/110  soft-tissue]
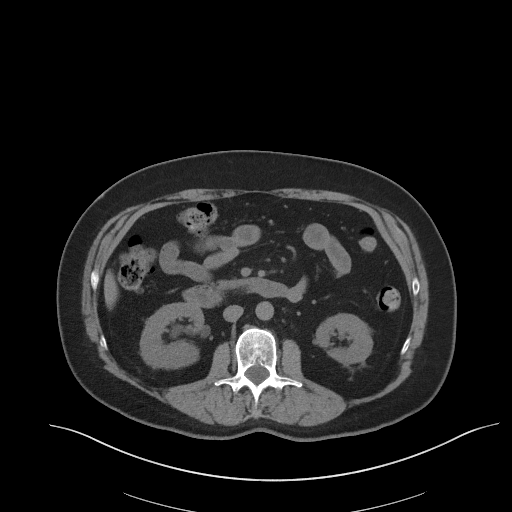
[im 68/110  bone]
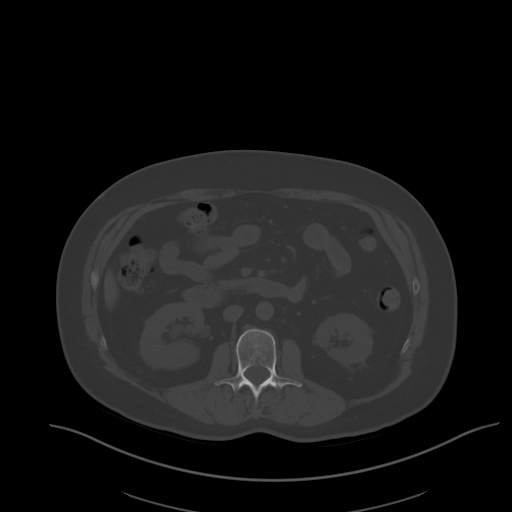
[im 72/110  soft-tissue]
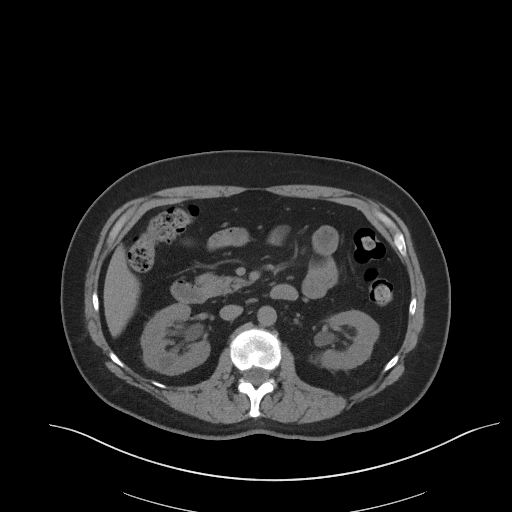
[im 80/110  soft-tissue]
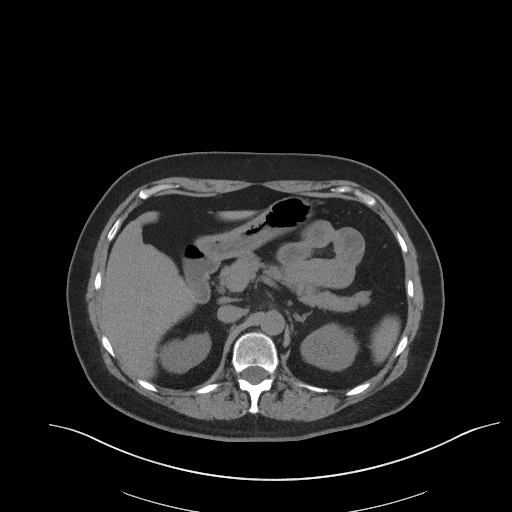
[im 89/110  soft-tissue]
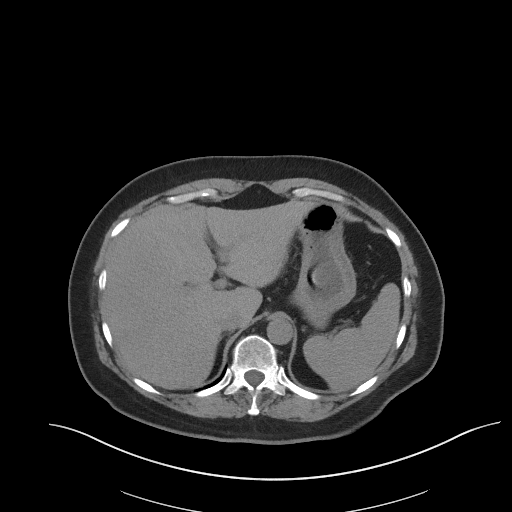
[im 97/110  soft-tissue]
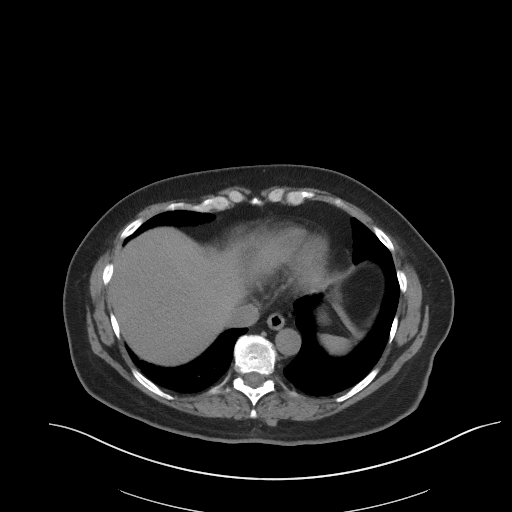
[im 105/110  soft-tissue]
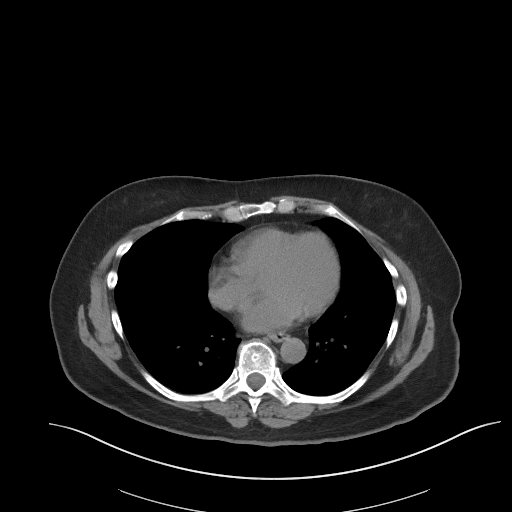

[Series 5: coronal · coronal · 0.92mm/px · 3 of 132 slices shown]
[im 44/132  soft-tissue]
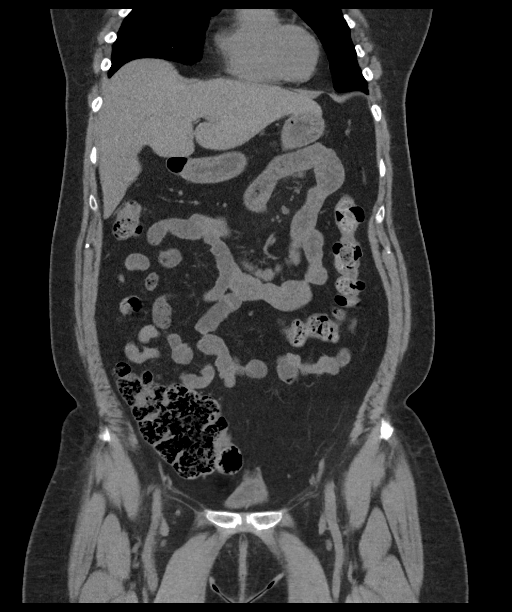
[im 59/132  soft-tissue]
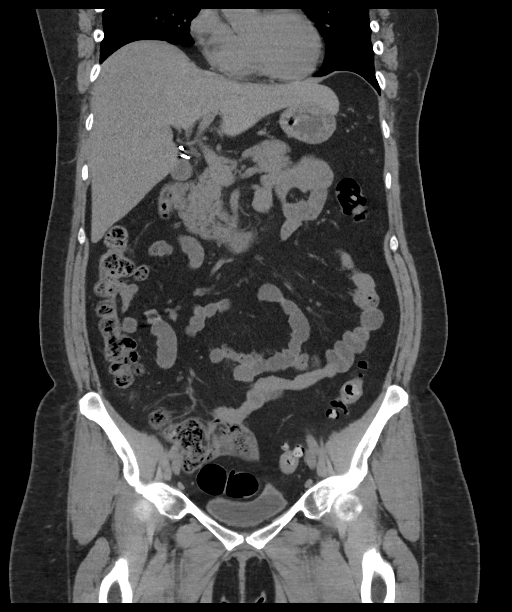
[im 73/132  soft-tissue]
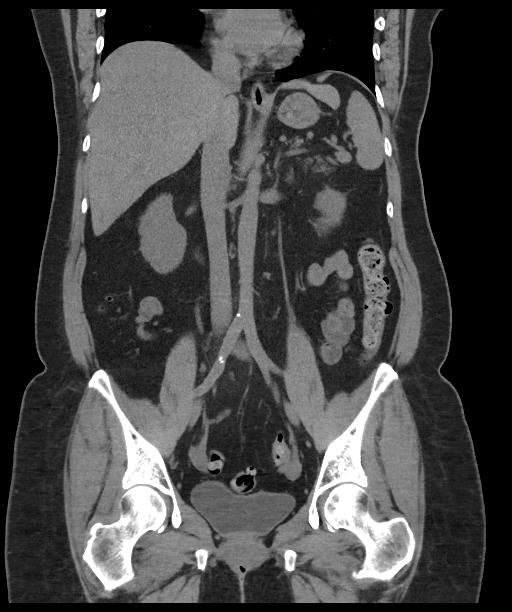

[17 of 46 positions shown; findings below may reference images not displayed]

FINDINGS: Lower chest: Dependent atelectasis.

Hepatobiliary: Postcholecystectomy.  Unremarkable liver.

Pancreas: Unremarkable

Spleen: Unremarkable

Adrenals/Urinary Tract: Adrenal glands are within normal limits.
Simple cyst in the right kidney. No hydronephrosis. No urinary
calculus. Unremarkable bladder.

Stomach/Bowel: Normal appendix. No obvious mass in the colon.
Diverticulosis of the sigmoid colon. No evidence of small-bowel
obstruction. There is a serpiginous low density object within the
lumen of the stomach of unknown significance.

Vascular/Lymphatic: No abnormal adenopathy. Mild atherosclerotic
calcifications of the aorta and iliac artery's.

Reproductive: Uterus is absent.  Adnexa are within normal limits.

Other: No free-fluid.

Musculoskeletal: No vertebral compression deformity. Left L5
hemilaminectomy defect is noted.
IMPRESSION: No evidence of urinary obstruction or urinary calculus.

Left L5 laminectomy from prior discectomy surgery.

Serpiginous object in the stomach may represent an ingested object.
Correlate with history of foreign body ingestion.

## 2019-10-16 ENCOUNTER — Other Ambulatory Visit: Payer: Self-pay | Admitting: Neurological Surgery

## 2019-10-20 ENCOUNTER — Other Ambulatory Visit: Payer: Self-pay

## 2019-10-30 DIAGNOSIS — M4807 Spinal stenosis, lumbosacral region: Secondary | ICD-10-CM | POA: Diagnosis not present

## 2019-11-02 ENCOUNTER — Telehealth (HOSPITAL_COMMUNITY): Payer: Self-pay

## 2019-11-02 NOTE — Telephone Encounter (Signed)

## 2019-11-03 ENCOUNTER — Ambulatory Visit: Payer: BLUE CROSS/BLUE SHIELD | Admitting: Vascular Surgery

## 2019-11-03 ENCOUNTER — Other Ambulatory Visit: Payer: Self-pay

## 2019-11-03 ENCOUNTER — Encounter: Payer: Self-pay | Admitting: Vascular Surgery

## 2019-11-03 VITALS — BP 122/77 | HR 93 | Temp 98.0°F | Resp 20 | Ht 68.0 in | Wt 207.0 lb

## 2019-11-03 DIAGNOSIS — M545 Low back pain, unspecified: Secondary | ICD-10-CM

## 2019-11-03 DIAGNOSIS — G8929 Other chronic pain: Secondary | ICD-10-CM

## 2019-11-03 NOTE — Progress Notes (Signed)
Patient name: Elaine Young MRN: [id number] DOB: [birth year] Sex: female  REASON FOR CONSULT: Evaluate for L5-S1 ALIF  HPI: Elaine Young is a 59 y.o. female, with history of hypertension hyperlipidemia and chronic lower back pain that presents for planned L5-S1 ALIF.  Patient reports that she has had lower back pain for some time with radiculopathy down both legs.  She has been under the care of Dr. Yetta Barre and has failed conservative management.  He subsequently recommended an anterior L5-S1 approach.  Mri showed that she has got degenerative disc disease at L5-S1 with foraminal stenosis and has had a previous left L5-S1 microdiscectomy.  She reports previous abdominal surgery includes C-section through a lower transverse incision as well as laparoscopic cholecystectomy.  Past Medical History:  Diagnosis Date  . Carpal tunnel syndrome   . Cervical disc disorder   . Essential hypertension   . Functional incontinence   . Generalized anxiety disorder   . Hyperlipidemia   . Lower extremity numbness   . Lumbar disc disease   . Migraines   . Seasonal allergies   . Vitamin D deficiency     Past Surgical History:  Procedure Laterality Date  . ABDOMINAL HYSTERECTOMY    . BLADDER SUSPENSION  2005  . BREAST BIOPSY Bilateral yrs ago in [place]   high risk lesions, not cancer per pt  . BREAST CYST EXCISION Bilateral yrs ago in [place]   hard to see some excision areas    Family History  Problem Relation Age of Onset  . Hyperlipidemia Mother   . Breast cancer Mother 33  . Ovarian cancer Maternal Aunt   . Lung cancer Maternal Uncle   . Arthritis Maternal Grandmother   . Arthritis Maternal Grandfather   . Colon cancer Maternal Grandfather   . Arthritis Paternal Grandmother   . Hypertension Paternal Grandmother   . Diabetes Paternal Grandmother   . Arthritis Paternal Grandfather   . Diabetes Paternal Grandfather   . Arthritis Cousin   . Breast cancer Cousin        maternal side  x2    SOCIAL HISTORY: Social History   Socioeconomic History  . Marital status: Married    Spouse name: Not on file  . Number of children: Not on file  . Years of education: Not on file  . Highest education level: Not on file  Occupational History  . Not on file  Tobacco Use  . Smoking status: Never Smoker  . Smokeless tobacco: Never Used  Substance and Sexual Activity  . Alcohol use: Yes    Alcohol/week: 0.0 standard drinks  . Drug use: No  . Sexual activity: Not on file  Other Topics Concern  . Not on file  Social History Narrative   Married.   1 child. 1 grandchild.   Retired.    Enjoys reading.    Social Determinants of Health   Financial Resource Strain:   . Difficulty of Paying Living Expenses:   Food Insecurity:   . Worried About Programme researcher, broadcasting/film/video in the Last Year:   . Barista in the Last Year:   Transportation Needs:   . Freight forwarder (Medical):   Marland Kitchen Lack of Transportation (Non-Medical):   Physical Activity:   . Days of Exercise per Week:   . Minutes of Exercise per Session:   Stress:   . Feeling of Stress :   Social Connections:   . Frequency of Communication with Friends and Family:   .  Frequency of Social Gatherings with Friends and Family:   . Attends Religious Services:   . Active Member of Clubs or Organizations:   . Attends Archivist Meetings:   Marland Kitchen Marital Status:   Intimate Partner Violence:   . Fear of Current or Ex-Partner:   . Emotionally Abused:   Marland Kitchen Physically Abused:   . Sexually Abused:     Allergies  Allergen Reactions  . Codeine Nausea And Vomiting  . Iodine Other (See Comments)    Redness around lips  . Latex Rash  . Lipitor [Atorvastatin] Rash  . Shellfish Allergy Swelling and Rash  . Sulfa Antibiotics Rash    Current Outpatient Medications  Medication Sig Dispense Refill  . acetaminophen (TYLENOL) 500 MG tablet Take 500 mg by mouth every 6 (six) hours as needed (for pain.).    Marland Kitchen amLODipine  (NORVASC) 5 MG tablet TAKE 1 TABLET BY MOUTH  DAILY (Patient taking differently: Take 5 mg by mouth at bedtime. ) 90 tablet 2  . gabapentin (NEURONTIN) 300 MG capsule Take 300 mg by mouth in the morning and at bedtime.     . meloxicam (MOBIC) 15 MG tablet Take 15 mg by mouth at bedtime.     . rosuvastatin (CRESTOR) 10 MG tablet TAKE 1 TABLET BY MOUTH  DAILY (Patient taking differently: Take 10 mg by mouth at bedtime. ) 90 tablet 2  . venlafaxine XR (EFFEXOR-XR) 150 MG 24 hr capsule TAKE 1 CAPSULE BY MOUTH  DAILY WITH BREAKFAST. Take with 37.5 mg capsule. (Patient taking differently: Take 150 mg by mouth at bedtime. TAKE 1 CAPSULE BY MOUTH  DAILY WITH BREAKFAST. Take with 37.5 mg capsule.) 90 capsule 3  . venlafaxine XR (EFFEXOR-XR) 37.5 MG 24 hr capsule TAKE 1 CAPSULE BY MOUTH  DAILY WITH BREAKFAST. TAKE  WITH 150 MG CAPSULE. (Patient taking differently: Take 37.5 mg by mouth at bedtime. TAKE 1 CAPSULE BY MOUTH  DAILY WITH BREAKFAST. TAKE  WITH 150 MG CAPSULE.) 90 capsule 3   No current facility-administered medications for this visit.    REVIEW OF SYSTEMS:  [X]  denotes positive finding, [ ]  denotes negative finding Cardiac  Comments:  Chest pain or chest pressure:    Shortness of breath upon exertion:    Short of breath when lying flat:    Irregular heart rhythm:        Vascular    Pain in calf, thigh, or hip brought on by ambulation:    Pain in feet at night that wakes you up from your sleep:     Blood clot in your veins:    Leg swelling:         Pulmonary    Oxygen at home:    Productive cough:     Wheezing:         Neurologic    Sudden weakness in arms or legs:     Sudden numbness in arms or legs:     Sudden onset of difficulty speaking or slurred speech:    Temporary loss of vision in one eye:     Problems with dizziness:         Gastrointestinal    Blood in stool:     Vomited blood:         Genitourinary    Burning when urinating:     Blood in urine:          Psychiatric    Major depression:         Hematologic  Bleeding problems:    Problems with blood clotting too easily:        Skin    Rashes or ulcers:        Constitutional    Fever or chills:      PHYSICAL EXAM: Vitals:   11/03/19 1501  BP: 122/77  Pulse: 93  Resp: 20  Temp: 98 F (36.7 C)  SpO2: 96%  Weight: 207 lb (93.9 kg)  Height: 5\' 8"  (1.727 m)    GENERAL: The patient is a well-nourished female, in no acute distress. The vital signs are documented above. CARDIAC: There is a regular rate and rhythm.  VASCULAR:  Palpable femoral pulses both groins Palpable dorsalis pedis posterior tibial pulses bilaterally PULMONARY: There is good air exchange bilaterally without wheezing or rales. ABDOMEN: Soft and non-tender with normal pitched bowel sounds.  MUSCULOSKELETAL: There are no major deformities or cyanosis. NEUROLOGIC: No focal weakness or paresthesias are detected. SKIN: There are no ulcers or rashes noted. PSYCHIATRIC: The patient has a normal affect.  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  Assessment/Plan:  59 year old female that presents for preop evaluation of planned L5-S1 ALIF with Dr. 46 in the setting of chronic degenerative disc disease and lower back pain.  I think she would be a good candidate for anterior approach and I discussed steps of surgery including left paramedian incision over the left rectus with mobilization of the left rectus and then entering the retroperitoneal space and mobilizing the peritoneum ureter across midline and mobilization of the left iliac artery and vein.  We talked about risk of injury to the above structures.  I think she would be a good candidate and look forward to helping Dr. Yetta Barre.   Yetta Barre, MD [facility] Office: [phone]

## 2019-11-11 NOTE — Progress Notes (Signed)
Your procedure is scheduled on Monday May 17.  Report to [facility] Main Entrance "A" at 05:30 A.M., and check in at the Admitting office.  Call this number if you have problems the morning of surgery: [phone]  Call [phone] if you have any questions prior to your surgery date Monday-Friday 8am-4pm   Remember: Do not eat or drink after midnight the night before your surgery    Take these medicines the morning of surgery with A SIP OF WATER: gabapentin (NEURONTIN)  venlafaxine XR (EFFEXOR-XR)   If needed: acetaminophen (TYLENOL)    As of today, STOP taking any meloxicam (MOBIC),  Aspirin (unless otherwise instructed by your surgeon), Aleve, Naproxen, Ibuprofen, Motrin, Advil, Goody's, BC's, all herbal medications, fish oil, and all vitamins.    The Morning of Surgery  Do not wear jewelry, make-up or nail polish.  Do not wear lotions, powders, perfumes, or deodorant  Do not shave 48 hours prior to surgery.    Do not bring valuables to the hospital.  [facility] is not responsible for any belongings or valuables.  If you are a smoker, DO NOT Smoke 24 hours prior to surgery  If you wear a CPAP at night please bring your mask the morning of surgery   Remember that you must have someone to transport you home after your surgery, and remain with you for 24 hours if you are discharged the same day.   Please bring cases for contacts, glasses, hearing aids, dentures or bridgework because it cannot be worn into surgery.    Leave your suitcase in the car.  After surgery it may be brought to your room.  For patients admitted to the hospital, discharge time will be determined by your treatment team.  Patients discharged the day of surgery will not be allowed to drive home.    Special instructions:   [facility]- Preparing For Surgery  Before surgery, you can play an important role. Because skin is not sterile, your skin needs to be as free of germs as possible. You can  reduce the number of germs on your skin by washing with CHG (chlorahexidine gluconate) Soap before surgery.  CHG is an antiseptic cleaner which kills germs and bonds with the skin to continue killing germs even after washing.    Oral Hygiene is also important to reduce your risk of infection.  Remember - BRUSH YOUR TEETH THE MORNING OF SURGERY WITH YOUR REGULAR TOOTHPASTE  Please do not use if you have an allergy to CHG or antibacterial soaps. If your skin becomes reddened/irritated stop using the CHG.  Do not shave (including legs and underarms) for at least 48 hours prior to first CHG shower. It is OK to shave your face.  Please follow these instructions carefully.   1. Shower the NIGHT BEFORE SURGERY and the MORNING OF SURGERY with CHG Soap.   2. If you chose to wash your hair and body, wash as usual with your normal shampoo and body-wash/soap.  3. Rinse your hair and body thoroughly to remove the shampoo and soap.  4. Apply CHG directly to the skin (ONLY FROM THE NECK DOWN) and wash gently with a scrungie or a clean washcloth.   5. Do not use on open wounds or open sores. Avoid contact with your eyes, ears, mouth and genitals (private parts). Wash Face and genitals (private parts)  with your normal soap.   6. Wash thoroughly, paying special attention to the area where your surgery will be performed.  7. Thoroughly rinse your body with warm water from the neck down.  8. DO NOT shower/wash with your normal soap after using and rinsing off the CHG Soap.  9. Pat yourself dry with a CLEAN TOWEL.  10. Wear CLEAN PAJAMAS to bed the night before surgery  11. Place CLEAN SHEETS on your bed the night of your first shower and DO NOT SLEEP WITH PETS.  12. Wear comfortable clothes the morning of surgery.     Day of Surgery:  Please shower the morning of surgery with the CHG soap Do not apply any deodorants/lotions. Please wear clean clothes to the hospital/surgery center.   Remember to  brush your teeth WITH YOUR REGULAR TOOTHPASTE.   Please read over the following fact sheets that you were given.

## 2019-11-12 ENCOUNTER — Encounter (HOSPITAL_COMMUNITY)
Admission: RE | Admit: 2019-11-12 | Discharge: 2019-11-12 | Disposition: A | Discharge status: Home / Self Care | Payer: BLUE CROSS/BLUE SHIELD | Source: Ambulatory Visit | Attending: Neurological Surgery | Admitting: Neurological Surgery

## 2019-11-12 ENCOUNTER — Other Ambulatory Visit: Payer: Self-pay

## 2019-11-12 ENCOUNTER — Other Ambulatory Visit (HOSPITAL_COMMUNITY)
Admission: RE | Admit: 2019-11-12 | Discharge: 2019-11-12 | Disposition: A | Discharge status: Home / Self Care | Payer: BLUE CROSS/BLUE SHIELD | Source: Ambulatory Visit | Attending: Neurological Surgery | Admitting: Neurological Surgery

## 2019-11-12 ENCOUNTER — Encounter (HOSPITAL_COMMUNITY): Payer: Self-pay

## 2019-11-12 DIAGNOSIS — M961 Postlaminectomy syndrome, not elsewhere classified: Secondary | ICD-10-CM

## 2019-11-12 DIAGNOSIS — M48061 Spinal stenosis, lumbar region without neurogenic claudication: Secondary | ICD-10-CM

## 2019-11-12 DIAGNOSIS — Z01818 Encounter for other preprocedural examination: Secondary | ICD-10-CM | POA: Insufficient documentation

## 2019-11-12 DIAGNOSIS — M5136 Other intervertebral disc degeneration, lumbar region: Secondary | ICD-10-CM

## 2019-11-12 DIAGNOSIS — Z20822 Contact with and (suspected) exposure to covid-19: Secondary | ICD-10-CM | POA: Insufficient documentation

## 2019-11-12 HISTORY — DX: Other complications of anesthesia, initial encounter: T88.59XA

## 2019-11-12 HISTORY — DX: Nausea with vomiting, unspecified: R11.2

## 2019-11-12 HISTORY — DX: Other specified postprocedural states: Z98.890

## 2019-11-12 LAB — CBC WITH DIFFERENTIAL/PLATELET
Abs Immature Granulocytes: 0.01 10*3/uL (ref 0.00–0.07)
Basophils Absolute: 0 10*3/uL (ref 0.0–0.1)
Basophils Relative: 0 %
Eosinophils Absolute: 0 10*3/uL (ref 0.0–0.5)
Eosinophils Relative: 0 %
HCT: 45.4 % (ref 36.0–46.0)
Hemoglobin: 15.1 g/dL — ABNORMAL HIGH (ref 12.0–15.0)
Immature Granulocytes: 0 %
Lymphocytes Relative: 34 %
Lymphs Abs: 1.9 10*3/uL (ref 0.7–4.0)
MCH: 30.8 pg (ref 26.0–34.0)
MCHC: 33.3 g/dL (ref 30.0–36.0)
MCV: 92.5 fL (ref 80.0–100.0)
Monocytes Absolute: 0.5 10*3/uL (ref 0.1–1.0)
Monocytes Relative: 9 %
Neutro Abs: 3.2 10*3/uL (ref 1.7–7.7)
Neutrophils Relative %: 57 %
Platelets: 292 10*3/uL (ref 150–400)
RBC: 4.91 MIL/uL (ref 3.87–5.11)
RDW: 13.4 % (ref 11.5–15.5)
WBC: 5.7 10*3/uL (ref 4.0–10.5)
nRBC: 0 % (ref 0.0–0.2)

## 2019-11-12 LAB — PROTIME-INR
INR: 1 (ref 0.8–1.2)
Prothrombin Time: 12.5 seconds (ref 11.4–15.2)

## 2019-11-12 LAB — BASIC METABOLIC PANEL
Anion gap: 11 (ref 5–15)
BUN: 12 mg/dL (ref 6–20)
CO2: 22 mmol/L (ref 22–32)
Calcium: 9.5 mg/dL (ref 8.9–10.3)
Chloride: 107 mmol/L (ref 98–111)
Creatinine, Ser: 0.66 mg/dL (ref 0.44–1.00)
GFR calc Af Amer: >60 mL/min (ref >60)
GFR calc non Af Amer: >60 mL/min (ref >60)
Glucose, Bld: 120 mg/dL — ABNORMAL HIGH (ref 70–99)
Potassium: 3.7 mmol/L (ref 3.5–5.1)
Sodium: 140 mmol/L (ref 135–145)

## 2019-11-12 LAB — TYPE AND SCREEN
ABO/RH(D): O POS
Antibody Screen: NEGATIVE

## 2019-11-12 LAB — SURGICAL PCR SCREEN
MRSA, PCR: NEGATIVE
Staphylococcus aureus: NEGATIVE

## 2019-11-12 LAB — SARS CORONAVIRUS 2 (TAT 6-24 HRS): SARS Coronavirus 2: NEGATIVE

## 2019-11-12 LAB — ABO/RH: ABO/RH(D): O POS

## 2019-11-12 NOTE — Progress Notes (Signed)
PCP - Vernona Rieger, NP Cardiologist - pt denies   Chest x-ray - 11/12/19 EKG - 09/12/19 Stress Test - >10 years ago in [place] per pt ECHO - >10 years ago in [place] per pt  > no findings for either per pt. She had tests done d/t her feeling like she had palpitations, but when she got the tests done, no issues were found. Per pt, she probably had too much caffeine that day and felt fine since then.   Cardiac Cath - pt denies   Blood Thinner Instructions:n/a Aspirin Instructions:n/a  ERAS Protcol - n/a PRE-SURGERY Ensure or G2- n/a  COVID TEST- 11/12/19  Coronavirus Screening  Have you experienced the following symptoms:  Cough yes/no: No Fever (>100.37F)  yes/no: No Runny nose yes/no: No Sore throat yes/no: No Difficulty breathing/shortness of breath  yes/no: No  Have you or a family member traveled in the last 14 days and where? yes/no: No   If the patient indicates "YES" to the above questions, their PAT will be rescheduled to limit the exposure to others and, the surgeon will be notified. THE PATIENT WILL NEED TO BE ASYMPTOMATIC FOR 14 DAYS.   If the patient is not experiencing any of these symptoms, the PAT nurse will instruct them to NOT bring anyone with them to their appointment since they may have these symptoms or traveled as well.   Please remind your patients and families that hospital visitation restrictions are in effect and the importance of the restrictions.     Anesthesia review: n/a  Patient denies shortness of breath, fever, cough and chest pain at PAT appointment   All instructions explained to the patient, with a verbal understanding of the material. Patient agrees to go over the instructions while at home for a better understanding. Patient also instructed to self quarantine after being tested for COVID-19. The opportunity to ask questions was provided.

## 2019-11-15 NOTE — Anesthesia Preprocedure Evaluation (Addendum)
Anesthesia Evaluation  Patient identified by MRN, date of birth, ID band Patient awake    Reviewed: Allergy & Precautions, NPO status , Patient's Chart, lab work & pertinent test results  History of Anesthesia Complications (+) PONV  Airway Mallampati: III  TM Distance: >3 FB Neck ROM: Full  Mouth opening: Limited Mouth Opening  Dental no notable dental hx. (+) Chipped, Dental Advisory Given,    Pulmonary neg pulmonary ROS,    Pulmonary exam normal breath sounds clear to auscultation       Cardiovascular hypertension, Pt. on medications Normal cardiovascular exam Rhythm:Regular Rate:Normal  HLD   Neuro/Psych  Headaches, PSYCHIATRIC DISORDERS Anxiety Depression    GI/Hepatic negative GI ROS, Neg liver ROS,   Endo/Other  negative endocrine ROS  Renal/GU negative Renal ROS  negative genitourinary   Musculoskeletal  (+) Arthritis ,   Abdominal   Peds  Hematology negative hematology ROS (+)   Anesthesia Other Findings   Reproductive/Obstetrics                            Anesthesia Physical Anesthesia Plan  ASA: II  Anesthesia Plan: General   Post-op Pain Management:    Induction: Intravenous  PONV Risk Score and Plan: 4 or greater and Midazolam, Dexamethasone, Ondansetron and Scopolamine patch - Pre-op  Airway Management Planned: Oral ETT  Additional Equipment:   Intra-op Plan:   Post-operative Plan: Extubation in OR  Informed Consent: I have reviewed the patients History and Physical, chart, labs and discussed the procedure including the risks, benefits and alternatives for the proposed anesthesia with the patient or authorized representative who has indicated his/her understanding and acceptance.     Dental advisory given  Plan Discussed with: CRNA  Anesthesia Plan Comments:         Anesthesia Quick Evaluation

## 2019-11-16 ENCOUNTER — Inpatient Hospital Stay (HOSPITAL_COMMUNITY): Payer: BLUE CROSS/BLUE SHIELD

## 2019-11-16 ENCOUNTER — Inpatient Hospital Stay (HOSPITAL_COMMUNITY)
Admission: RE | Admit: 2019-11-16 | Discharge: 2019-11-17 | DRG: 460 | Disposition: A | Discharge status: Home / Self Care | Payer: BLUE CROSS/BLUE SHIELD | Attending: Neurological Surgery | Admitting: Neurological Surgery

## 2019-11-16 ENCOUNTER — Encounter (HOSPITAL_COMMUNITY)
Admission: RE | Disposition: A | Discharge status: Home / Self Care | Payer: Self-pay | Source: Home / Self Care | Attending: Neurological Surgery

## 2019-11-16 ENCOUNTER — Inpatient Hospital Stay (HOSPITAL_COMMUNITY): Payer: BLUE CROSS/BLUE SHIELD | Admitting: Anesthesiology

## 2019-11-16 ENCOUNTER — Other Ambulatory Visit: Payer: Self-pay

## 2019-11-16 ENCOUNTER — Encounter (HOSPITAL_COMMUNITY): Payer: Self-pay | Admitting: Neurological Surgery

## 2019-11-16 DIAGNOSIS — M5137 Other intervertebral disc degeneration, lumbosacral region: Principal | ICD-10-CM | POA: Diagnosis present

## 2019-11-16 DIAGNOSIS — Z885 Allergy status to narcotic agent status: Secondary | ICD-10-CM

## 2019-11-16 DIAGNOSIS — F411 Generalized anxiety disorder: Secondary | ICD-10-CM | POA: Diagnosis present

## 2019-11-16 DIAGNOSIS — Z419 Encounter for procedure for purposes other than remedying health state, unspecified: Secondary | ICD-10-CM

## 2019-11-16 DIAGNOSIS — M5417 Radiculopathy, lumbosacral region: Secondary | ICD-10-CM | POA: Diagnosis not present

## 2019-11-16 DIAGNOSIS — E785 Hyperlipidemia, unspecified: Secondary | ICD-10-CM | POA: Diagnosis not present

## 2019-11-16 DIAGNOSIS — J302 Other seasonal allergic rhinitis: Secondary | ICD-10-CM | POA: Diagnosis not present

## 2019-11-16 DIAGNOSIS — Z888 Allergy status to other drugs, medicaments and biological substances status: Secondary | ICD-10-CM | POA: Diagnosis not present

## 2019-11-16 DIAGNOSIS — E559 Vitamin D deficiency, unspecified: Secondary | ICD-10-CM | POA: Diagnosis present

## 2019-11-16 DIAGNOSIS — Z981 Arthrodesis status: Secondary | ICD-10-CM

## 2019-11-16 DIAGNOSIS — I10 Essential (primary) hypertension: Secondary | ICD-10-CM | POA: Diagnosis present

## 2019-11-16 DIAGNOSIS — Z9049 Acquired absence of other specified parts of digestive tract: Secondary | ICD-10-CM | POA: Diagnosis not present

## 2019-11-16 DIAGNOSIS — Z90710 Acquired absence of both cervix and uterus: Secondary | ICD-10-CM

## 2019-11-16 DIAGNOSIS — Z882 Allergy status to sulfonamides status: Secondary | ICD-10-CM | POA: Diagnosis not present

## 2019-11-16 DIAGNOSIS — Z91013 Allergy to seafood: Secondary | ICD-10-CM

## 2019-11-16 DIAGNOSIS — Z79899 Other long term (current) drug therapy: Secondary | ICD-10-CM

## 2019-11-16 DIAGNOSIS — M961 Postlaminectomy syndrome, not elsewhere classified: Secondary | ICD-10-CM | POA: Diagnosis not present

## 2019-11-16 DIAGNOSIS — Z91040 Latex allergy status: Secondary | ICD-10-CM

## 2019-11-16 DIAGNOSIS — M4326 Fusion of spine, lumbar region: Secondary | ICD-10-CM | POA: Diagnosis not present

## 2019-11-16 DIAGNOSIS — Z189 Retained foreign body fragments, unspecified material: Secondary | ICD-10-CM | POA: Diagnosis not present

## 2019-11-16 DIAGNOSIS — M4807 Spinal stenosis, lumbosacral region: Secondary | ICD-10-CM | POA: Diagnosis not present

## 2019-11-16 HISTORY — PX: ABDOMINAL EXPOSURE: SHX5708

## 2019-11-16 HISTORY — PX: ANTERIOR LUMBAR FUSION: SHX1170

## 2019-11-16 HISTORY — DX: Arthrodesis status: Z98.1

## 2019-11-16 SURGERY — ANTERIOR LUMBAR FUSION 1 LEVEL
Anesthesia: General | Site: Spine Lumbar

## 2019-11-16 MED ORDER — CEFAZOLIN SODIUM-DEXTROSE 2-4 GM/100ML-% IV SOLN
2.0000 g | Freq: Three times a day (TID) | INTRAVENOUS | Status: AC
  Administered 2019-11-16 (×2): 2 g via INTRAVENOUS
  Filled 2019-11-16 (×2): qty 100

## 2019-11-16 MED ORDER — ROCURONIUM BROMIDE 10 MG/ML (PF) SYRINGE
PREFILLED_SYRINGE | INTRAVENOUS | Status: DC | PRN
  Administered 2019-11-16: 100 mg via INTRAVENOUS
  Administered 2019-11-16: 20 mg via INTRAVENOUS

## 2019-11-16 MED ORDER — SCOPOLAMINE 1 MG/3DAYS TD PT72
MEDICATED_PATCH | TRANSDERMAL | Status: AC
  Filled 2019-11-16: qty 1

## 2019-11-16 MED ORDER — THROMBIN 20000 UNITS EX SOLR
CUTANEOUS | Status: DC | PRN
  Administered 2019-11-16: 20 mL via TOPICAL

## 2019-11-16 MED ORDER — PHENOL 1.4 % MT LIQD
1.0000 | OROMUCOSAL | Status: DC | PRN
  Administered 2019-11-16: 1 via OROMUCOSAL
  Filled 2019-11-16: qty 177

## 2019-11-16 MED ORDER — METHOCARBAMOL 1000 MG/10ML IJ SOLN
500.0000 mg | Freq: Four times a day (QID) | INTRAVENOUS | Status: DC | PRN
  Filled 2019-11-16: qty 5

## 2019-11-16 MED ORDER — OXYCODONE HCL 5 MG PO TABS
ORAL_TABLET | ORAL | Status: AC
  Filled 2019-11-16: qty 1

## 2019-11-16 MED ORDER — FENTANYL CITRATE (PF) 100 MCG/2ML IJ SOLN
25.0000 ug | INTRAMUSCULAR | Status: DC | PRN
  Administered 2019-11-16: 25 ug via INTRAVENOUS
  Administered 2019-11-16: 50 ug via INTRAVENOUS
  Administered 2019-11-16 (×2): 25 ug via INTRAVENOUS

## 2019-11-16 MED ORDER — HYDROMORPHONE HCL 1 MG/ML IJ SOLN
INTRAMUSCULAR | Status: DC | PRN
  Administered 2019-11-16: .5 mg via INTRAVENOUS

## 2019-11-16 MED ORDER — MELOXICAM 7.5 MG PO TABS
15.0000 mg | ORAL_TABLET | Freq: Every day | ORAL | Status: DC
  Filled 2019-11-16 (×2): qty 2

## 2019-11-16 MED ORDER — HYDROMORPHONE HCL 1 MG/ML IJ SOLN
INTRAMUSCULAR | Status: AC
  Filled 2019-11-16: qty 0.5

## 2019-11-16 MED ORDER — THROMBIN 5000 UNITS EX SOLR
CUTANEOUS | Status: AC
  Filled 2019-11-16: qty 5000

## 2019-11-16 MED ORDER — FENTANYL CITRATE (PF) 100 MCG/2ML IJ SOLN
INTRAMUSCULAR | Status: AC
  Filled 2019-11-16: qty 2

## 2019-11-16 MED ORDER — 0.9 % SODIUM CHLORIDE (POUR BTL) OPTIME
TOPICAL | Status: DC | PRN
  Administered 2019-11-16 (×2): 1000 mL

## 2019-11-16 MED ORDER — CHLORHEXIDINE GLUCONATE 4 % EX LIQD: 60.0000 mL | Freq: Once | CUTANEOUS | Status: DC

## 2019-11-16 MED ORDER — ACETAMINOPHEN 325 MG PO TABS
650.0000 mg | ORAL_TABLET | ORAL | Status: DC | PRN
  Administered 2019-11-16: 650 mg via ORAL
  Filled 2019-11-16: qty 2

## 2019-11-16 MED ORDER — SODIUM CHLORIDE 0.9 % IV SOLN: 250.0000 mL | INTRAVENOUS | Status: DC

## 2019-11-16 MED ORDER — VENLAFAXINE HCL ER 37.5 MG PO CP24
37.5000 mg | ORAL_CAPSULE | Freq: Every day | ORAL | Status: DC
  Administered 2019-11-16: 37.5 mg via ORAL
  Filled 2019-11-16 (×2): qty 1

## 2019-11-16 MED ORDER — LIDOCAINE 2% (20 MG/ML) 5 ML SYRINGE
INTRAMUSCULAR | Status: DC | PRN
  Administered 2019-11-16: 60 mg via INTRAVENOUS

## 2019-11-16 MED ORDER — CEFAZOLIN SODIUM-DEXTROSE 2-4 GM/100ML-% IV SOLN
INTRAVENOUS | Status: AC
  Filled 2019-11-16: qty 100

## 2019-11-16 MED ORDER — CHLORHEXIDINE GLUCONATE CLOTH 2 % EX PADS: 6.0000 | MEDICATED_PAD | Freq: Once | CUTANEOUS | Status: DC

## 2019-11-16 MED ORDER — ACETAMINOPHEN 650 MG RE SUPP: 650.0000 mg | RECTAL | Status: DC | PRN

## 2019-11-16 MED ORDER — METHOCARBAMOL 500 MG PO TABS
ORAL_TABLET | ORAL | Status: AC
  Filled 2019-11-16: qty 1

## 2019-11-16 MED ORDER — LIDOCAINE 2% (20 MG/ML) 5 ML SYRINGE
INTRAMUSCULAR | Status: AC
  Filled 2019-11-16: qty 5

## 2019-11-16 MED ORDER — SODIUM CHLORIDE 0.9% FLUSH
3.0000 mL | Freq: Two times a day (BID) | INTRAVENOUS | Status: DC
  Administered 2019-11-16: 3 mL via INTRAVENOUS

## 2019-11-16 MED ORDER — DEXAMETHASONE SODIUM PHOSPHATE 10 MG/ML IJ SOLN
10.0000 mg | Freq: Once | INTRAMUSCULAR | Status: AC
  Administered 2019-11-16: 10 mg via INTRAVENOUS

## 2019-11-16 MED ORDER — SODIUM CHLORIDE 0.9 % IV SOLN
INTRAVENOUS | Status: DC | PRN
  Administered 2019-11-16: 500 mL

## 2019-11-16 MED ORDER — METHOCARBAMOL 500 MG PO TABS
500.0000 mg | ORAL_TABLET | Freq: Four times a day (QID) | ORAL | Status: DC | PRN
  Administered 2019-11-16 – 2019-11-17 (×4): 500 mg via ORAL
  Filled 2019-11-16 (×3): qty 1

## 2019-11-16 MED ORDER — SCOPOLAMINE 1 MG/3DAYS TD PT72
MEDICATED_PATCH | TRANSDERMAL | Status: DC | PRN
  Administered 2019-11-16: 1 via TRANSDERMAL

## 2019-11-16 MED ORDER — DEXAMETHASONE SODIUM PHOSPHATE 10 MG/ML IJ SOLN
INTRAMUSCULAR | Status: AC
  Filled 2019-11-16: qty 1

## 2019-11-16 MED ORDER — MIDAZOLAM HCL 5 MG/5ML IJ SOLN
INTRAMUSCULAR | Status: DC | PRN
  Administered 2019-11-16: 2 mg via INTRAVENOUS

## 2019-11-16 MED ORDER — PROMETHAZINE HCL 25 MG/ML IJ SOLN
INTRAMUSCULAR | Status: AC
  Administered 2019-11-16: 6.25 mg
  Filled 2019-11-16: qty 1

## 2019-11-16 MED ORDER — LACTATED RINGERS IV SOLN: INTRAVENOUS | Status: DC | PRN

## 2019-11-16 MED ORDER — HYDROXYZINE HCL 50 MG/ML IM SOLN
50.0000 mg | Freq: Four times a day (QID) | INTRAMUSCULAR | Status: DC | PRN
  Administered 2019-11-16: 50 mg via INTRAMUSCULAR
  Filled 2019-11-16: qty 1

## 2019-11-16 MED ORDER — OXYCODONE HCL 5 MG PO TABS
5.0000 mg | ORAL_TABLET | ORAL | Status: DC | PRN
  Administered 2019-11-16 – 2019-11-17 (×7): 5 mg via ORAL
  Filled 2019-11-16 (×6): qty 1

## 2019-11-16 MED ORDER — PROMETHAZINE HCL 25 MG/ML IJ SOLN
6.2500 mg | INTRAMUSCULAR | Status: DC | PRN
Start: 2019-11-16 — End: 2019-11-16

## 2019-11-16 MED ORDER — THROMBIN 5000 UNITS EX SOLR
OROMUCOSAL | Status: DC | PRN
  Administered 2019-11-16: 5 mL via TOPICAL

## 2019-11-16 MED ORDER — KETAMINE HCL 50 MG/5ML IJ SOSY
PREFILLED_SYRINGE | INTRAMUSCULAR | Status: AC
  Filled 2019-11-16: qty 5

## 2019-11-16 MED ORDER — FENTANYL CITRATE (PF) 250 MCG/5ML IJ SOLN
INTRAMUSCULAR | Status: AC
  Filled 2019-11-16: qty 5

## 2019-11-16 MED ORDER — PHENYLEPHRINE HCL-NACL 10-0.9 MG/250ML-% IV SOLN
INTRAVENOUS | Status: DC | PRN
  Administered 2019-11-16: 25 ug/min via INTRAVENOUS

## 2019-11-16 MED ORDER — MORPHINE SULFATE (PF) 2 MG/ML IV SOLN
2.0000 mg | INTRAVENOUS | Status: DC | PRN
  Administered 2019-11-16: 2 mg via INTRAVENOUS
  Filled 2019-11-16: qty 1

## 2019-11-16 MED ORDER — ONDANSETRON HCL 4 MG/2ML IJ SOLN
INTRAMUSCULAR | Status: AC
  Administered 2019-11-16: 4 mg
  Filled 2019-11-16: qty 2

## 2019-11-16 MED ORDER — LACTATED RINGERS IV SOLN
INTRAVENOUS | Status: DC | PRN
Start: 2019-11-16 — End: 2019-11-16

## 2019-11-16 MED ORDER — MENTHOL 3 MG MT LOZG: 1.0000 | LOZENGE | OROMUCOSAL | Status: DC | PRN

## 2019-11-16 MED ORDER — SENNA 8.6 MG PO TABS
1.0000 | ORAL_TABLET | Freq: Two times a day (BID) | ORAL | Status: DC
  Administered 2019-11-16 – 2019-11-17 (×3): 8.6 mg via ORAL
  Filled 2019-11-16 (×3): qty 1

## 2019-11-16 MED ORDER — ONDANSETRON HCL 4 MG PO TABS: 4.0000 mg | ORAL_TABLET | Freq: Four times a day (QID) | ORAL | Status: DC | PRN

## 2019-11-16 MED ORDER — CEFAZOLIN SODIUM-DEXTROSE 2-4 GM/100ML-% IV SOLN
2.0000 g | INTRAVENOUS | Status: AC
  Administered 2019-11-16: 2 g via INTRAVENOUS

## 2019-11-16 MED ORDER — FENTANYL CITRATE (PF) 250 MCG/5ML IJ SOLN
INTRAMUSCULAR | Status: DC | PRN
  Administered 2019-11-16: 50 ug via INTRAVENOUS
  Administered 2019-11-16: 100 ug via INTRAVENOUS
  Administered 2019-11-16 (×2): 50 ug via INTRAVENOUS

## 2019-11-16 MED ORDER — SUGAMMADEX SODIUM 200 MG/2ML IV SOLN
INTRAVENOUS | Status: DC | PRN
  Administered 2019-11-16: 200 mg via INTRAVENOUS

## 2019-11-16 MED ORDER — PROPOFOL 10 MG/ML IV BOLUS
INTRAVENOUS | Status: AC
  Filled 2019-11-16: qty 40

## 2019-11-16 MED ORDER — ONDANSETRON HCL 4 MG/2ML IJ SOLN
INTRAMUSCULAR | Status: AC
  Filled 2019-11-16: qty 2

## 2019-11-16 MED ORDER — MIDAZOLAM HCL 2 MG/2ML IJ SOLN
INTRAMUSCULAR | Status: AC
  Filled 2019-11-16: qty 2

## 2019-11-16 MED ORDER — SODIUM CHLORIDE 0.9% FLUSH: 3.0000 mL | INTRAVENOUS | Status: DC | PRN

## 2019-11-16 MED ORDER — ONDANSETRON HCL 4 MG/2ML IJ SOLN: 4.0000 mg | Freq: Once | INTRAMUSCULAR | Status: DC

## 2019-11-16 MED ORDER — POTASSIUM CHLORIDE IN NACL 20-0.9 MEQ/L-% IV SOLN: INTRAVENOUS | Status: DC

## 2019-11-16 MED ORDER — THROMBIN 20000 UNITS EX SOLR
CUTANEOUS | Status: AC
  Filled 2019-11-16: qty 20000

## 2019-11-16 MED ORDER — EPHEDRINE SULFATE-NACL 50-0.9 MG/10ML-% IV SOSY
PREFILLED_SYRINGE | INTRAVENOUS | Status: DC | PRN
  Administered 2019-11-16: 5 mg via INTRAVENOUS
  Administered 2019-11-16: 10 mg via INTRAVENOUS

## 2019-11-16 MED ORDER — ROCURONIUM BROMIDE 10 MG/ML (PF) SYRINGE
PREFILLED_SYRINGE | INTRAVENOUS | Status: AC
  Filled 2019-11-16: qty 20

## 2019-11-16 MED ORDER — ACETAMINOPHEN 500 MG PO TABS
ORAL_TABLET | ORAL | Status: AC
  Administered 2019-11-16: 1000 mg via ORAL
  Filled 2019-11-16: qty 2

## 2019-11-16 MED ORDER — VENLAFAXINE HCL ER 75 MG PO CP24
150.0000 mg | ORAL_CAPSULE | Freq: Every day | ORAL | Status: DC
  Administered 2019-11-16: 150 mg via ORAL
  Filled 2019-11-16: qty 2

## 2019-11-16 MED ORDER — ONDANSETRON HCL 4 MG/2ML IJ SOLN
INTRAMUSCULAR | Status: DC | PRN
  Administered 2019-11-16: 4 mg via INTRAVENOUS

## 2019-11-16 MED ORDER — KETAMINE HCL 10 MG/ML IJ SOLN
INTRAMUSCULAR | Status: DC | PRN
  Administered 2019-11-16: 10 mg via INTRAVENOUS
  Administered 2019-11-16 (×2): 20 mg via INTRAVENOUS

## 2019-11-16 MED ORDER — EPHEDRINE 5 MG/ML INJ
INTRAVENOUS | Status: AC
  Filled 2019-11-16: qty 10

## 2019-11-16 MED ORDER — ONDANSETRON HCL 4 MG/2ML IJ SOLN: 4.0000 mg | Freq: Four times a day (QID) | INTRAMUSCULAR | Status: DC | PRN

## 2019-11-16 MED ORDER — AMLODIPINE BESYLATE 5 MG PO TABS
5.0000 mg | ORAL_TABLET | Freq: Every day | ORAL | Status: DC
  Administered 2019-11-16: 5 mg via ORAL
  Filled 2019-11-16: qty 1

## 2019-11-16 MED ORDER — ACETAMINOPHEN 500 MG PO TABS: 1000.0000 mg | ORAL_TABLET | Freq: Once | ORAL | Status: AC

## 2019-11-16 MED ORDER — GABAPENTIN 300 MG PO CAPS
300.0000 mg | ORAL_CAPSULE | Freq: Three times a day (TID) | ORAL | Status: DC
  Administered 2019-11-16 – 2019-11-17 (×3): 300 mg via ORAL
  Filled 2019-11-16 (×3): qty 1

## 2019-11-16 MED ORDER — PROPOFOL 10 MG/ML IV BOLUS
INTRAVENOUS | Status: DC | PRN
  Administered 2019-11-16: 150 mg via INTRAVENOUS

## 2019-11-16 SURGICAL SUPPLY — 90 items
ANCHOR LUMBAR 27 (Anchor) ×9 IMPLANT
APPLIER CLIP 11 MED OPEN (CLIP) ×3
BAG DECANTER FOR FLEXI CONT (MISCELLANEOUS) ×3 IMPLANT
BUR BARREL STRAIGHT FLUTE 4.0 (BURR) IMPLANT
BUR CARBIDE MATCH 3.0 (BURR) ×3 IMPLANT
CANISTER SUCT 3000ML PPV (MISCELLANEOUS) ×3 IMPLANT
CARTRIDGE OIL MAESTRO DRILL (MISCELLANEOUS) IMPLANT
CATH FOLEY LATEX FREE 16FR (CATHETERS) ×1
CATH FOLEY LF 16FR (CATHETERS) ×2 IMPLANT
CLIP APPLIE 11 MED OPEN (CLIP) ×2 IMPLANT
CLIP LIGATING EXTRA MED SLVR (CLIP) IMPLANT
CLIP LIGATING EXTRA SM BLUE (MISCELLANEOUS) IMPLANT
COVER WAND RF STERILE (DRAPES) IMPLANT
DERMABOND ADVANCED (GAUZE/BANDAGES/DRESSINGS) ×2
DERMABOND ADVANCED .7 DNX12 (GAUZE/BANDAGES/DRESSINGS) ×4 IMPLANT
DIFFUSER DRILL AIR PNEUMATIC (MISCELLANEOUS) IMPLANT
DRAPE C-ARM 42X72 X-RAY (DRAPES) ×3 IMPLANT
DRAPE INCISE 23X17 IOBAN STRL (DRAPES) ×1
DRAPE INCISE IOBAN 23X17 STRL (DRAPES) ×2 IMPLANT
DRAPE LAPAROTOMY 100X72X124 (DRAPES) ×3 IMPLANT
DURAPREP 26ML APPLICATOR (WOUND CARE) IMPLANT
ELECT BLADE 4.0 EZ CLEAN MEGAD (MISCELLANEOUS) ×3
ELECT REM PT RETURN 9FT ADLT (ELECTROSURGICAL) ×3
ELECTRODE BLDE 4.0 EZ CLN MEGD (MISCELLANEOUS) ×2 IMPLANT
ELECTRODE REM PT RTRN 9FT ADLT (ELECTROSURGICAL) ×2 IMPLANT
GAUZE 4X4 16PLY RFD (DISPOSABLE) IMPLANT
GAUZE SPONGE 4X4 12PLY STRL (GAUZE/BANDAGES/DRESSINGS) IMPLANT
GLOVE BIO SURGEON STRL SZ7.5 (GLOVE) IMPLANT
GLOVE BIO SURGEON STRL SZ8 (GLOVE) IMPLANT
GLOVE BIOGEL PI IND STRL 6.5 (GLOVE) ×4 IMPLANT
GLOVE BIOGEL PI IND STRL 7.0 (GLOVE) ×2 IMPLANT
GLOVE BIOGEL PI IND STRL 8 (GLOVE) IMPLANT
GLOVE BIOGEL PI INDICATOR 6.5 (GLOVE) ×2
GLOVE BIOGEL PI INDICATOR 7.0 (GLOVE) ×1
GLOVE BIOGEL PI INDICATOR 8 (GLOVE)
GLOVE SS BIOGEL STRL SZ 7.5 (GLOVE) IMPLANT
GLOVE SUPERSENSE BIOGEL SZ 7.5 (GLOVE)
GLOVE SURG SS PI 6.5 STRL IVOR (GLOVE) ×12 IMPLANT
GLOVE SURG SS PI 7.0 STRL IVOR (GLOVE) ×9 IMPLANT
GLOVE SURG SS PI 7.5 STRL IVOR (GLOVE) ×3 IMPLANT
GLOVE SURG SS PI 8.0 STRL IVOR (GLOVE) ×6 IMPLANT
GOWN STRL REUS W/ TWL LRG LVL3 (GOWN DISPOSABLE) ×6 IMPLANT
GOWN STRL REUS W/ TWL XL LVL3 (GOWN DISPOSABLE) ×6 IMPLANT
GOWN STRL REUS W/TWL 2XL LVL3 (GOWN DISPOSABLE) IMPLANT
GOWN STRL REUS W/TWL LRG LVL3 (GOWN DISPOSABLE) ×3
GOWN STRL REUS W/TWL XL LVL3 (GOWN DISPOSABLE) ×3
GRAFT TRINITY ELITE LGE HUMAN (Tissue) ×3 IMPLANT
HEMOSTAT POWDER KIT SURGIFOAM (HEMOSTASIS) ×3 IMPLANT
HEMOSTAT SNOW SURGICEL 2X4 (HEMOSTASIS) IMPLANT
INSERT FOGARTY 61MM (MISCELLANEOUS) IMPLANT
INSERT FOGARTY SM (MISCELLANEOUS) IMPLANT
KIT BASIN OR (CUSTOM PROCEDURE TRAY) ×3 IMPLANT
KIT TURNOVER KIT B (KITS) ×3 IMPLANT
LOOP VESSEL MAXI BLUE (MISCELLANEOUS) IMPLANT
LOOP VESSEL MINI RED (MISCELLANEOUS) IMPLANT
MARKER SKIN DUAL TIP RULER LAB (MISCELLANEOUS) ×3 IMPLANT
NEEDLE SPNL 18GX3.5 QUINCKE PK (NEEDLE) ×3 IMPLANT
NS IRRIG 1000ML POUR BTL (IV SOLUTION) ×3 IMPLANT
OIL CARTRIDGE MAESTRO DRILL (MISCELLANEOUS)
PACK LAMINECTOMY NEURO (CUSTOM PROCEDURE TRAY) ×3 IMPLANT
PAD ARMBOARD 7.5X6 YLW CONV (MISCELLANEOUS) ×9 IMPLANT
SPACER HEDRON IA 26X34X15 8D (Spacer) ×3 IMPLANT
SPONGE INTESTINAL PEANUT (DISPOSABLE) ×9 IMPLANT
SPONGE LAP 18X18 RF (DISPOSABLE) ×3 IMPLANT
SPONGE LAP 4X18 RFD (DISPOSABLE) IMPLANT
SPONGE SURGIFOAM ABS GEL 100 (HEMOSTASIS) ×3 IMPLANT
STAPLER VISISTAT 35W (STAPLE) IMPLANT
SUT PDS AB 1 CTX 36 (SUTURE) ×3 IMPLANT
SUT PROLENE 4 0 RB 1 (SUTURE)
SUT PROLENE 4-0 RB1 .5 CRCL 36 (SUTURE) IMPLANT
SUT PROLENE 5 0 CC1 (SUTURE) IMPLANT
SUT PROLENE 6 0 C 1 30 (SUTURE) IMPLANT
SUT PROLENE 6 0 CC (SUTURE) IMPLANT
SUT SILK 0 TIES 10X30 (SUTURE) IMPLANT
SUT SILK 2 0 TIES 10X30 (SUTURE) ×3 IMPLANT
SUT SILK 2 0SH CR/8 30 (SUTURE) IMPLANT
SUT SILK 3 0 TIES 17X18 (SUTURE)
SUT SILK 3 0SH CR/8 30 (SUTURE) IMPLANT
SUT SILK 3-0 18XBRD TIE BLK (SUTURE) IMPLANT
SUT VIC AB 0 CT1 18XCR BRD8 (SUTURE) ×2 IMPLANT
SUT VIC AB 0 CT1 27 (SUTURE) ×1
SUT VIC AB 0 CT1 27XBRD ANBCTR (SUTURE) ×2 IMPLANT
SUT VIC AB 0 CT1 8-18 (SUTURE) ×1
SUT VIC AB 2-0 CP2 18 (SUTURE) ×6 IMPLANT
SUT VIC AB 3-0 SH 8-18 (SUTURE) ×6 IMPLANT
SUT VICRYL 4-0 PS2 18IN ABS (SUTURE) IMPLANT
TOWEL GREEN STERILE (TOWEL DISPOSABLE) ×3 IMPLANT
TOWEL GREEN STERILE FF (TOWEL DISPOSABLE) ×3 IMPLANT
TRAY FOLEY MTR SLVR 16FR STAT (SET/KITS/TRAYS/PACK) IMPLANT
WATER STERILE IRR 1000ML POUR (IV SOLUTION) ×3 IMPLANT

## 2019-11-16 NOTE — H&P (Signed)
History and Physical Interval Note:  11/16/2019 7:18 AM  Elaine Young  has presented today for surgery, with the diagnosis of Failed back syndrome - stenosis- degenerative disc disease.  The various methods of treatment have been discussed with the patient and family. After consideration of risks, benefits and other options for treatment, the patient has consented to  Procedure(s): L5-S1 ALIF (N/A) ABDOMINAL EXPOSURE (N/A) as a surgical intervention.  The patient's history has been reviewed, patient examined, no change in status, stable for surgery.  I have reviewed the patient's chart and labs.  Questions were answered to the patient's satisfaction.    L5-S1 ALIF  Elaine Young  Patient name: Elaine Young MRN: [id number] DOB: [birth year] Sex: female  REASON FOR CONSULT: Evaluate for L5-S1 ALIF  HPI:  Elaine Young is a 59 y.o. female, with history of hypertension hyperlipidemia and chronic lower back pain that presents for planned L5-S1 ALIF. Patient reports that she has had lower back pain for some time with radiculopathy down both legs. She has been under the care of Dr. Yetta Barre and has failed conservative management. He subsequently recommended an anterior L5-S1 approach. Mri showed that she has got degenerative disc disease at L5-S1 with foraminal stenosis and has had a previous left L5-S1 microdiscectomy. She reports previous abdominal surgery includes C-section through a lower transverse incision as well as laparoscopic cholecystectomy.      Past Medical History:  Diagnosis Date  . Carpal tunnel syndrome   . Cervical disc disorder   . Essential hypertension   . Functional incontinence   . Generalized anxiety disorder   . Hyperlipidemia   . Lower extremity numbness   . Lumbar disc disease   . Migraines   . Seasonal allergies   . Vitamin D deficiency         Past Surgical History:  Procedure Laterality Date  . ABDOMINAL HYSTERECTOMY    . BLADDER  SUSPENSION  2005  . BREAST BIOPSY Bilateral yrs ago in [place]   high risk lesions, not cancer per pt  . BREAST CYST EXCISION Bilateral yrs ago in [place]   hard to see some excision areas        Family History  Problem Relation Age of Onset  . Hyperlipidemia Mother   . Breast cancer Mother 68  . Ovarian cancer Maternal Aunt   . Lung cancer Maternal Uncle   . Arthritis Maternal Grandmother   . Arthritis Maternal Grandfather   . Colon cancer Maternal Grandfather   . Arthritis Paternal Grandmother   . Hypertension Paternal Grandmother   . Diabetes Paternal Grandmother   . Arthritis Paternal Grandfather   . Diabetes Paternal Grandfather   . Arthritis Cousin   . Breast cancer Cousin    maternal side x2   SOCIAL HISTORY:  Social History        Socioeconomic History  . Marital status: Married    Spouse name: Not on file  . Number of children: Not on file  . Years of education: Not on file  . Highest education level: Not on file  Occupational History  . Not on file  Tobacco Use  . Smoking status: Never Smoker  . Smokeless tobacco: Never Used  Substance and Sexual Activity  . Alcohol use: Yes    Alcohol/week: 0.0 standard drinks  . Drug use: No  . Sexual activity: Not on file  Other Topics Concern  . Not on file  Social History Narrative   Married.   1  child. 1 grandchild.   Retired.    Enjoys reading.    Social Determinants of Health      Financial Resource Strain:   . Difficulty of Paying Living Expenses:   Food Insecurity:   . Worried About Charity fundraiser in the Last Year:   . Arboriculturist in the Last Year:   Transportation Needs:   . Film/video editor (Medical):   Marland Kitchen Lack of Transportation (Non-Medical):   Physical Activity:   . Days of Exercise per Week:   . Minutes of Exercise per Session:   Stress:   . Feeling of Stress :   Social Connections:   . Frequency of Communication with Friends and Family:   . Frequency of Social Gatherings with Friends  and Family:   . Attends Religious Services:   . Active Member of Clubs or Organizations:   . Attends Archivist Meetings:   Marland Kitchen Marital Status:   Intimate Partner Violence:   . Fear of Current or Ex-Partner:   . Emotionally Abused:   Marland Kitchen Physically Abused:   . Sexually Abused:         Allergies  Allergen Reactions  . Codeine Nausea And Vomiting  . Iodine Other (See Comments)    Redness around lips  . Latex Rash  . Lipitor [Atorvastatin] Rash  . Shellfish Allergy Swelling and Rash  . Sulfa Antibiotics Rash         Current Outpatient Medications  Medication Sig Dispense Refill  . acetaminophen (TYLENOL) 500 MG tablet Take 500 mg by mouth every 6 (six) hours as needed (for pain.).    Marland Kitchen amLODipine (NORVASC) 5 MG tablet TAKE 1 TABLET BY MOUTH DAILY (Patient taking differently: Take 5 mg by mouth at bedtime. ) 90 tablet 2  . gabapentin (NEURONTIN) 300 MG capsule Take 300 mg by mouth in the morning and at bedtime.     . meloxicam (MOBIC) 15 MG tablet Take 15 mg by mouth at bedtime.     . rosuvastatin (CRESTOR) 10 MG tablet TAKE 1 TABLET BY MOUTH DAILY (Patient taking differently: Take 10 mg by mouth at bedtime. ) 90 tablet 2  . venlafaxine XR (EFFEXOR-XR) 150 MG 24 hr capsule TAKE 1 CAPSULE BY MOUTH DAILY WITH BREAKFAST. Take with 37.5 mg capsule. (Patient taking differently: Take 150 mg by mouth at bedtime. TAKE 1 CAPSULE BY MOUTH DAILY WITH BREAKFAST. Take with 37.5 mg capsule.) 90 capsule 3  . venlafaxine XR (EFFEXOR-XR) 37.5 MG 24 hr capsule TAKE 1 CAPSULE BY MOUTH DAILY WITH BREAKFAST. TAKE WITH 150 MG CAPSULE. (Patient taking differently: Take 37.5 mg by mouth at bedtime. TAKE 1 CAPSULE BY MOUTH DAILY WITH BREAKFAST. TAKE WITH 150 MG CAPSULE.) 90 capsule 3   No current facility-administered medications for this visit.   REVIEW OF SYSTEMS:  [X]  denotes positive finding, [ ]  denotes negative finding  Cardiac  Comments:  Chest pain or chest pressure:    Shortness of breath  upon exertion:    Short of breath when lying flat:    Irregular heart rhythm:        Vascular    Pain in calf, thigh, or hip brought on by ambulation:    Pain in feet at night that wakes you up from your sleep:     Blood clot in your veins:    Leg swelling:         Pulmonary    Oxygen at home:    Productive cough:  Wheezing:         Neurologic    Sudden weakness in arms or legs:     Sudden numbness in arms or legs:     Sudden onset of difficulty speaking or slurred speech:    Temporary loss of vision in one eye:     Problems with dizziness:         Gastrointestinal    Blood in stool:     Vomited blood:         Genitourinary    Burning when urinating:     Blood in urine:        Psychiatric    Major depression:         Hematologic    Bleeding problems:    Problems with blood clotting too easily:        Skin    Rashes or ulcers:        Constitutional    Fever or chills:    PHYSICAL EXAM:     Vitals:   11/03/19 1501  BP: 122/77  Pulse: 93  Resp: 20  Temp: 98 F (36.7 C)  SpO2: 96%  Weight: 207 lb (93.9 kg)  Height: 5\' 8"  (1.727 m)   GENERAL: The patient is a well-nourished female, in no acute distress. The vital signs are documented above.  CARDIAC: There is a regular rate and rhythm.  VASCULAR:  Palpable femoral pulses both groins  Palpable dorsalis pedis posterior tibial pulses bilaterally  PULMONARY: There is good air exchange bilaterally without wheezing or rales.  ABDOMEN: Soft and non-tender with normal pitched bowel sounds.  MUSCULOSKELETAL: There are no major deformities or cyanosis.  NEUROLOGIC: No focal weakness or paresthesias are detected.  SKIN: There are no ulcers or rashes noted.  PSYCHIATRIC: The patient has a normal affect.  DATA:  I independently reviewed her MRI which shows the aortic bifurcation at L4 and the iliac vein bifurcation and L5.  Assessment/Plan:  59 year old female that presents for preop evaluation of planned L5-S1  ALIF with Dr. 46 in the setting of chronic degenerative disc disease and lower back pain. I think she would be a good candidate for anterior approach and I discussed steps of surgery including left paramedian incision over the left rectus with mobilization of the left rectus and then entering the retroperitoneal space and mobilizing the peritoneum ureter across midline and mobilization of the left iliac artery and vein. We talked about risk of injury to the above structures. I think she would be a good candidate and look forward to helping Dr. Yetta Barre.  Yetta Barre, MD  [facility]  Office: [phone]

## 2019-11-16 NOTE — Anesthesia Procedure Notes (Signed)
Procedure Name: Intubation Date/Time: 11/16/2019 7:49 AM Performed by: Waynard Edwards, CRNA Pre-anesthesia Checklist: Patient identified, Emergency Drugs available, Suction available and Patient being monitored Patient Re-evaluated:Patient Re-evaluated prior to induction Oxygen Delivery Method: Circle system utilized Preoxygenation: Pre-oxygenation with 100% oxygen Induction Type: IV induction Ventilation: Mask ventilation without difficulty Laryngoscope Size: Miller and 2 Grade View: Grade I Tube type: Oral Tube size: 7.0 mm Number of attempts: 1 Airway Equipment and Method: Stylet Placement Confirmation: ETT inserted through vocal cords under direct vision,  positive ETCO2 and breath sounds checked- equal and bilateral Secured at: 22 cm Tube secured with: Tape Dental Injury: Teeth and Oropharynx as per pre-operative assessment

## 2019-11-16 NOTE — Anesthesia Postprocedure Evaluation (Signed)
Anesthesia Post Note  Patient: Elaine Young  Procedure(s) Performed: LUMBAR FIVE-SACRAL ONE ANTERIOR LUMBAR INTERBODY FUSION (N/A Spine Lumbar) ABDOMINAL EXPOSURE (N/A Abdomen)     Patient location during evaluation: PACU Anesthesia Type: General Level of consciousness: awake and alert Pain management: pain level controlled Vital Signs Assessment: post-procedure vital signs reviewed and stable Respiratory status: spontaneous breathing, nonlabored ventilation, respiratory function stable and patient connected to nasal cannula oxygen Cardiovascular status: blood pressure returned to baseline and stable Postop Assessment: no apparent nausea or vomiting Anesthetic complications: no    Last Vitals:  Vitals:   11/16/19 1224 11/16/19 1302  BP: (!) 101/58 (!) 104/52  Pulse: 90 77  Resp: 14 16  Temp:  (!) 36.4 C  SpO2: 90% 96%    Last Pain:  Vitals:   11/16/19 1307  TempSrc:   PainSc: 4                  Luisana Lutzke L Sheronica Corey

## 2019-11-16 NOTE — Op Note (Signed)
Date: Nov 16, 2019  Preoperative diagnosis: Chronic lower back pain  Postoperative diagnosis: Same  Procedure: Anterior spine exposure at the L5-S1 disc space via left retroperitoneal approach  Surgeon: Dr. Cephus Shelling, MD  Co-surgeon: Dr. Marikay Alar, MD  Indication: Patient is a 59 year old female with chronic lower back pain with radiculopathy down both legs.  She has been under the care of Dr. Yetta Barre and has ultimately failed conservative management.  MRI showed degenerative disc disease at L5-S1 with foraminal stenosis.  She presents for planned L5-S1 ALIF after risk benefits discussed.  Findings: Transverse incision over the left rectus muscle over the L5-S1 disc space.  The left rectus muscle was circumferentially mobilized and entered lateral to the muscle into the retroperitoneal space and mobilized the left ureter and peritoneum across midline.  Subsequently mobilized the left iliac vein.  The middle sacral vessels were then divided between vessel clips.  Ultimately the L5-S1 disc space was confirmed on lateral fluoroscopy with a spinal needle and fixed retractors were placed.  Anesthesia: General  Details: Patient was taken to the operating room after informed consent was obtained.  She was placed on the operating table in supine position.  General endotracheal anesthesia was induced.  Subsequently used the fluoroscopic C arm in the lateral position to mark the L5-S1 disc space on the anterior abdominal wall over the left rectus.  Her abdominal wall was then prepped and draped in the standard sterile fashion.  Timeout was performed.  Initially made an incision over our mark transversely over the left rectus muscle.  Dissected down opened subcutaneous tissue with Bovie cautery and opened the anterior rectus sheath with Bovie cautery in transverse fashion.  Hemostats were used and small flaps were raised under the anterior rectus sheath and the left rectus muscle was  circumferentially mobilized.  I then went lateral to the muscle and entered the retroperitoneal space and the peritoneum was then mobilized across midline including the left ureter bluntly with a KD.  My co-surgeon Dr. Yetta Barre used hand-held Wiley retractors to pull the peritoneum and ureter across midline while we mobilized across the L5-S1 disc space.  Also placed a fixed Balfour retractor for added visualization with a lap pad in the wound.  This did require mobilization of the left iliac vein.  We also identified the left iliac artery including hypogastric artery.  Ultimately the middle sacral vessels were identified and mobilized bluntly and then clipped between two vessel clips and divided.  We then mobilized lateral to the disc space on both sides in order to get fixed retractors in place.  Once I was satisfied with the exposure we then placed a fixed Thompson retractor.  We used 150 reverse lip retractors on each side of the disc space and 200 length fixed malleable retractors cranial caudal.  Spinal needle was then inserted in the disc space and fluoroscopic C-arm was used in the lateral position to confirm that we were at the correct L5-S1 level.  Case was then turned over to Dr. Yetta Barre.  Please see his dictation for the remainder the case.  Complication: None  Condition: Stable  Cephus Shelling, MD [facility] Office: [phone]   Cephus Shelling

## 2019-11-16 NOTE — H&P (Signed)
Subjective: Patient is a 59 y.o. female admitted for alif. Onset of symptoms was several months ago, gradually worsening since that time.  The pain is rated severe, and is located at the across the lower back and radiates to legs. The pain is described as aching and occurs all day. The symptoms have been progressive. Symptoms are exacerbated by exercise. MRI or CT showed DDD L5-S1   Past Medical History:  Diagnosis Date  . Carpal tunnel syndrome   . Cervical disc disorder   . Complication of anesthesia   . Essential hypertension   . Functional incontinence   . Generalized anxiety disorder   . Hyperlipidemia   . Lower extremity numbness   . Lumbar disc disease   . Migraines   . PONV (postoperative nausea and vomiting)    per pt, she usually gets zofran and scopalamine patch   . Seasonal allergies   . Vitamin D deficiency     Past Surgical History:  Procedure Laterality Date  . ABDOMINAL HYSTERECTOMY    . BLADDER SUSPENSION  2005  . BREAST BIOPSY Bilateral yrs ago in [place]   high risk lesions, not cancer per pt  . BREAST CYST EXCISION Bilateral yrs ago in [place]   hard to see some excision areas  . CHOLECYSTECTOMY    . TONSILLECTOMY      Prior to Admission medications   Medication Sig Start Date End Date Taking? Authorizing Provider  acetaminophen (TYLENOL) 500 MG tablet Take 500 mg by mouth every 6 (six) hours as needed (for pain.).   Yes [provider]  amLODipine (NORVASC) 5 MG tablet TAKE 1 TABLET BY MOUTH  DAILY Patient taking differently: Take 5 mg by mouth at bedtime.  04/01/19  Yes Pleas Koch, NP  gabapentin (NEURONTIN) 300 MG capsule Take 300 mg by mouth in the morning and at bedtime.  10/13/19  Yes [provider]  meloxicam (MOBIC) 15 MG tablet Take 15 mg by mouth at bedtime.  10/13/19  Yes [provider]  rosuvastatin (CRESTOR) 10 MG tablet TAKE 1 TABLET BY MOUTH  DAILY Patient taking differently: Take 10 mg by mouth at bedtime.  04/01/19   Yes Pleas Koch, NP  venlafaxine XR (EFFEXOR-XR) 150 MG 24 hr capsule TAKE 1 CAPSULE BY MOUTH  DAILY WITH BREAKFAST. Take with 37.5 mg capsule. Patient taking differently: Take 150 mg by mouth at bedtime. TAKE 1 CAPSULE BY MOUTH  DAILY WITH BREAKFAST. Take with 37.5 mg capsule. 06/05/19  Yes Pleas Koch, NP  venlafaxine XR (EFFEXOR-XR) 37.5 MG 24 hr capsule TAKE 1 CAPSULE BY MOUTH  DAILY WITH BREAKFAST. TAKE  WITH 150 MG CAPSULE. Patient taking differently: Take 37.5 mg by mouth at bedtime. TAKE 1 CAPSULE BY MOUTH  DAILY WITH BREAKFAST. TAKE  WITH 150 MG CAPSULE. 06/05/19  Yes Pleas Koch, NP   Allergies  Allergen Reactions  . Codeine Nausea And Vomiting  . Iodine Other (See Comments)    Redness around lips  . Latex Rash  . Lipitor [Atorvastatin] Rash  . Shellfish Allergy Swelling and Rash  . Sulfa Antibiotics Rash    Social History   Tobacco Use  . Smoking status: Never Smoker  . Smokeless tobacco: Never Used  Substance Use Topics  . Alcohol use: Yes    Alcohol/week: 0.0 standard drinks    Family History  Problem Relation Age of Onset  . Hyperlipidemia Mother   . Breast cancer Mother 69  . Ovarian cancer Maternal Aunt   .  Lung cancer Maternal Uncle   . Arthritis Maternal Grandmother   . Arthritis Maternal Grandfather   . Colon cancer Maternal Grandfather   . Arthritis Paternal Grandmother   . Hypertension Paternal Grandmother   . Diabetes Paternal Grandmother   . Arthritis Paternal Grandfather   . Diabetes Paternal Grandfather   . Arthritis Cousin   . Breast cancer Cousin        maternal side x2     Review of Systems  Positive ROS: neg  All other systems have been reviewed and were otherwise negative with the exception of those mentioned in the HPI and as above.  Objective: Vital signs in last 24 hours: Temp:  [97.1 F (36.2 C)] 97.1 F (36.2 C) (05/17 0618) Pulse Rate:  [90] 90 (05/17 0618) Resp:  [20] 20 (05/17 0618) BP: (155)/(77) 155/77  (05/17 0618) SpO2:  [97 %] 97 % (05/17 0618) Weight:  [93.2 kg] 93.2 kg (05/17 0611)  General Appearance: Alert, cooperative, no distress, appears stated age Head: Normocephalic, without obvious abnormality, atraumatic Eyes: PERRL, conjunctiva/corneas clear, EOM's intact    Neck: Supple, symmetrical, trachea midline Back: Symmetric, no curvature, ROM normal, no CVA tenderness Lungs:  respirations unlabored Heart: Regular rate and rhythm Abdomen: Soft, non-tender Extremities: Extremities normal, atraumatic, no cyanosis or edema Pulses: 2+ and symmetric all extremities Skin: Skin color, texture, turgor normal, no rashes or lesions  NEUROLOGIC:   Mental status: Alert and oriented x4,  no aphasia, good attention span, fund of knowledge, and memory Motor Exam - grossly normal Sensory Exam - grossly normal Reflexes: 1+ Coordination - grossly normal Gait - grossly normal Balance - grossly normal Cranial Nerves: I: smell Not tested  II: visual acuity  OS: nl    OD: nl  II: visual fields Full to confrontation  II: pupils Equal, round, reactive to light  III,VII: ptosis None  III,IV,VI: extraocular muscles  Full ROM  V: mastication Normal  V: facial light touch sensation  Normal  V,VII: corneal reflex  Present  VII: facial muscle function - upper  Normal  VII: facial muscle function - lower Normal  VIII: hearing Not tested  IX: soft palate elevation  Normal  IX,X: gag reflex Present  XI: trapezius strength  5/5  XI: sternocleidomastoid strength 5/5  XI: neck flexion strength  5/5  XII: tongue strength  Normal    Data Review Lab Results  Component Value Date   WBC 5.7 11/12/2019   HGB 15.1 (H) 11/12/2019   HCT 45.4 11/12/2019   MCV 92.5 11/12/2019   PLT 292 11/12/2019   Lab Results  Component Value Date   NA 140 11/12/2019   K 3.7 11/12/2019   CL 107 11/12/2019   CO2 22 11/12/2019   BUN 12 11/12/2019   CREATININE 0.66 11/12/2019   GLUCOSE 120 (H) 11/12/2019   Lab  Results  Component Value Date   INR 1.0 11/12/2019    Assessment/Plan:  Estimated body mass index is 31.23 kg/m as calculated from the following:   Height as of this encounter: 5\' 8"  (1.727 m).   Weight as of this encounter: 93.2 kg. Patient admitted for ALIF L5-S1. Patient has failed a reasonable attempt at conservative therapy.  I explained the condition and procedure to the patient and answered any questions.  Patient wishes to proceed with procedure as planned. Understands risks/ benefits and typical outcomes of procedure.   11/16/2019 7:23 AM

## 2019-11-16 NOTE — Transfer of Care (Signed)
Immediate Anesthesia Transfer of Care Note  Patient: Elaine Young  Procedure(s) Performed: LUMBAR FIVE-SACRAL ONE ANTERIOR LUMBAR INTERBODY FUSION (N/A Spine Lumbar) ABDOMINAL EXPOSURE (N/A Abdomen)  Patient Location: PACU  Anesthesia Type:General  Level of Consciousness: drowsy  Airway & Oxygen Therapy: Patient Spontanous Breathing and Patient connected to nasal cannula oxygen  Post-op Assessment: Report given to RN and Post -op Vital signs reviewed and stable  Post vital signs: Reviewed and stable  Last Vitals:  Vitals Value Taken Time  BP    Temp    Pulse    Resp    SpO2      Last Pain:  Vitals:   11/16/19 0611  PainSc: 7       Patients Stated Pain Goal: 3 (11/16/19 7533)  Complications: No apparent anesthesia complications

## 2019-11-16 NOTE — Op Note (Signed)
11/16/2019  10:30 AM  PATIENT:  Elaine Young  59 y.o. female  PRE-OPERATIVE DIAGNOSIS: Failed back syndrome, degenerative disc disease L5-S1, recurrent disc herniation L5-S1, loss of lordosis, back and leg pain  POST-OPERATIVE DIAGNOSIS:  same  PROCEDURE: Anterior lumbar interbody fusion L5-S1 utilizing a globus 3D printed interbody cage packed with morselized allograft  SURGEON:  Marikay Alar, MD  Co-surgeon: Dr. Chestine Spore,  Vascular  ASSISTANTS: Verlin Dike, FNP  ANESTHESIA:   General  EBL: 250 ml  Total I/O In: 1900 [I.V.:1800; IV Piggyback:100] Out: 400 [Urine:150; Blood:250]  BLOOD ADMINISTERED: none  DRAINS: none  SPECIMEN:  none  INDICATION FOR PROCEDURE: This patient presented with back and leg pain.  She has had previous microdiscectomy at L5-S1 on the left.. Imaging showed small midline recurrent disc herniation and degenerative disc disease loss of disc base height and flattening of her lordosis. The patient tried conservative measures without relief. Pain was debilitating. Recommended anterior lumbar interbody fusion L5-S1. Patient understood the risks, benefits, and alternatives and potential outcomes and wished to proceed.  PROCEDURE DETAILS: The patient was taken the operating room and after induction of adequate generalized intricate anesthesia she was placed in the supine position on the operating table.  Her exposure was performed by Dr. Chestine Spore of vascular surgery and that will be described in a separate operative report.  Once the exposure was completed we placed a needle in the disc space and used AP fluoroscopy to mark her midline.  The disc space was incised and the disc was released from the endplates with a Cobb elevator.  The initial discectomy was done with pituitary rongeurs.  I then scraped the endplates with a curette.  I then completed the discectomy with pituitary rongeurs.  I used a high-speed drill to drill the endplates down to the level of  the posterior arch of the ligament and posterior osteophytes in order to prepare the endplates for arthrodesis.  I then used sequential anterior lumbar interbody fusion trial starting with a 8 mm trial and working up to the 15 mm medium sized 8 degree lordosis trial.  This fit the best.  We used a corresponding 3D printed cage impacted with morselized allograft and then tapped it into position on the inserter at L5-S1.  We used a lateral fluoroscopy to do so.  We then placed anchors through the cage into the vertebral body and sacrum.  We placed 1 anchor into L5 and 2 into the sacrum.  We then checked our construct with AP and lateral fluoroscopy after removing the inserter.  We locked the anchors into place with the locking mechanism.  We irrigated.  We packed morselized allograft adjacent to the cage.  We then slowly removed the retractors and look for any bleeding.  Small venous bleeders were coagulated with bipolar cautery.  We then checked an AP abdominal x-ray to make sure there are no retained instruments or sponges.  The radiologist read this as clear.  Then closed the rectus sheath with a running 0 Prolene suture.  We closed the subcutaneous tissues with 0 Vicryl in the subcuticular tissue with 3-0 Vicryl.  The skin was closed with Dermabond.  The drapes were removed.  A sterile dressing was applied.  The patient was awakened from general anesthesia and transported to the recovery room in stable condition.  At the end of the procedure all sponge needle and instrument counts were correct.   PLAN OF CARE: Admit to inpatient   PATIENT DISPOSITION:  PACU -  hemodynamically stable.   Delay start of Pharmacological VTE agent (>24hrs) due to surgical blood loss or risk of bleeding:  yes

## 2019-11-17 MED ORDER — METHOCARBAMOL 500 MG PO TABS: 500.0000 mg | ORAL_TABLET | Freq: Four times a day (QID) | ORAL | 1 refills | Status: DC | PRN

## 2019-11-17 MED ORDER — OXYCODONE HCL 5 MG PO TABS: 5.0000 mg | ORAL_TABLET | ORAL | 0 refills | Status: DC | PRN

## 2019-11-17 NOTE — Evaluation (Signed)
Physical Therapy Evaluation Patient Details Name: Elaine Young MRN: [id number] DOB: 03/18/1961 Today's Date: 11/17/2019   History of Present Illness  Patient is a 59 y/o female who presents s/p L5-S1 ALIF. PMH includes HLD, HTN, anxiety, migraines, carpal tunnel syndrome.  Clinical Impression  Patient presents with pain and post surgical deficits s/p above surgery. Pt independent and working part time at [employer]. Lives with spouse who can provide assist at d/c. Today, pt tolerated bed mobility, transfers, gait and stair training with Min A-Min guard for balance/safety. Pt very guarded with gait due to pain. Education re: back precautions, handout, brace, positioning, log roll technique and mobility expectations. Will follow acutely to maximize independence and mobility prior to return home. Will follow acutely.    Follow Up Recommendations No PT follow up;Supervision - Intermittent    Equipment Recommendations  None recommended by PT    Recommendations for Other Services       Precautions / Restrictions Precautions Precautions: Back Precaution Booklet Issued: Yes (comment) Precaution Comments: reviewed precautions and handout Required Braces or Orthoses: Spinal Brace Spinal Brace: Lumbar corset;Applied in standing position Restrictions Weight Bearing Restrictions: No      Mobility  Bed Mobility Overal bed mobility: Needs Assistance Bed Mobility: Rolling;Sidelying to Sit;Sit to Sidelying Rolling: Min guard Sidelying to sit: Min assist;HOB elevated     Sit to sidelying: Min guard General bed mobility comments: Cues for log roll technique; assist with trunk to get to EOB (reports her spouse will help her). HOB mostly flat. Increased time/effort, pain limiting.  Transfers Overall transfer level: Needs assistance Equipment used: 1 person hand held assist Transfers: Sit to/from Stand Sit to Stand: Min assist         General transfer comment: pt  reaching for UE support, min assist to power up from EOB   Ambulation/Gait Ambulation/Gait assistance: Min guard Gait Distance (Feet): 400 Feet Assistive device: None Gait Pattern/deviations: Step-through pattern;Decreased step length - right;Decreased step length - left;Decreased stride length Gait velocity: decreased Gait velocity interpretation: <1.31 ft/sec, indicative of household ambulator General Gait Details: Slow, guarded gait with decreased step lengths bilaterally. Limited by pain.  Stairs Stairs: Yes Stairs assistance: Min guard Stair Management: One rail Right;Step to pattern Number of Stairs: 13 General stair comments: Cues for technique and safety.  Wheelchair Mobility    Modified Rankin (Stroke Patients Only)       Balance Overall balance assessment: Mild deficits observed, not formally tested                                           Pertinent Vitals/Pain Pain Assessment: 0-10 Pain Score: 7  Faces Pain Scale: Hurts even more Pain Location: back/abdomen post surgical Pain Descriptors / Indicators: Discomfort;Grimacing;Operative site guarding Pain Intervention(s): Premedicated before session;Monitored during session;Repositioned;Limited activity within patient's tolerance    Home Living Family/patient expects to be discharged to:: Private residence Living Arrangements: Spouse/significant other Available Help at Discharge: Family;Available 24 hours/day Type of Home: Other(Comment)(townhome) Home Access: Level entry     Home Layout: Two level;1/2 bath on main level;Bed/bath upstairs;Able to live on main level with bedroom/bathroom Home Equipment: Adaptive equipment Additional Comments: patient reports shower chair coming today    Prior Function Level of Independence: Independent         Comments: driving, working part-time at [employer]  [employer]        Extremity/Trunk Assessment   Upper Extremity  Assessment Upper Extremity Assessment: Defer to OT evaluation    Lower Extremity Assessment Lower Extremity Assessment: Overall WFL for tasks assessed(Sensation WFLs BLEs)    Cervical / Trunk Assessment Cervical / Trunk Assessment: Other exceptions Cervical / Trunk Exceptions: s/p back surgery  Communication   Communication: No difficulties  Cognition Arousal/Alertness: Awake/alert Behavior During Therapy: WFL for tasks assessed/performed Overall Cognitive Status: Within Functional Limits for tasks assessed                                        General Comments      Exercises     Assessment/Plan    PT Assessment Patient needs continued PT services  PT Problem List Decreased mobility;Pain;Decreased balance;Decreased knowledge of precautions;Decreased activity tolerance;Decreased skin integrity       PT Treatment Interventions Therapeutic activities;Gait training;Stair training;Therapeutic exercise;Balance training;Patient/family education;Functional mobility training    PT Goals (Current goals can be found in the Care Plan section)  Acute Rehab PT Goals Patient Stated Goal: to go home PT Goal Formulation: With patient Time For Goal Achievement: 12/01/19 Potential to Achieve Goals: Good    Frequency Min 5X/week   Barriers to discharge        Co-evaluation               AM-PAC PT "6 Clicks" Mobility  Outcome Measure Help needed turning from your back to your side while in a flat bed without using bedrails?: A Little Help needed moving from lying on your back to sitting on the side of a flat bed without using bedrails?: A Little Help needed moving to and from a bed to a chair (including a wheelchair)?: A Little Help needed standing up from a chair using your arms (e.g., wheelchair or bedside chair)?: A Little Help needed to walk in hospital room?: A Little Help needed climbing 3-5 steps with a railing? : A Little 6 Click Score: 18    End  of Session Equipment Utilized During Treatment: Back brace Activity Tolerance: Patient limited by pain;Patient tolerated treatment well Patient left: in bed;with call bell/phone within reach Nurse Communication: Mobility status PT Visit Diagnosis: Pain;Difficulty in walking, not elsewhere classified (R26.2) Pain - part of body: (back/abdomen incision)    Time: 9323-5573 PT Time Calculation (min) (ACUTE ONLY): 17 min   Charges:   PT Evaluation $PT Eval Moderate Complexity: 1 Mod          Marisa Severin, PT, DPT Acute Rehabilitation Services Pager [phone] Office [place] 11/17/2019, 10:42 AM

## 2019-11-17 NOTE — Progress Notes (Signed)
Pt doing well. Pt and family given D/C instructions with verbal understanding. Rx's were sent to the pharmacy by MD. Pt's incision is clean and dry with no sign of infection. Pt's IV was removed prior to D/C. Pt D/C'd home via wheelchair per MD order. Pt is stable @ D/C and has no other needs at this time. Dazja Houchin, RN  

## 2019-11-17 NOTE — Progress Notes (Signed)
[facility]  Subjective  - tolerating PO.  Feels good.   Objective (!) 106/55 93 98.6 F (37 C) (Oral) 20 92%  Intake/Output Summary (Last 24 hours) at 11/17/2019 0717 Last data filed at 11/16/2019 2108 Gross per 24 hour  Intake 2123 ml  Output 400 ml  Net 1723 ml   Abdomen soft.  Transverse abdominal incision with some bruising, no hematoma. Left DP palpable  Laboratory Lab Results: No results for input(s): WBC, HGB, HCT, PLT in the last 72 hours. BMET No results for input(s): NA, K, CL, CO2, GLUCOSE, BUN, CREATININE, CALCIUM in the last 72 hours.  COAG Lab Results  Component Value Date   INR 1.0 11/12/2019   No results found for: PTT  Assessment/Planning: POD #1 s/p L5-S1 ALIF.  No acute events overnight.  Abdomen soft and tolerating PO.  Palpable left pedal pulse.  Looks good.  Discharge per neurosurgery.  Cephus Shelling 11/17/2019 7:17 AM --

## 2019-11-17 NOTE — Evaluation (Addendum)
Occupational Therapy Evaluation and Discharge Patient Details Name: Elaine Young MRN: [id number] DOB: 11-22-1960 Today's Date: 11/17/2019    History of Present Illness Patient is a 59 y/o female who presents s/p L5-S1 ALIF. PMH includes HLD, HTN, anxiety, migraines, carpal tunnel syndrome.   Clinical Impression   PTA patient independent and working part time, driving. Admitted for above and limited by back/abdominal pain, decreased functional reach to B LEs, back precautions.  She currently requires min assist for transfers, supervision for in room mobility, supervision to mod assist for ADLS.  Patient reports her spouse is available 24/7 and plans to assist with LB ADLs as needed, verbalizes understanding of compensatory techniques, AE and recommendations. She was educated on back precautions, brace mgmt and wear schedule, safety, ADL compensatory techniques, DME/AE, recommendations and mobility progression.  Based on performance today and 24/7 assist available at home, no further OT needs have been identified and OT will sign off.  Thank you for this referral.     Follow Up Recommendations  No OT follow up;Supervision/Assistance - 24 hour    Equipment Recommendations  None recommended by OT(pt reports getting shower chair toda y)    Recommendations for Other Services       Precautions / Restrictions Precautions Precautions: Back Precaution Booklet Issued: Yes (comment) Precaution Comments: reviewed precautions with pt  Required Braces or Orthoses: Spinal Brace Spinal Brace: Lumbar corset;Applied in standing position Restrictions Weight Bearing Restrictions: No      Mobility Bed Mobility Overal bed mobility: Needs Assistance Bed Mobility: Rolling;Sidelying to Sit;Sit to Sidelying Rolling: Min guard Sidelying to sit: Min guard     Sit to sidelying: Min assist General bed mobility comments: cueing to ensure log roll technique, min guard to ascend trunk to sitting and  min assist to guide trunk back to sidelying; increased time and effort, limited by pain   Transfers Overall transfer level: Needs assistance Equipment used: 1 person hand held assist Transfers: Sit to/from Stand Sit to Stand: Min assist         General transfer comment: pt reaching for UE support, min assist to power up from EOB     Balance Overall balance assessment: Mild deficits observed, not formally tested                                         ADL either performed or assessed with clinical judgement   ADL Overall ADL's : Needs assistance/impaired     Grooming: Supervision/safety;Standing   Upper Body Bathing: Set up;Sitting   Lower Body Bathing: Minimal assistance;Sit to/from stand;Cueing for back precautions;Cueing for compensatory techniques Lower Body Bathing Details (indicate cue type and reason): requires assist to reach B LEs, unable to complete figure 4 technique; educated on use of Long sponge seated Upper Body Dressing : Sitting;Standing;Supervision/safety Upper Body Dressing Details (indicate cue type and reason): min cueing for brace mgmt  Lower Body Dressing: Moderate assistance;Sit to/from stand;Cueing for back precautions;Cueing for compensatory techniques Lower Body Dressing Details (indicate cue type and reason): pt unable to reach B feet using figure 4 technique, reports having reacher at home for pants; plans to have spouse assist for socks or wear slip on shoes  Toilet Transfer: Minimal assistance;Ambulation Toilet Transfer Details (indicate cue type and reason): min assist to power up from EOB-simulated in room   Toileting - Clothing Manipulation Details (indicate cue type and reason): discussed techniques to  avoid twisting during toileting  Tub/ Shower Transfer: Walk-in shower;Min guard;Ambulation Tub/Shower Transfer Details (indicate cue type and reason): simulated in room, using UE support on wall stepping over threhold  sideways Functional mobility during ADLs: Supervision/safety General ADL Comments: pt limited by pain and back precautions; educated on compensatory techniques, brace mgmt and wear schedule, safety, and ADL comepnsatory techniques      Vision   Vision Assessment?: No apparent visual deficits     Perception     Praxis      Pertinent Vitals/Pain Pain Assessment: Faces Faces Pain Scale: Hurts even more Pain Location: back/abdomen post surgical Pain Descriptors / Indicators: Discomfort;Grimacing;Operative site guarding Pain Intervention(s): Limited activity within patient's tolerance;Monitored during session;Repositioned;Other (comment)(utilized bracing abdomen during bed mobility )     Hand Dominance     Extremity/Trunk Assessment Upper Extremity Assessment Upper Extremity Assessment: Overall WFL for tasks assessed   Lower Extremity Assessment Lower Extremity Assessment: Defer to PT evaluation   Cervical / Trunk Assessment Cervical / Trunk Assessment: Other exceptions Cervical / Trunk Exceptions: s/p back surgery   Communication Communication Communication: No difficulties   Cognition Arousal/Alertness: Awake/alert Behavior During Therapy: WFL for tasks assessed/performed Overall Cognitive Status: Within Functional Limits for tasks assessed                                     General Comments       Exercises     Shoulder Instructions      Home Living Family/patient expects to be discharged to:: Private residence Living Arrangements: Spouse/significant other Available Help at Discharge: Family;Available 24 hours/day Type of Home: Other(Comment)(Townhome) Home Access: Level entry     Home Layout: Two level;1/2 bath on main level;Bed/bath upstairs;Able to live on main level with bedroom/bathroom(couch on first floor) Alternate Level Stairs-Number of Steps: flight Alternate Level Stairs-Rails: (plus rail) Bathroom Shower/Tub: Medical illustrator: Standard(handicapped height upstairs)     Home Equipment: Radiation protection practitioner Equipment: Reacher Additional Comments: patient reports shower chair coming today      Prior Functioning/Environment Level of Independence: Independent        Comments: driving, working part-time at [employer]        OT Problem List: Decreased knowledge of use of DME or AE;Decreased knowledge of precautions;Pain;Decreased activity tolerance      OT Treatment/Interventions:      OT Goals(Current goals can be found in the care plan section) Acute Rehab OT Goals Patient Stated Goal: less pain, home  OT Goal Formulation: With patient  OT Frequency:     Barriers to D/C:            Co-evaluation              AM-PAC OT "6 Clicks" Daily Activity     Outcome Measure Help from another person eating meals?: None Help from another person taking care of personal grooming?: A Little Help from another person toileting, which includes using toliet, bedpan, or urinal?: A Little Help from another person bathing (including washing, rinsing, drying)?: A Lot Help from another person to put on and taking off regular upper body clothing?: A Little Help from another person to put on and taking off regular lower body clothing?: A Lot 6 Click Score: 17   End of Session Equipment Utilized During Treatment: Back brace Nurse Communication: Mobility status  Activity Tolerance: Patient tolerated treatment well Patient left:  in bed;with call bell/phone within reach  OT Visit Diagnosis: Pain;Other abnormalities of gait and mobility (R26.89) Pain - part of body: (back/abd)                Time: 0938-1829 OT Time Calculation (min): 19 min Charges:  OT General Charges $OT Visit: 1 Visit OT Evaluation $OT Eval Low Complexity: 1 Low  Barry Brunner, OT Acute Rehabilitation Services Pager [phone] Office [phone]   Chancy Milroy 11/17/2019, 9:22 AM

## 2019-11-17 NOTE — Discharge Summary (Signed)
Physician Discharge Summary  Patient ID: Elaine Young MRN: [id number] DOB/AGE: 60-08-1960 60 y.o.  Admit date: 11/16/2019 Discharge date: 11/17/2019  Admission Diagnoses: DDD/ HNP L5-S1    Discharge Diagnoses: same   Discharged Condition: good  Hospital Course: The patient was admitted on 11/16/2019 and taken to the operating room where the patient underwent ALIF L5-S1. The patient tolerated the procedure well and was taken to the recovery room and then to the floor in stable condition. The hospital course was routine. There were no complications. The wound remained clean dry and intact. Pt had appropriate back soreness. No complaints of leg pain or new N/T/W. The patient remained afebrile with stable vital signs, and tolerated a regular diet. The patient continued to increase activities, and pain was well controlled with oral pain medications.   Consults: None  Significant Diagnostic Studies:  Results for orders placed or performed during the hospital encounter of 11/12/19  SARS CORONAVIRUS 2 (TAT 6-24 HRS) Nasopharyngeal Nasopharyngeal Swab   Specimen: Nasopharyngeal Swab  Result Value Ref Range   SARS Coronavirus 2 NEGATIVE NEGATIVE    Chest 2 View  Result Date: 11/12/2019 CLINICAL DATA:  Preoperative evaluation for upcoming lumbar surgery, initial encounter EXAM: CHEST - 2 VIEW COMPARISON:  None. FINDINGS: The heart size and mediastinal contours are within normal limits. Both lungs are clear. The visualized skeletal structures are unremarkable. IMPRESSION: No active cardiopulmonary disease. Electronically Signed   By: Alcide Clever M.D.   On: 11/12/2019 20:35   DG Lumbar Spine 2-3 Views  Result Date: 11/16/2019 CLINICAL DATA:  Anterior fusion at L5-S1 EXAM: LUMBAR SPINE - 2-3 VIEW; DG C-ARM 1-60 MIN COMPARISON:  None. FINDINGS: Multiple intraoperative fluoroscopic spot images are provided. Interval anterior lumbar interbody fusion at L5-S1. Normal alignment. FLUOROSCOPY TIME:   56 secs IMPRESSION: Intraoperative localization. Electronically Signed   By: Elige Ko   On: 11/16/2019 10:29   DG C-Arm 1-60 Min  Result Date: 11/16/2019 CLINICAL DATA:  Anterior fusion at L5-S1 EXAM: LUMBAR SPINE - 2-3 VIEW; DG C-ARM 1-60 MIN COMPARISON:  None. FINDINGS: Multiple intraoperative fluoroscopic spot images are provided. Interval anterior lumbar interbody fusion at L5-S1. Normal alignment. FLUOROSCOPY TIME:  56 secs IMPRESSION: Intraoperative localization. Electronically Signed   By: Elige Ko   On: 11/16/2019 10:29   DG OR LOCAL ABDOMEN  Result Date: 11/16/2019 CLINICAL DATA:  Patient in the operating room following lumbar interbody fusion surgery. Evaluate for retained instrument, sponge or needle. EXAM: OR LOCAL ABDOMEN COMPARISON:  None. FINDINGS: There is no evidence of a retained surgical instrument, sponge, needle or other unexpected radiopaque foreign body. Interbody fusion hardware lies at L5-S1. IMPRESSION: No evidence of a retained surgical instrument, needle or sponge other unexpected foreign body. Results were called to the operating room at the time of this dictation. Electronically Signed   By: Amie Portland M.D.   On: 11/16/2019 10:01    Antibiotics:  Anti-infectives (From admission, onward)   Start     Dose/Rate Route Frequency Ordered Stop   11/16/19 1600  ceFAZolin (ANCEF) IVPB 2g/100 mL premix     2 g 200 mL/hr over 30 Minutes Intravenous Every 8 hours 11/16/19 1253 11/17/19 0005   11/16/19 0844  bacitracin 50,000 Units in sodium chloride 0.9 % 500 mL irrigation  Status:  Discontinued       As needed 11/16/19 0844 11/16/19 1018   11/16/19 0603  ceFAZolin (ANCEF) 2-4 GM/100ML-% IVPB    Note to Pharmacy: Blair Promise   :  cabinet override      11/16/19 0603 11/16/19 0808   11/16/19 0600  ceFAZolin (ANCEF) IVPB 2g/100 mL premix     2 g 200 mL/hr over 30 Minutes Intravenous On call to O.R. 11/16/19 0557 11/16/19 3825      Discharge Exam: Blood  pressure (!) 120/49, pulse 98, temperature 98.5 F (36.9 C), temperature source Oral, resp. rate 18, height 5\' 8"  (1.727 m), weight 93.2 kg, SpO2 92 %. Neurologic: Grossly normal Incision CDI  Discharge Medications:   Allergies as of 11/17/2019      Reactions   Codeine Nausea And Vomiting   Iodine Other (See Comments)   Redness around lips   Latex Rash   Lipitor [atorvastatin] Rash   Shellfish Allergy Swelling, Rash   Sulfa Antibiotics Rash      Medication List    TAKE these medications   acetaminophen 500 MG tablet Commonly known as: TYLENOL Take 500 mg by mouth every 6 (six) hours as needed (for pain.).   amLODipine 5 MG tablet Commonly known as: NORVASC TAKE 1 TABLET BY MOUTH  DAILY What changed: when to take this   gabapentin 300 MG capsule Commonly known as: NEURONTIN Take 300 mg by mouth in the morning and at bedtime.   meloxicam 15 MG tablet Commonly known as: MOBIC Take 15 mg by mouth at bedtime.   methocarbamol 500 MG tablet Commonly known as: ROBAXIN Take 1 tablet (500 mg total) by mouth every 6 (six) hours as needed for muscle spasms.   oxyCODONE 5 MG immediate release tablet Commonly known as: Oxy IR/ROXICODONE Take 1 tablet (5 mg total) by mouth every 4 (four) hours as needed for moderate pain ((score 4 to 6)).   rosuvastatin 10 MG tablet Commonly known as: CRESTOR TAKE 1 TABLET BY MOUTH  DAILY What changed: when to take this   venlafaxine XR 150 MG 24 hr capsule Commonly known as: EFFEXOR-XR TAKE 1 CAPSULE BY MOUTH  DAILY WITH BREAKFAST. Take with 37.5 mg capsule. What changed:   how much to take  how to take this  when to take this   venlafaxine XR 37.5 MG 24 hr capsule Commonly known as: EFFEXOR-XR TAKE 1 CAPSULE BY MOUTH  DAILY WITH BREAKFAST. TAKE  WITH 150 MG CAPSULE. What changed:   how much to take  how to take this  when to take this            Durable Medical Equipment  (From admission, onward)         Start      Ordered   11/16/19 1254  DME Walker rolling  Once    Question:  Patient needs a walker to treat with the following condition  Answer:  S/P lumbar fusion   11/16/19 1253   11/16/19 1254  DME 3 n 1  Once     11/16/19 1253          Disposition: home   Final Dx: AL:IF L5-S1  Discharge Instructions    Call MD for:  difficulty breathing, headache or visual disturbances   Complete by: As directed    Call MD for:  persistant nausea and vomiting   Complete by: As directed    Call MD for:  redness, tenderness, or signs of infection (pain, swelling, redness, odor or green/yellow discharge around incision site)   Complete by: As directed    Call MD for:  severe uncontrolled pain   Complete by: As directed    Call MD for:  temperature >100.4   Complete by: As directed    Diet - low sodium heart healthy   Complete by: As directed    Increase activity slowly   Complete by: As directed       Follow-up Information    Eustace Moore, MD. Schedule an appointment as soon as possible for a visit in 2 week(s).   Specialty: Neurosurgery Contact information: [address] [place] [address] [phone]            Signed: Eustace Moore 11/17/2019, 8:21 AM

## 2019-11-17 NOTE — Progress Notes (Signed)
  Progress Note    11/17/2019 7:17 AM 1 Day Post-Op  Subjective:  Mild surgical site discomfort otherwise states she feels good. Ambulated in hallway twice. Able to pass flatus and tolerating diet   Vitals:   11/16/19 2301 11/17/19 0428  BP: (!) 107/51 (!) 106/55  Pulse: 98 93  Resp: 18 20  Temp: 97.9 F (36.6 C) 98.6 F (37 C)  SpO2: 92% 92%   Physical Exam: General: well appearing, well nourished, not in any distress Cardiac:  regular Lungs:  Non labored Incisions: lower transverse abdominal incision clean, dry and intact. Minimal tenderness. No hematoma Extremities: Moving all extremities. Extremities well perfused. 2+ radial pulses bilaterally. 2+ palpable DP/ PT pulses bilaterally. Feet warm Abdomen:  Soft, non distended. Incision is clean dry and intact Neurologic: alert and oriented  CBC    Component Value Date/Time   WBC 5.7 11/12/2019 1011   RBC 4.91 11/12/2019 1011   HGB 15.1 (H) 11/12/2019 1011   HCT 45.4 11/12/2019 1011   PLT 292 11/12/2019 1011   MCV 92.5 11/12/2019 1011   MCH 30.8 11/12/2019 1011   MCHC 33.3 11/12/2019 1011   RDW 13.4 11/12/2019 1011   LYMPHSABS 1.9 11/12/2019 1011   MONOABS 0.5 11/12/2019 1011   EOSABS 0.0 11/12/2019 1011   BASOSABS 0.0 11/12/2019 1011    BMET    Component Value Date/Time   NA 140 11/12/2019 1011   NA 140 03/29/2016 1202   K 3.7 11/12/2019 1011   CL 107 11/12/2019 1011   CO2 22 11/12/2019 1011   GLUCOSE 120 (H) 11/12/2019 1011   BUN 12 11/12/2019 1011   BUN 13 03/29/2016 1202   CREATININE 0.66 11/12/2019 1011   CALCIUM 9.5 11/12/2019 1011   GFRNONAA >60 11/12/2019 1011   GFRAA >60 11/12/2019 1011    INR    Component Value Date/Time   INR 1.0 11/12/2019 1011     Intake/Output Summary (Last 24 hours) at 11/17/2019 0717 Last data filed at 11/16/2019 2108 Gross per 24 hour  Intake 2123 ml  Output 400 ml  Net 1723 ml     Assessment/Plan:  59 y.o. female is s/p Anterior spine exposure at L5-S1  disc space via left retroperitoneal approach 1 Day Post-Op. Doing well post op. Incision is clean, dry and intact. Extremities well perfused and warm with palpable pulses bilaterally. She has ambulated in the hallway without difficulty. PT to see and evaluate today. Tolerating diet. Moving bowels without difficulty. She can follow up with vascular surgery as needed if she has any issues or concerns   Graceann Congress, PA-C [facility] [phone] 11/17/2019 7:17 AM

## 2019-11-18 ENCOUNTER — Encounter: Payer: Self-pay | Admitting: *Deleted

## 2019-11-19 MED FILL — Heparin Sodium (Porcine) Inj 1000 Unit/ML: INTRAMUSCULAR | Qty: 30 | Status: AC

## 2019-11-19 MED FILL — Sodium Chloride IV Soln 0.9%: INTRAVENOUS | Qty: 1000 | Status: AC

## 2019-11-29 ENCOUNTER — Other Ambulatory Visit: Payer: Self-pay | Admitting: Primary Care

## 2019-11-29 DIAGNOSIS — I10 Essential (primary) hypertension: Secondary | ICD-10-CM

## 2019-11-29 DIAGNOSIS — E785 Hyperlipidemia, unspecified: Secondary | ICD-10-CM

## 2020-01-05 DIAGNOSIS — M5416 Radiculopathy, lumbar region: Secondary | ICD-10-CM | POA: Diagnosis not present

## 2020-03-07 ENCOUNTER — Emergency Department: Payer: BLUE CROSS/BLUE SHIELD

## 2020-03-07 ENCOUNTER — Other Ambulatory Visit: Payer: Self-pay

## 2020-03-07 ENCOUNTER — Encounter: Payer: Self-pay | Admitting: Emergency Medicine

## 2020-03-07 DIAGNOSIS — N12 Tubulo-interstitial nephritis, not specified as acute or chronic: Secondary | ICD-10-CM | POA: Diagnosis not present

## 2020-03-07 DIAGNOSIS — K429 Umbilical hernia without obstruction or gangrene: Secondary | ICD-10-CM | POA: Diagnosis not present

## 2020-03-07 DIAGNOSIS — Z87442 Personal history of urinary calculi: Secondary | ICD-10-CM | POA: Diagnosis not present

## 2020-03-07 DIAGNOSIS — I700 Atherosclerosis of aorta: Secondary | ICD-10-CM | POA: Diagnosis not present

## 2020-03-07 DIAGNOSIS — N10 Acute tubulo-interstitial nephritis: Secondary | ICD-10-CM | POA: Diagnosis not present

## 2020-03-07 DIAGNOSIS — I10 Essential (primary) hypertension: Secondary | ICD-10-CM | POA: Diagnosis not present

## 2020-03-07 DIAGNOSIS — Z79899 Other long term (current) drug therapy: Secondary | ICD-10-CM | POA: Insufficient documentation

## 2020-03-07 DIAGNOSIS — K5730 Diverticulosis of large intestine without perforation or abscess without bleeding: Secondary | ICD-10-CM | POA: Diagnosis not present

## 2020-03-07 DIAGNOSIS — Z91040 Latex allergy status: Secondary | ICD-10-CM | POA: Insufficient documentation

## 2020-03-07 LAB — CBC
HCT: 42.1 % (ref 36.0–46.0)
Hemoglobin: 14.5 g/dL (ref 12.0–15.0)
MCH: 30.4 pg (ref 26.0–34.0)
MCHC: 34.4 g/dL (ref 30.0–36.0)
MCV: 88.3 fL (ref 80.0–100.0)
Platelets: 299 10*3/uL (ref 150–400)
RBC: 4.77 MIL/uL (ref 3.87–5.11)
RDW: 13.1 % (ref 11.5–15.5)
WBC: 7.9 10*3/uL (ref 4.0–10.5)
nRBC: 0 % (ref 0.0–0.2)

## 2020-03-07 LAB — BASIC METABOLIC PANEL
Anion gap: 11 (ref 5–15)
BUN: 17 mg/dL (ref 6–20)
CO2: 26 mmol/L (ref 22–32)
Calcium: 9.2 mg/dL (ref 8.9–10.3)
Chloride: 101 mmol/L (ref 98–111)
Creatinine, Ser: 0.81 mg/dL (ref 0.44–1.00)
GFR calc Af Amer: >60 mL/min (ref >60)
GFR calc non Af Amer: >60 mL/min (ref >60)
Glucose, Bld: 106 mg/dL — ABNORMAL HIGH (ref 70–99)
Potassium: 3.6 mmol/L (ref 3.5–5.1)
Sodium: 138 mmol/L (ref 135–145)

## 2020-03-07 LAB — URINALYSIS, COMPLETE (UACMP) WITH MICROSCOPIC
Bacteria, UA: NONE SEEN
Bilirubin Urine: NEGATIVE
Glucose, UA: NEGATIVE mg/dL
Ketones, ur: NEGATIVE mg/dL
Nitrite: NEGATIVE
Protein, ur: NEGATIVE mg/dL
Specific Gravity, Urine: 1.013 (ref 1.005–1.030)
pH: 5 (ref 5.0–8.0)

## 2020-03-07 MED ORDER — ONDANSETRON HCL 4 MG/2ML IJ SOLN
4.0000 mg | Freq: Once | INTRAMUSCULAR | Status: AC
  Administered 2020-03-07: 4 mg via INTRAVENOUS
  Filled 2020-03-07: qty 2

## 2020-03-07 MED ORDER — FENTANYL CITRATE (PF) 100 MCG/2ML IJ SOLN
50.0000 ug | INTRAMUSCULAR | Status: AC | PRN
  Administered 2020-03-07 (×2): 50 ug via INTRAVENOUS
  Filled 2020-03-07: qty 2

## 2020-03-07 NOTE — ED Triage Notes (Signed)
Left flank pain x 2 hours.  Patient states pain is similar to when she had a kidney stone .

## 2020-03-08 ENCOUNTER — Emergency Department
Admission: EM | Admit: 2020-03-08 | Discharge: 2020-03-08 | Disposition: A | Discharge status: Home / Self Care | Payer: BLUE CROSS/BLUE SHIELD | Attending: Emergency Medicine | Admitting: Emergency Medicine

## 2020-03-08 DIAGNOSIS — R109 Unspecified abdominal pain: Secondary | ICD-10-CM

## 2020-03-08 DIAGNOSIS — N12 Tubulo-interstitial nephritis, not specified as acute or chronic: Secondary | ICD-10-CM

## 2020-03-08 HISTORY — DX: Disorder of kidney and ureter, unspecified: N28.9

## 2020-03-08 LAB — LACTIC ACID, PLASMA: Lactic Acid, Venous: 1 mmol/L (ref 0.5–1.9)

## 2020-03-08 MED ORDER — MORPHINE SULFATE (PF) 4 MG/ML IV SOLN
4.0000 mg | Freq: Once | INTRAVENOUS | Status: AC
  Administered 2020-03-08: 4 mg via INTRAVENOUS
  Filled 2020-03-08: qty 1

## 2020-03-08 MED ORDER — CEPHALEXIN 500 MG PO CAPS: 500.0000 mg | ORAL_CAPSULE | Freq: Three times a day (TID) | ORAL | 0 refills | Status: DC

## 2020-03-08 MED ORDER — ONDANSETRON 4 MG PO TBDP: 4.0000 mg | ORAL_TABLET | Freq: Three times a day (TID) | ORAL | 0 refills | Status: DC | PRN

## 2020-03-08 MED ORDER — SODIUM CHLORIDE 0.9 % IV SOLN
1.0000 g | Freq: Once | INTRAVENOUS | Status: AC
  Administered 2020-03-08: 1 g via INTRAVENOUS
  Filled 2020-03-08: qty 10

## 2020-03-08 MED ORDER — ONDANSETRON HCL 4 MG/2ML IJ SOLN
4.0000 mg | Freq: Once | INTRAMUSCULAR | Status: AC
  Administered 2020-03-08: 4 mg via INTRAVENOUS
  Filled 2020-03-08: qty 2

## 2020-03-08 MED ORDER — OXYCODONE-ACETAMINOPHEN 5-325 MG PO TABS
1.0000 | ORAL_TABLET | Freq: Once | ORAL | Status: AC
  Administered 2020-03-08: 1 via ORAL
  Filled 2020-03-08: qty 1

## 2020-03-08 MED ORDER — OXYCODONE-ACETAMINOPHEN 5-325 MG PO TABS: 1.0000 | ORAL_TABLET | ORAL | 0 refills | Status: DC | PRN

## 2020-03-08 MED ORDER — KETOROLAC TROMETHAMINE 30 MG/ML IJ SOLN
10.0000 mg | Freq: Once | INTRAMUSCULAR | Status: AC
  Administered 2020-03-08: 9.9 mg via INTRAVENOUS
  Filled 2020-03-08: qty 1

## 2020-03-08 MED ORDER — SODIUM CHLORIDE 0.9 % IV BOLUS
1000.0000 mL | Freq: Once | INTRAVENOUS | Status: AC
  Administered 2020-03-08: 1000 mL via INTRAVENOUS

## 2020-03-08 NOTE — ED Provider Notes (Signed)
[facility] Emergency Department Provider Note   ____________________________________________   First MD Initiated Contact with Patient 03/08/20 0109     (approximate)  I have reviewed the triage vital signs and the nursing notes.   HISTORY  Chief Complaint Flank Pain    HPI Elaine Young is a 59 y.o. female who presents to the ED from home with a chief complaint of left flank pain.  Patient reports left flank pain onset approximately 3:30PM.  History of kidney stones and states this feels similarly.  Associated with nausea and dysuria.  Denies fever, chest pain, shortness of breath, vomiting, diarrhea.  Denies recent travel or trauma.      Past Medical History:  Diagnosis Date  . Carpal tunnel syndrome   . Cervical disc disorder   . Complication of anesthesia   . Essential hypertension   . Functional incontinence   . Generalized anxiety disorder   . Hyperlipidemia   . Lower extremity numbness   . Lumbar disc disease   . Migraines   . PONV (postoperative nausea and vomiting)    per pt, she usually gets zofran and scopalamine patch   . Renal disorder    kidney stone  . Seasonal allergies   . Vitamin D deficiency     Patient Active Problem List   Diagnosis Date Noted  . S/P lumbar fusion 11/16/2019  . Palpitations 12/17/2018  . Chronic lower back pain 06/02/2018  . Family history of brain aneurysm 04/03/2016  . Preventative health care 04/03/2016  . Prediabetes 04/03/2016  . Essential hypertension 08/09/2015  . Hyperlipidemia 08/09/2015  . Anxiety and depression 08/09/2015  . Lower extremity numbness 08/09/2015    Past Surgical History:  Procedure Laterality Date  . ABDOMINAL EXPOSURE N/A 11/16/2019   Procedure: ABDOMINAL EXPOSURE;  Surgeon: Cephus Shelling, MD;  Location: [facility] OR;  Service: Vascular;  Laterality: N/A;  . ABDOMINAL HYSTERECTOMY    . ANTERIOR LUMBAR FUSION N/A 11/16/2019   Procedure: LUMBAR FIVE-SACRAL ONE  ANTERIOR LUMBAR INTERBODY FUSION;  Surgeon: Tia Alert, MD;  Location: [facility] OR;  Service: Neurosurgery;  Laterality: N/A;  anterior approach  . BLADDER SUSPENSION  2005  . BREAST BIOPSY Bilateral yrs ago in [place]   high risk lesions, not cancer per pt  . BREAST CYST EXCISION Bilateral yrs ago in [place]   hard to see some excision areas  . CHOLECYSTECTOMY    . TONSILLECTOMY      Prior to Admission medications   Medication Sig Start Date End Date Taking? Authorizing Provider  acetaminophen (TYLENOL) 500 MG tablet Take 500 mg by mouth every 6 (six) hours as needed (for pain.).    [provider]  amLODipine (NORVASC) 5 MG tablet TAKE 1 TABLET BY MOUTH  DAILY 12/02/19   Doreene Nest, NP  cephALEXin (KEFLEX) 500 MG capsule Take 1 capsule (500 mg total) by mouth 3 (three) times daily. 03/08/20   Irean Hong, MD  gabapentin (NEURONTIN) 300 MG capsule Take 300 mg by mouth in the morning and at bedtime.  10/13/19   [provider]  meloxicam (MOBIC) 15 MG tablet Take 15 mg by mouth at bedtime.  10/13/19   [provider]  methocarbamol (ROBAXIN) 500 MG tablet Take 1 tablet (500 mg total) by mouth every 6 (six) hours as needed for muscle spasms. 11/17/19   Tia Alert, MD  ondansetron (ZOFRAN ODT) 4 MG disintegrating tablet Take 1 tablet (4 mg total) by mouth every 8 (  eight) hours as needed for nausea or vomiting. 03/08/20   Irean HongSung, Rilyn Upshaw J, MD  oxyCODONE (OXY IR/ROXICODONE) 5 MG immediate release tablet Take 1 tablet (5 mg total) by mouth every 4 (four) hours as needed for moderate pain ((score 4 to 6)). 11/17/19   Tia AlertJones, David S, MD  oxyCODONE-acetaminophen (PERCOCET/ROXICET) 5-325 MG tablet Take 1 tablet by mouth every 4 (four) hours as needed for severe pain. 03/08/20   Irean HongSung, Taysia Rivere J, MD  rosuvastatin (CRESTOR) 10 MG tablet TAKE 1 TABLET BY MOUTH  DAILY 12/02/19   Doreene Nestlark, Katherine K, NP  venlafaxine XR (EFFEXOR-XR) 150 MG 24 hr capsule TAKE 1 CAPSULE BY MOUTH  DAILY WITH BREAKFAST. Take  with 37.5 mg capsule. Patient taking differently: Take 150 mg by mouth at bedtime. TAKE 1 CAPSULE BY MOUTH  DAILY WITH BREAKFAST. Take with 37.5 mg capsule. 06/05/19   Doreene Nestlark, Katherine K, NP  venlafaxine XR (EFFEXOR-XR) 37.5 MG 24 hr capsule TAKE 1 CAPSULE BY MOUTH  DAILY WITH BREAKFAST. TAKE  WITH 150 MG CAPSULE. Patient taking differently: Take 37.5 mg by mouth at bedtime. TAKE 1 CAPSULE BY MOUTH  DAILY WITH BREAKFAST. TAKE  WITH 150 MG CAPSULE. 06/05/19   Doreene Nestlark, Katherine K, NP    Allergies Codeine, Iodine, Latex, Lipitor [atorvastatin], Shellfish allergy, and Sulfa antibiotics  Family History  Problem Relation Age of Onset  . Hyperlipidemia Mother   . Breast cancer Mother [id number]  . Ovarian cancer Maternal Aunt   . Lung cancer Maternal Uncle   . Arthritis Maternal Grandmother   . Arthritis Maternal Grandfather   . Colon cancer Maternal Grandfather   . Arthritis Paternal Grandmother   . Hypertension Paternal Grandmother   . Diabetes Paternal Grandmother   . Arthritis Paternal Grandfather   . Diabetes Paternal Grandfather   . Arthritis Cousin   . Breast cancer Cousin        maternal side x2    Social History Social History   Tobacco Use  . Smoking status: Never Smoker  . Smokeless tobacco: Never Used  Vaping Use  . Vaping Use: Never used  Substance Use Topics  . Alcohol use: Yes    Alcohol/week: 0.0 standard drinks  . Drug use: No    Review of Systems  Constitutional: No fever/chills Eyes: No visual changes. ENT: No sore throat. Cardiovascular: Denies chest pain. Respiratory: Denies shortness of breath. Gastrointestinal: Positive for left flank pain.  No abdominal pain.  Positive for nausea, no vomiting.  No diarrhea.  No constipation. Genitourinary: Positive for dysuria. Musculoskeletal: Negative for back pain. Skin: Negative for rash. Neurological: Negative for headaches, focal weakness or numbness.   ____________________________________________   PHYSICAL  EXAM:  VITAL SIGNS: ED Triage Vitals  Enc Vitals Group     BP 03/07/20 1723 (!) 119/52     Pulse Rate 03/07/20 1723 92     Resp 03/07/20 1723 16     Temp 03/07/20 1723 98.5 F (36.9 C)     Temp Source 03/07/20 1723 Oral     SpO2 03/07/20 1723 99 %     Weight 03/07/20 1721 205 lb 7.5 oz (93.2 kg)     Height 03/07/20 1721 5\' 8"  (1.727 m)     Head Circumference --      Peak Flow --      Pain Score 03/07/20 1721 10     Pain Loc --      Pain Edu? --      Excl. in GC? --  Constitutional: Alert and oriented. Well appearing and in no acute distress. Eyes: Conjunctivae are normal. PERRL. EOMI. Head: Atraumatic. Nose: No congestion/rhinnorhea. Mouth/Throat: Mucous membranes are moist.   Neck: No stridor.   Cardiovascular: Normal rate, regular rhythm. Grossly normal heart sounds.  Good peripheral circulation. Respiratory: Normal respiratory effort.  No retractions. Lungs CTAB. Gastrointestinal: Soft and nontender to D palpation. No distention. No abdominal bruits.  Mild left CVA tenderness. Musculoskeletal: No lower extremity tenderness nor edema.  No joint effusions. Neurologic:  Normal speech and language. No gross focal neurologic deficits are appreciated. No gait instability. Skin:  Skin is warm, dry and intact. No rash noted.  No vesicles. Psychiatric: Mood and affect are normal. Speech and behavior are normal.  ____________________________________________   LABS (all labs ordered are listed, but only abnormal results are displayed)  Labs Reviewed  URINALYSIS, COMPLETE (UACMP) WITH MICROSCOPIC - Abnormal; Notable for the following components:      Result Value   Color, Urine YELLOW (*)    APPearance CLOUDY (*)    Hgb urine dipstick SMALL (*)    Leukocytes,Ua MODERATE (*)    All other components within normal limits  BASIC METABOLIC PANEL - Abnormal; Notable for the following components:   Glucose, Bld 106 (*)    All other components within normal limits  URINE CULTURE   CBC  LACTIC ACID, PLASMA  POC URINE PREG, ED   ____________________________________________  EKG  None ____________________________________________  RADIOLOGY  ED MD interpretation: No obstructive uropathy  Official radiology report(s): CT Renal Stone Study  Result Date: 03/07/2020 CLINICAL DATA:  Left flank pain for 2 hours. History of kidney stone. EXAM: CT ABDOMEN AND PELVIS WITHOUT CONTRAST TECHNIQUE: Multidetector CT imaging of the abdomen and pelvis was performed following the standard protocol without IV contrast. COMPARISON:  Most recent CT 04/03/2017 FINDINGS: Lower chest: Subpleural opacity at the right lung base appears similar to 2018 exam and may be related to scarring. There is no acute basilar opacity. No pleural effusion. The heart is normal in size. Hepatobiliary: Borderline hepatic steatosis. No focal hepatic abnormality. Clips in the gallbladder fossa postcholecystectomy. No biliary dilatation. Pancreas: No ductal dilatation or inflammation. Spleen: Normal in size without focal abnormality. Adrenals/Urinary Tract: Normal adrenal glands. No hydronephrosis. Trace symmetric perinephric edema. No renal calculi. Both ureters are decompressed without stones along the course. Low-density lesion in the right kidney is likely cysts but incompletely characterized without contrast. Urinary bladder is partially distended. Stomach/Bowel: Multifocal colonic diverticulosis most prominent in the descending and sigmoid colon but no evidence of diverticulitis. No colonic wall thickening or pericolonic edema. Normal appendix. No small bowel obstruction or inflammation. Occasional fecalization of distal small bowel contents. Small duodenal diverticulum without inflammation. Ingested material in the stomach. No gastric wall thickening. Vascular/Lymphatic: Mild aortic atherosclerosis. No aortic aneurysm. Multiple small retroperitoneal lymph nodes not enlarged by size criteria. Stable prominent  periportal nodes. No bulky abdominopelvic adenopathy. Reproductive: Hysterectomy. Ovaries are visualized and quiescent. No adnexal mass. Other: No free air, free fluid, or intra-abdominal fluid collection. Small fat containing umbilical hernia. Musculoskeletal: Anterior fusion at L5-S1. There are no acute or suspicious osseous abnormalities. IMPRESSION: 1. No renal stones or obstructive uropathy. No acute findings in the abdomen/pelvis. 2. Colonic diverticulosis without acute inflammation. Aortic Atherosclerosis (ICD10-I70.0). Electronically Signed   By: Narda Rutherford M.D.   On: 03/07/2020 18:31    ____________________________________________   PROCEDURES  Procedure(s) performed (including Critical Care):  Procedures   ____________________________________________   INITIAL IMPRESSION / ASSESSMENT AND  PLAN / ED COURSE  As part of my medical decision making, I reviewed the following data within the electronic MEDICAL RECORD NUMBER Nursing notes reviewed and incorporated, Labs reviewed, Old chart reviewed, Radiograph reviewed, Notes from prior ED visits and  Controlled Substance Database     Elaine Young was evaluated in Emergency Department on 03/08/2020 for the symptoms described in the history of present illness. She was evaluated in the context of the global COVID-19 pandemic, which necessitated consideration that the patient might be at risk for infection with the SARS-CoV-2 virus that causes COVID-19. Institutional protocols and algorithms that pertain to the evaluation of patients at risk for COVID-19 are in a state of rapid change based on information released by regulatory bodies including the CDC and federal and state organizations. These policies and algorithms were followed during the patient's care in the ED.    59 year old female presenting with left flank pain. Differential diagnosis includes, but is not limited to, ovarian cyst, ovarian torsion, acute appendicitis,  diverticulitis, urinary tract infection/pyelonephritis, endometriosis, bowel obstruction, colitis, renal colic, gastroenteritis, hernia, fibroids, etc.  Laboratory results unremarkable; UA demonstrates moderate leukocytes with 11-20 WBC.  CT unremarkable.  Will check lactic acid.  Initiate IV hydration, IV antibiotic, analgesia and reassess.   Clinical Course as of Mar 08 428  Tue Mar 08, 2020  0973 Updated patient on negative lactic acid results.  Will discharge home on Keflex, Percocet and Zofran to use as needed.  Strict return precautions given.  Patient verbalizes understanding agrees with plan of care.   [JS]  [id number] Patient had transient nausea upon discharge.  She rested for a few minutes, had sips of ginger ale and graham crackers, felt significantly better "like a changed woman" and was discharged in good and stable condition.   [JS]    Clinical Course User Index [JS] Irean Hong, MD     ____________________________________________   FINAL CLINICAL IMPRESSION(S) / ED DIAGNOSES  Final diagnoses:  Left flank pain  Pyelonephritis     ED Discharge Orders         Ordered    cephALEXin (KEFLEX) 500 MG capsule  3 times daily        03/08/20 0340    oxyCODONE-acetaminophen (PERCOCET/ROXICET) 5-325 MG tablet  Every 4 hours PRN        03/08/20 0340    ondansetron (ZOFRAN ODT) 4 MG disintegrating tablet  Every 8 hours PRN        03/08/20 0340           Note:  This document was prepared using Dragon voice recognition software and may include unintentional dictation errors.   Irean Hong, MD 03/08/20 0430

## 2020-03-08 NOTE — Discharge Instructions (Signed)
1.  Take antibiotic as prescribed (Keflex 500 mg 3 times daily x7 days). 2.  You may take Ibuprofen as needed for pain; Percocet as needed for more severe pain. 3.  You may take Zofran as needed for nausea. 4.  Drink plenty of fluids daily. 5.  Return to the ER for worsening symptoms, persistent vomiting, difficulty breathing or other concerns.

## 2020-03-09 LAB — URINE CULTURE

## 2020-04-07 DIAGNOSIS — Z6830 Body mass index (BMI) 30.0-30.9, adult: Secondary | ICD-10-CM | POA: Diagnosis not present

## 2020-04-07 DIAGNOSIS — M5459 Other low back pain: Secondary | ICD-10-CM | POA: Diagnosis not present

## 2020-04-07 DIAGNOSIS — M5416 Radiculopathy, lumbar region: Secondary | ICD-10-CM | POA: Diagnosis not present

## 2020-05-17 ENCOUNTER — Other Ambulatory Visit: Payer: Self-pay | Admitting: Primary Care

## 2020-05-17 DIAGNOSIS — I10 Essential (primary) hypertension: Secondary | ICD-10-CM

## 2020-05-17 DIAGNOSIS — E785 Hyperlipidemia, unspecified: Secondary | ICD-10-CM

## 2020-05-24 ENCOUNTER — Other Ambulatory Visit: Payer: Self-pay | Admitting: Primary Care

## 2020-05-24 DIAGNOSIS — F32A Depression, unspecified: Secondary | ICD-10-CM

## 2020-05-24 DIAGNOSIS — F411 Generalized anxiety disorder: Secondary | ICD-10-CM

## 2020-05-24 DIAGNOSIS — F419 Anxiety disorder, unspecified: Secondary | ICD-10-CM

## 2020-06-23 ENCOUNTER — Telehealth: Payer: Self-pay

## 2020-06-23 NOTE — Telephone Encounter (Signed)
Called patient l/m to call office has not been seen in over a year. Needs physical scheduled before we can send in refill.

## 2020-06-28 NOTE — Telephone Encounter (Signed)
Please contact pt to schedule physical before anymore refills will be sent.  Thank you,  Christy Gentles

## 2020-07-07 DIAGNOSIS — Z20822 Contact with and (suspected) exposure to covid-19: Secondary | ICD-10-CM | POA: Diagnosis not present

## 2020-07-19 ENCOUNTER — Telehealth: Payer: Self-pay | Admitting: *Deleted

## 2020-07-19 NOTE — Telephone Encounter (Signed)
Pt called in to cancelled her CPE due to multiple people being out with covid at her job she has to work. There are no CPE spots available anytime soon and pt asked if PCP would work her in one of her open spots on 07/28/20, she said that she is off that day. I advise pt PCP hit max # of CPEs for that day so I would have to send phone note to her for approval.   Will route to PCP and CMA

## 2020-07-19 NOTE — Telephone Encounter (Signed)
Okay to work her in whenever is convenient for her, I'm flexible.

## 2020-07-20 NOTE — Telephone Encounter (Signed)
I can not overbook session limits do you mind calling to get her in on the day she has requested?

## 2020-07-22 ENCOUNTER — Encounter: Payer: BLUE CROSS/BLUE SHIELD | Admitting: Primary Care

## 2020-07-26 NOTE — Telephone Encounter (Signed)
Called patient to schedule CPE. LVM to call back as per DPR.

## 2020-07-27 NOTE — Telephone Encounter (Signed)
Viewed schedule and patient scheduled 08/30/20.

## 2020-08-01 DIAGNOSIS — Z03818 Encounter for observation for suspected exposure to other biological agents ruled out: Secondary | ICD-10-CM | POA: Diagnosis not present

## 2020-08-01 DIAGNOSIS — Z20822 Contact with and (suspected) exposure to covid-19: Secondary | ICD-10-CM | POA: Diagnosis not present

## 2020-08-03 ENCOUNTER — Other Ambulatory Visit: Payer: BLUE CROSS/BLUE SHIELD

## 2020-08-03 ENCOUNTER — Telehealth (INDEPENDENT_AMBULATORY_CARE_PROVIDER_SITE_OTHER): Payer: BLUE CROSS/BLUE SHIELD | Admitting: Primary Care

## 2020-08-03 ENCOUNTER — Other Ambulatory Visit: Payer: Self-pay

## 2020-08-03 VITALS — Temp 98.1°F | Ht 68.0 in | Wt 200.0 lb

## 2020-08-03 DIAGNOSIS — R059 Cough, unspecified: Secondary | ICD-10-CM | POA: Insufficient documentation

## 2020-08-03 NOTE — Patient Instructions (Signed)
We will notify you regarding your Covid-19 testing.  Continue Dayquil or Robitussin. Okay to take Tylenol as needed.  It was a pleasure to see you today! Mayra Reel, NP-C

## 2020-08-03 NOTE — Assessment & Plan Note (Signed)
Three days of acute viral appearing illness, suspicious for Covid-19. Fortunately she has been vaccinated. Will repeat testing as she likely tested too soon. Discussed conservative home care treatment. Await results.

## 2020-08-03 NOTE — Progress Notes (Signed)
Subjective:    Patient ID: Elaine Young, female    DOB: 08/09/1960, 60 y.o.   MRN: [id number]  HPI  Virtual Visit via Video Note  I connected with [name] on 08/03/20 at  8:20 AM EST by a video enabled telemedicine application and verified that I am speaking with the correct person using two identifiers.  Location: Patient: Home Provider: Office Participants: Patient and myself   I discussed the limitations of evaluation and management by telemedicine and the availability of in person appointments. The patient expressed understanding and agreed to proceed.  History of Present Illness:  Elaine Young is a 60 year old female with a history of hypertension, hyperlipidemia, prediabetes, palpitations who presents today with a chief complaint of cough.  She also reports watery eyes, chest pressure, headache, body aches, fatigue. Symptoms began two days ago. She's has three Covid-19 vaccines. She was tested for Covid-19 two days ago when symptoms began, had a negative result.  She's been taking Dayqill/Nyquil. She's sleeping a lot more. Symptoms are progressing. She has missed two days of work, is needed a note.   Observations/Objective:  Alert and oriented. Appears well, appears ill No distress. Cough noted once. Speaking in complete sentences.   Assessment and Plan:  Three days of acute viral appearing illness, suspicious for Covid-19. Fortunately she has been vaccinated. Will repeat testing as she likely tested too soon. Discussed conservative home care treatment. Await results.   Follow Up Instructions:  We will notify you regarding your Covid-19 testing.  Continue Dayquil or Robitussin. Okay to take Tylenol as needed.  It was a pleasure to see you today! Mayra Reel, NP-C    I discussed the assessment and treatment plan with the patient. The patient was provided an opportunity to ask questions and all were answered. The patient agreed with the  plan and demonstrated an understanding of the instructions.   The patient was advised to call back or seek an in-person evaluation if the symptoms worsen or if the condition fails to improve as anticipated.    Doreene Nest, NP    Review of Systems  Constitutional: Positive for fatigue. Negative for chills and fever.  HENT: Positive for congestion.   Respiratory: Positive for cough and chest tightness.   Neurological: Positive for headaches.       Past Medical History:  Diagnosis Date  . Carpal tunnel syndrome   . Cervical disc disorder   . Complication of anesthesia   . Essential hypertension   . Functional incontinence   . Generalized anxiety disorder   . Hyperlipidemia   . Lower extremity numbness   . Lumbar disc disease   . Migraines   . PONV (postoperative nausea and vomiting)    per pt, she usually gets zofran and scopalamine patch   . Renal disorder    kidney stone  . Seasonal allergies   . Vitamin D deficiency      Social History   Socioeconomic History  . Marital status: Married    Spouse name: Not on file  . Number of children: Not on file  . Years of education: Not on file  . Highest education level: Not on file  Occupational History  . Not on file  Tobacco Use  . Smoking status: Never Smoker  . Smokeless tobacco: Never Used  Vaping Use  . Vaping Use: Never used  Substance and Sexual Activity  . Alcohol use: Yes    Alcohol/week: 0.0 standard drinks  .  Drug use: No  . Sexual activity: Not on file  Other Topics Concern  . Not on file  Social History Narrative   Married.   1 child. 1 grandchild.   Retired.    Enjoys reading.    Social Determinants of Health   Financial Resource Strain: Not on file  Food Insecurity: Not on file  Transportation Needs: Not on file  Physical Activity: Not on file  Stress: Not on file  Social Connections: Not on file  Intimate Partner Violence: Not on file    Past Surgical History:  Procedure  Laterality Date  . ABDOMINAL EXPOSURE N/A 11/16/2019   Procedure: ABDOMINAL EXPOSURE;  Surgeon: Cephus Shelling, MD;  Location: [facility] OR;  Service: Vascular;  Laterality: N/A;  . ABDOMINAL HYSTERECTOMY    . ANTERIOR LUMBAR FUSION N/A 11/16/2019   Procedure: LUMBAR FIVE-SACRAL ONE ANTERIOR LUMBAR INTERBODY FUSION;  Surgeon: Tia Alert, MD;  Location: [facility] OR;  Service: Neurosurgery;  Laterality: N/A;  anterior approach  . BLADDER SUSPENSION  2005  . BREAST BIOPSY Bilateral yrs ago in [place]   high risk lesions, not cancer per pt  . BREAST CYST EXCISION Bilateral yrs ago in [place]   hard to see some excision areas  . CHOLECYSTECTOMY    . TONSILLECTOMY      Family History  Problem Relation Age of Onset  . Hyperlipidemia Mother   . Breast cancer Mother 66  . Ovarian cancer Maternal Aunt   . Lung cancer Maternal Uncle   . Arthritis Maternal Grandmother   . Arthritis Maternal Grandfather   . Colon cancer Maternal Grandfather   . Arthritis Paternal Grandmother   . Hypertension Paternal Grandmother   . Diabetes Paternal Grandmother   . Arthritis Paternal Grandfather   . Diabetes Paternal Grandfather   . Arthritis Cousin   . Breast cancer Cousin        maternal side x2    Allergies  Allergen Reactions  . Codeine Nausea And Vomiting  . Iodine Other (See Comments)    Redness around lips  . Latex Rash  . Lipitor [Atorvastatin] Rash  . Shellfish Allergy Swelling and Rash  . Sulfa Antibiotics Rash    Current Outpatient Medications on File Prior to Visit  Medication Sig Dispense Refill  . acetaminophen (TYLENOL) 500 MG tablet Take 500 mg by mouth every 6 (six) hours as needed (for pain.).    Marland Kitchen amLODipine (NORVASC) 5 MG tablet TAKE 1 TABLET BY MOUTH  DAILY 90 tablet 0  . gabapentin (NEURONTIN) 300 MG capsule Take 300 mg by mouth in the morning and at bedtime.     . meloxicam (MOBIC) 15 MG tablet Take 15 mg by mouth at bedtime.     . methocarbamol (ROBAXIN) 500 MG tablet Take 1 tablet  (500 mg total) by mouth every 6 (six) hours as needed for muscle spasms. 60 tablet 1  . rosuvastatin (CRESTOR) 10 MG tablet TAKE 1 TABLET BY MOUTH  DAILY 90 tablet 0  . venlafaxine XR (EFFEXOR-XR) 150 MG 24 hr capsule Take 1 capsule (150 mg total) by mouth at bedtime. TAKE 1 CAPSULE BY MOUTH  DAILY WITH BREAKFAST. Take with 37.5 mg capsule. 90 capsule 0  . venlafaxine XR (EFFEXOR-XR) 37.5 MG 24 hr capsule Take 1 capsule (37.5 mg total) by mouth at bedtime. TAKE 1 CAPSULE BY MOUTH  DAILY WITH BREAKFAST. TAKE  WITH 150 MG CAPSULE. 90 capsule 0  . ondansetron (ZOFRAN ODT) 4 MG disintegrating tablet Take 1 tablet (4  mg total) by mouth every 8 (eight) hours as needed for nausea or vomiting. (Patient not taking: Reported on 08/03/2020) 20 tablet 0  . oxyCODONE (OXY IR/ROXICODONE) 5 MG immediate release tablet Take 1 tablet (5 mg total) by mouth every 4 (four) hours as needed for moderate pain ((score 4 to 6)). (Patient not taking: Reported on 08/03/2020) 40 tablet 0  . oxyCODONE-acetaminophen (PERCOCET/ROXICET) 5-325 MG tablet Take 1 tablet by mouth every 4 (four) hours as needed for severe pain. (Patient not taking: Reported on 08/03/2020) 20 tablet 0   No current facility-administered medications on file prior to visit.    Temp 98.1 F (36.7 C) (Tympanic)   Ht 5\' 8"  (1.727 m)   Wt 200 lb (90.7 kg)   BMI 30.41 kg/m    Objective:   Physical Exam Constitutional:      General: She is not in acute distress.    Appearance: She is ill-appearing.  Pulmonary:     Effort: Pulmonary effort is normal.     Comments: Cough noted once during visit Neurological:     Mental Status: She is alert and oriented to person, place, and time.  Psychiatric:        Mood and Affect: Mood normal.            Assessment & Plan:

## 2020-08-04 LAB — SARS-COV-2, NAA 2 DAY TAT

## 2020-08-04 LAB — NOVEL CORONAVIRUS, NAA: SARS-CoV-2, NAA: NOT DETECTED

## 2020-08-07 DIAGNOSIS — J3489 Other specified disorders of nose and nasal sinuses: Secondary | ICD-10-CM

## 2020-08-08 MED ORDER — AMOXICILLIN-POT CLAVULANATE 875-125 MG PO TABS: 1.0000 | ORAL_TABLET | Freq: Two times a day (BID) | ORAL | 0 refills | Status: DC

## 2020-08-10 ENCOUNTER — Other Ambulatory Visit: Payer: Self-pay | Admitting: Primary Care

## 2020-08-10 DIAGNOSIS — E785 Hyperlipidemia, unspecified: Secondary | ICD-10-CM

## 2020-08-10 DIAGNOSIS — I10 Essential (primary) hypertension: Secondary | ICD-10-CM
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
## 2020-08-30 ENCOUNTER — Encounter: Payer: Self-pay | Admitting: Primary Care

## 2020-08-30 ENCOUNTER — Other Ambulatory Visit: Payer: Self-pay

## 2020-08-30 ENCOUNTER — Ambulatory Visit (INDEPENDENT_AMBULATORY_CARE_PROVIDER_SITE_OTHER): Payer: BLUE CROSS/BLUE SHIELD | Admitting: Primary Care

## 2020-08-30 VITALS — BP 110/62 | HR 83 | Temp 98.6°F | Ht 68.0 in | Wt 202.0 lb

## 2020-08-30 DIAGNOSIS — F411 Generalized anxiety disorder: Secondary | ICD-10-CM

## 2020-08-30 DIAGNOSIS — I10 Essential (primary) hypertension: Secondary | ICD-10-CM

## 2020-08-30 DIAGNOSIS — Z0000 Encounter for general adult medical examination without abnormal findings: Secondary | ICD-10-CM | POA: Diagnosis not present

## 2020-08-30 DIAGNOSIS — Z1211 Encounter for screening for malignant neoplasm of colon: Secondary | ICD-10-CM

## 2020-08-30 DIAGNOSIS — E785 Hyperlipidemia, unspecified: Secondary | ICD-10-CM

## 2020-08-30 DIAGNOSIS — Z114 Encounter for screening for human immunodeficiency virus [HIV]: Secondary | ICD-10-CM

## 2020-08-30 DIAGNOSIS — F419 Anxiety disorder, unspecified: Secondary | ICD-10-CM

## 2020-08-30 DIAGNOSIS — G8929 Other chronic pain: Secondary | ICD-10-CM

## 2020-08-30 DIAGNOSIS — Z1231 Encounter for screening mammogram for malignant neoplasm of breast: Secondary | ICD-10-CM

## 2020-08-30 DIAGNOSIS — R7303 Prediabetes: Secondary | ICD-10-CM

## 2020-08-30 DIAGNOSIS — M545 Low back pain, unspecified: Secondary | ICD-10-CM

## 2020-08-30 DIAGNOSIS — F32A Depression, unspecified: Secondary | ICD-10-CM

## 2020-08-30 LAB — LIPID PANEL
Cholesterol: 188 mg/dL (ref 0–200)
HDL: 59.9 mg/dL (ref >39.00)
LDL Cholesterol: 101 mg/dL — ABNORMAL HIGH (ref 0–99)
NonHDL: 127.84
Total CHOL/HDL Ratio: 3
Triglycerides: 132 mg/dL (ref 0.0–149.0)
VLDL: 26.4 mg/dL (ref 0.0–40.0)

## 2020-08-30 LAB — COMPREHENSIVE METABOLIC PANEL
ALT: 57 U/L — ABNORMAL HIGH (ref 0–35)
AST: 28 U/L (ref 0–37)
Albumin: 4.5 g/dL (ref 3.5–5.2)
Alkaline Phosphatase: 90 U/L (ref 39–117)
BUN: 10 mg/dL (ref 6–23)
CO2: 29 mEq/L (ref 19–32)
Calcium: 9.5 mg/dL (ref 8.4–10.5)
Chloride: 104 mEq/L (ref 96–112)
Creatinine, Ser: 0.67 mg/dL (ref 0.40–1.20)
GFR: 94.89 mL/min (ref >60.00)
Glucose, Bld: 121 mg/dL — ABNORMAL HIGH (ref 70–99)
Potassium: 4.4 mEq/L (ref 3.5–5.1)
Sodium: 137 mEq/L (ref 135–145)
Total Bilirubin: 0.6 mg/dL (ref 0.2–1.2)
Total Protein: 7.1 g/dL (ref 6.0–8.3)

## 2020-08-30 LAB — CBC
HCT: 41.1 % (ref 36.0–46.0)
Hemoglobin: 14.1 g/dL (ref 12.0–15.0)
MCHC: 34.2 g/dL (ref 30.0–36.0)
MCV: 90.7 fl (ref 78.0–100.0)
Platelets: 273 10*3/uL (ref 150.0–400.0)
RBC: 4.53 Mil/uL (ref 3.87–5.11)
RDW: 13.9 % (ref 11.5–15.5)
WBC: 4.5 10*3/uL (ref 4.0–10.5)

## 2020-08-30 LAB — HEMOGLOBIN A1C: Hgb A1c MFr Bld: 6 % (ref 4.6–6.5)

## 2020-08-30 MED ORDER — ROSUVASTATIN CALCIUM 10 MG PO TABS: 10.0000 mg | ORAL_TABLET | Freq: Every day | ORAL | 3 refills | Status: DC

## 2020-08-30 MED ORDER — VENLAFAXINE HCL ER 150 MG PO CP24: 150.0000 mg | ORAL_CAPSULE | Freq: Every day | ORAL | 3 refills | Status: DC

## 2020-08-30 MED ORDER — AMLODIPINE BESYLATE 5 MG PO TABS: 5.0000 mg | ORAL_TABLET | Freq: Every day | ORAL | 3 refills | Status: DC

## 2020-08-30 MED ORDER — VENLAFAXINE HCL ER 37.5 MG PO CP24: 37.5000 mg | ORAL_CAPSULE | Freq: Every day | ORAL | 3 refills | Status: DC

## 2020-08-30 NOTE — Assessment & Plan Note (Signed)
Doing well on venlafaxine ER 150 mg and venlafaxine ER 37.5 mg.  Continue same, refill sent to pharmacy.

## 2020-08-30 NOTE — Progress Notes (Signed)
Subjective:    Patient ID: Elaine Young, female    DOB: 30-Jan-1961, 60 y.o.   MRN: [id number]  HPI  This visit occurred during the SARS-CoV-2 public health emergency.  Safety protocols were in place, including screening questions prior to the visit, additional usage of staff PPE, and extensive cleaning of exam room while observing appropriate contact time as indicated for disinfecting solutions.   Elaine Young is a 60 year old female who presents today for complete physical.  Immunizations: -Tetanus: 2017 -Influenza: Due -Shingles: Completed 2 doses of Shingrix -Covid-19: Completed three vaccines  Diet: She endorses a fair diet.  Exercise: She is walking 10,000 steps five days weekly  Eye exam: Due this year. Dental exam: Completes semi-annually   Pap Smear: Hysterectomy  Mammogram: Feb. 2021 Colonoscopy: Never completed, never completed Cologuard.  Hep C Screen: Negative  BP Readings from Last 3 Encounters:  08/30/20 110/62  03/08/20 (!) 100/53  11/17/19 (!) 120/49   Wt Readings from Last 3 Encounters:  08/30/20 202 lb (91.6 kg)  08/03/20 200 lb (90.7 kg)  03/07/20 205 lb 7.5 oz (93.2 kg)     Review of Systems  Constitutional: Negative for unexpected weight change.  HENT: Negative for rhinorrhea.   Eyes: Negative for visual disturbance.  Respiratory: Negative for cough and shortness of breath.   Cardiovascular: Negative for chest pain.  Gastrointestinal: Negative for constipation and diarrhea.  Genitourinary: Negative for difficulty urinating.  Musculoskeletal: Negative for arthralgias and myalgias.  Skin: Negative for rash.  Allergic/Immunologic: Negative for environmental allergies.  Neurological: Negative for dizziness, numbness and headaches.  Psychiatric/Behavioral: The patient is not nervous/anxious.        Feels well managed on venlafaxine dose.       Past Medical History:  Diagnosis Date  . Carpal tunnel syndrome   . Cervical disc disorder    . Complication of anesthesia   . Essential hypertension   . Functional incontinence   . Generalized anxiety disorder   . Hyperlipidemia   . Lower extremity numbness   . Lumbar disc disease   . Migraines   . PONV (postoperative nausea and vomiting)    per pt, she usually gets zofran and scopalamine patch   . Renal disorder    kidney stone  . Seasonal allergies   . Vitamin D deficiency      Social History   Socioeconomic History  . Marital status: Married    Spouse name: Not on file  . Number of children: Not on file  . Years of education: Not on file  . Highest education level: Not on file  Occupational History  . Not on file  Tobacco Use  . Smoking status: Never Smoker  . Smokeless tobacco: Never Used  Vaping Use  . Vaping Use: Never used  Substance and Sexual Activity  . Alcohol use: Yes    Alcohol/week: 0.0 standard drinks  . Drug use: No  . Sexual activity: Not on file  Other Topics Concern  . Not on file  Social History Narrative   Married.   1 child. 1 grandchild.   Retired.    Enjoys reading.    Social Determinants of Health   Financial Resource Strain: Not on file  Food Insecurity: Not on file  Transportation Needs: Not on file  Physical Activity: Not on file  Stress: Not on file  Social Connections: Not on file  Intimate Partner Violence: Not on file    Past Surgical History:  Procedure Laterality Date  .  ABDOMINAL EXPOSURE N/A 11/16/2019   Procedure: ABDOMINAL EXPOSURE;  Surgeon: Cephus Shelling, MD;  Location: [facility] OR;  Service: Vascular;  Laterality: N/A;  . ABDOMINAL HYSTERECTOMY    . ANTERIOR LUMBAR FUSION N/A 11/16/2019   Procedure: LUMBAR FIVE-SACRAL ONE ANTERIOR LUMBAR INTERBODY FUSION;  Surgeon: Tia Alert, MD;  Location: [facility] OR;  Service: Neurosurgery;  Laterality: N/A;  anterior approach  . BLADDER SUSPENSION  2005  . BREAST BIOPSY Bilateral yrs ago in [place]   high risk lesions, not cancer per pt  . BREAST CYST EXCISION Bilateral  yrs ago in [place]   hard to see some excision areas  . CHOLECYSTECTOMY    . TONSILLECTOMY      Family History  Problem Relation Age of Onset  . Hyperlipidemia Mother   . Breast cancer Mother 59  . Ovarian cancer Maternal Aunt   . Lung cancer Maternal Uncle   . Arthritis Maternal Grandmother   . Arthritis Maternal Grandfather   . Colon cancer Maternal Grandfather   . Arthritis Paternal Grandmother   . Hypertension Paternal Grandmother   . Diabetes Paternal Grandmother   . Arthritis Paternal Grandfather   . Diabetes Paternal Grandfather   . Arthritis Cousin   . Breast cancer Cousin        maternal side x2    Allergies  Allergen Reactions  . Codeine Nausea And Vomiting  . Iodine Other (See Comments)    Redness around lips  . Latex Rash  . Lipitor [Atorvastatin] Rash  . Shellfish Allergy Swelling and Rash  . Sulfa Antibiotics Rash    Current Outpatient Medications on File Prior to Visit  Medication Sig Dispense Refill  . acetaminophen (TYLENOL) 500 MG tablet Take 500 mg by mouth every 6 (six) hours as needed (for pain.).    Marland Kitchen gabapentin (NEURONTIN) 300 MG capsule Take 300 mg by mouth in the morning and at bedtime.     . meloxicam (MOBIC) 15 MG tablet Take 15 mg by mouth at bedtime.     . methocarbamol (ROBAXIN) 500 MG tablet Take 1 tablet (500 mg total) by mouth every 6 (six) hours as needed for muscle spasms. 60 tablet 1   No current facility-administered medications on file prior to visit.    BP 110/62   Pulse 83   Temp 98.6 F (37 C) (Temporal)   Ht 5\' 8"  (1.727 m)   Wt 202 lb (91.6 kg)   SpO2 97%   BMI 30.71 kg/m    Objective:   Physical Exam Constitutional:      Appearance: She is well-nourished.  HENT:     Right Ear: Tympanic membrane and ear canal normal.     Left Ear: Tympanic membrane and ear canal normal.     Mouth/Throat:     Mouth: Oropharynx is clear and moist.  Eyes:     Extraocular Movements: EOM normal.     Pupils: Pupils are equal, round,  and reactive to light.  Cardiovascular:     Rate and Rhythm: Normal rate and regular rhythm.  Pulmonary:     Effort: Pulmonary effort is normal.     Breath sounds: Normal breath sounds.  Abdominal:     General: Bowel sounds are normal.     Palpations: Abdomen is soft.     Tenderness: There is no abdominal tenderness.  Musculoskeletal:        General: Normal range of motion.     Cervical back: Neck supple.  Skin:    General: Skin  is warm and dry.  Neurological:     Mental Status: She is alert and oriented to person, place, and time.     Cranial Nerves: No cranial nerve deficit.     Deep Tendon Reflexes:     Reflex Scores:      Patellar reflexes are 2+ on the right side and 2+ on the left side. Psychiatric:        Mood and Affect: Mood and affect and mood normal.            Assessment & Plan:

## 2020-08-30 NOTE — Patient Instructions (Signed)
Stop by the lab prior to leaving today. I will notify you of your results once received.   Continue exercising. You should be getting 150 minutes of moderate intensity exercise weekly.  Continue to work on a healthy diet.  Ensure you are drinking 64 ounces of water daily.  Complete the Cologuard test once received.  Call the [facility] to schedule your mammogram.   It was a pleasure to see you today!   Preventive Care 90-61 Years Old, Female Preventive care refers to lifestyle choices and visits with your health care provider that can promote health and wellness. This includes:  A yearly physical exam. This is also called an annual wellness visit.  Regular dental and eye exams.  Immunizations.  Screening for certain conditions.  Healthy lifestyle choices, such as: ? Eating a healthy diet. ? Getting regular exercise. ? Not using drugs or products that contain nicotine and tobacco. ? Limiting alcohol use. What can I expect for my preventive care visit? Physical exam Your health care provider will check your:  Height and weight. These may be used to calculate your BMI (body mass index). BMI is a measurement that tells if you are at a healthy weight.  Heart rate and blood pressure.  Body temperature.  Skin for abnormal spots. Counseling Your health care provider may ask you questions about your:  Past medical problems.  Family's medical history.  Alcohol, tobacco, and drug use.  Emotional well-being.  Home life and relationship well-being.  Sexual activity.  Diet, exercise, and sleep habits.  Work and work Statistician.  Access to firearms.  Method of birth control.  Menstrual cycle.  Pregnancy history. What immunizations do I need? Vaccines are usually given at various ages, according to a schedule. Your health care provider will recommend vaccines for you based on your age, medical history, and lifestyle or other factors, such as travel or where you  work.   What tests do I need? Blood tests  Lipid and cholesterol levels. These may be checked every 5 years, or more often if you are over 38 years old.  Hepatitis C test.  Hepatitis B test. Screening  Lung cancer screening. You may have this screening every year starting at age 47 if you have a 30-pack-year history of smoking and currently smoke or have quit within the past 15 years.  Colorectal cancer screening. ? All adults should have this screening starting at age 78 and continuing until age 70. ? Your health care provider may recommend screening at age 78 if you are at increased risk. ? You will have tests every 1-10 years, depending on your results and the type of screening test.  Diabetes screening. ? This is done by checking your blood sugar (glucose) after you have not eaten for a while (fasting). ? You may have this done every 1-3 years.  Mammogram. ? This may be done every 1-2 years. ? Talk with your health care provider about when you should start having regular mammograms. This may depend on whether you have a family history of breast cancer.  BRCA-related cancer screening. This may be done if you have a family history of breast, ovarian, tubal, or peritoneal cancers.  Pelvic exam and Pap test. ? This may be done every 3 years starting at age 2. ? Starting at age 26, this may be done every 5 years if you have a Pap test in combination with an HPV test. Other tests  STD (sexually transmitted disease) testing, if you are  at risk.  Bone density scan. This is done to screen for osteoporosis. You may have this scan if you are at high risk for osteoporosis. Talk with your health care provider about your test results, treatment options, and if necessary, the need for more tests. Follow these instructions at home: Eating and drinking  Eat a diet that includes fresh fruits and vegetables, whole grains, lean protein, and low-fat dairy products.  Take vitamin and mineral  supplements as recommended by your health care provider.  Do not drink alcohol if: ? Your health care provider tells you not to drink. ? You are pregnant, may be pregnant, or are planning to become pregnant.  If you drink alcohol: ? Limit how much you have to 0-1 drink a day. ? Be aware of how much alcohol is in your drink. In the [place] one drink equals one 12 oz bottle of beer (355 mL), one 5 oz glass of wine (148 mL), or one 1 oz glass of hard liquor (44 mL).   Lifestyle  Take daily care of your teeth and gums. Brush your teeth every morning and night with fluoride toothpaste. Floss one time each day.  Stay active. Exercise for at least 30 minutes 5 or more days each week.  Do not use any products that contain nicotine or tobacco, such as cigarettes, e-cigarettes, and chewing tobacco. If you need help quitting, ask your health care provider.  Do not use drugs.  If you are sexually active, practice safe sex. Use a condom or other form of protection to prevent STIs (sexually transmitted infections).  If you do not wish to become pregnant, use a form of birth control. If you plan to become pregnant, see your health care provider for a prepregnancy visit.  If told by your health care provider, take low-dose aspirin daily starting at age 74.  Find healthy ways to cope with stress, such as: ? Meditation, yoga, or listening to music. ? Journaling. ? Talking to a trusted person. ? Spending time with friends and family. Safety  Always wear your seat belt while driving or riding in a vehicle.  Do not drive: ? If you have been drinking alcohol. Do not ride with someone who has been drinking. ? When you are tired or distracted. ? While texting.  Wear a helmet and other protective equipment during sports activities.  If you have firearms in your house, make sure you follow all gun safety procedures. What's next?  Visit your health care provider once a year for an annual wellness  visit.  Ask your health care provider how often you should have your eyes and teeth checked.  Stay up to date on all vaccines. This information is not intended to replace advice given to you by your health care provider. Make sure you discuss any questions you have with your health care provider. Document Revised: 03/22/2020 Document Reviewed: 02/27/2018 Elsevier Patient Education  2021 [employer].

## 2020-08-30 NOTE — Assessment & Plan Note (Signed)
Using gabapentin 300 mg twice daily, every day. Using methocarbamol and meloxicam as needed, continue same.

## 2020-08-30 NOTE — Assessment & Plan Note (Signed)
Repeat lipid panel pending, continue rosuvastatin 10 mg daily.

## 2020-08-30 NOTE — Assessment & Plan Note (Signed)
Well controlled in the office today, continue amlodipine 5 mg.  

## 2020-08-30 NOTE — Assessment & Plan Note (Signed)
Declines influenza vaccine, other vaccines up-to-date. Mammogram due, orders placed. Colonoscopy screening overdue, she agrees to complete Cologuard this year.  She declines colonoscopy despite recommendations.  Discussed the importance of a healthy diet and regular exercise in order for weight loss, and to reduce the risk of any potential medical problems.  Exam today unremarkable. Labs pending.

## 2020-08-30 NOTE — Assessment & Plan Note (Signed)
Repeat A1C pending.  Discussed the importance of a healthy diet and regular exercise in order for weight loss, and to reduce the risk of any potential medical problems.  

## 2020-08-31 LAB — HIV ANTIBODY (ROUTINE TESTING W REFLEX): HIV 1&2 Ab, 4th Generation: NONREACTIVE

## 2020-10-15 ENCOUNTER — Other Ambulatory Visit: Payer: Self-pay

## 2020-10-15 ENCOUNTER — Encounter (HOSPITAL_COMMUNITY): Payer: Self-pay

## 2020-10-15 ENCOUNTER — Inpatient Hospital Stay (HOSPITAL_COMMUNITY)
Admission: EM | Admit: 2020-10-15 | Discharge: 2020-10-19 | DRG: 392 | Disposition: A | Discharge status: Home / Self Care | Payer: BLUE CROSS/BLUE SHIELD | Attending: Emergency Medicine | Admitting: Emergency Medicine

## 2020-10-15 ENCOUNTER — Emergency Department (HOSPITAL_COMMUNITY): Payer: BLUE CROSS/BLUE SHIELD

## 2020-10-15 DIAGNOSIS — Z8041 Family history of malignant neoplasm of ovary: Secondary | ICD-10-CM | POA: Diagnosis not present

## 2020-10-15 DIAGNOSIS — K5900 Constipation, unspecified: Secondary | ICD-10-CM

## 2020-10-15 DIAGNOSIS — N2889 Other specified disorders of kidney and ureter: Secondary | ICD-10-CM | POA: Diagnosis not present

## 2020-10-15 DIAGNOSIS — Z882 Allergy status to sulfonamides status: Secondary | ICD-10-CM

## 2020-10-15 DIAGNOSIS — E669 Obesity, unspecified: Secondary | ICD-10-CM | POA: Diagnosis not present

## 2020-10-15 DIAGNOSIS — Z803 Family history of malignant neoplasm of breast: Secondary | ICD-10-CM | POA: Diagnosis not present

## 2020-10-15 DIAGNOSIS — Z91013 Allergy to seafood: Secondary | ICD-10-CM

## 2020-10-15 DIAGNOSIS — E785 Hyperlipidemia, unspecified: Secondary | ICD-10-CM | POA: Diagnosis not present

## 2020-10-15 DIAGNOSIS — Z6830 Body mass index (BMI) 30.0-30.9, adult: Secondary | ICD-10-CM

## 2020-10-15 DIAGNOSIS — F411 Generalized anxiety disorder: Secondary | ICD-10-CM | POA: Diagnosis present

## 2020-10-15 DIAGNOSIS — Z91040 Latex allergy status: Secondary | ICD-10-CM

## 2020-10-15 DIAGNOSIS — Z87442 Personal history of urinary calculi: Secondary | ICD-10-CM

## 2020-10-15 DIAGNOSIS — Z885 Allergy status to narcotic agent status: Secondary | ICD-10-CM

## 2020-10-15 DIAGNOSIS — Z83438 Family history of other disorder of lipoprotein metabolism and other lipidemia: Secondary | ICD-10-CM | POA: Diagnosis not present

## 2020-10-15 DIAGNOSIS — Z8249 Family history of ischemic heart disease and other diseases of the circulatory system: Secondary | ICD-10-CM

## 2020-10-15 DIAGNOSIS — Z801 Family history of malignant neoplasm of trachea, bronchus and lung: Secondary | ICD-10-CM

## 2020-10-15 DIAGNOSIS — R1031 Right lower quadrant pain: Secondary | ICD-10-CM | POA: Diagnosis present

## 2020-10-15 DIAGNOSIS — Z888 Allergy status to other drugs, medicaments and biological substances status: Secondary | ICD-10-CM

## 2020-10-15 DIAGNOSIS — Z800 Family history of malignant neoplasm of digestive organs: Secondary | ICD-10-CM

## 2020-10-15 DIAGNOSIS — Z833 Family history of diabetes mellitus: Secondary | ICD-10-CM | POA: Diagnosis not present

## 2020-10-15 DIAGNOSIS — I10 Essential (primary) hypertension: Secondary | ICD-10-CM | POA: Diagnosis present

## 2020-10-15 DIAGNOSIS — Z20822 Contact with and (suspected) exposure to covid-19: Secondary | ICD-10-CM | POA: Diagnosis present

## 2020-10-15 DIAGNOSIS — K5750 Diverticulosis of both small and large intestine without perforation or abscess without bleeding: Secondary | ICD-10-CM | POA: Diagnosis not present

## 2020-10-15 DIAGNOSIS — Z90710 Acquired absence of both cervix and uterus: Secondary | ICD-10-CM | POA: Diagnosis not present

## 2020-10-15 DIAGNOSIS — R109 Unspecified abdominal pain: Secondary | ICD-10-CM | POA: Diagnosis not present

## 2020-10-15 DIAGNOSIS — Z981 Arthrodesis status: Secondary | ICD-10-CM | POA: Diagnosis not present

## 2020-10-15 LAB — COMPREHENSIVE METABOLIC PANEL
ALT: 32 U/L (ref 0–44)
AST: 26 U/L (ref 15–41)
Albumin: 4.1 g/dL (ref 3.5–5.0)
Alkaline Phosphatase: 90 U/L (ref 38–126)
Anion gap: 8 (ref 5–15)
BUN: 17 mg/dL (ref 6–20)
CO2: 26 mmol/L (ref 22–32)
Calcium: 9.6 mg/dL (ref 8.9–10.3)
Chloride: 104 mmol/L (ref 98–111)
Creatinine, Ser: 0.82 mg/dL (ref 0.44–1.00)
GFR, Estimated: >60 mL/min (ref >60)
Glucose, Bld: 147 mg/dL — ABNORMAL HIGH (ref 70–99)
Potassium: 3.9 mmol/L (ref 3.5–5.1)
Sodium: 138 mmol/L (ref 135–145)
Total Bilirubin: 0.5 mg/dL (ref 0.3–1.2)
Total Protein: 7.1 g/dL (ref 6.5–8.1)

## 2020-10-15 LAB — CBC WITH DIFFERENTIAL/PLATELET
Abs Immature Granulocytes: 0.02 10*3/uL (ref 0.00–0.07)
Basophils Absolute: 0 10*3/uL (ref 0.0–0.1)
Basophils Relative: 0 %
Eosinophils Absolute: 0 10*3/uL (ref 0.0–0.5)
Eosinophils Relative: 0 %
HCT: 40.6 % (ref 36.0–46.0)
Hemoglobin: 13.8 g/dL (ref 12.0–15.0)
Immature Granulocytes: 0 %
Lymphocytes Relative: 30 %
Lymphs Abs: 2 10*3/uL (ref 0.7–4.0)
MCH: 30.9 pg (ref 26.0–34.0)
MCHC: 34 g/dL (ref 30.0–36.0)
MCV: 90.8 fL (ref 80.0–100.0)
Monocytes Absolute: 0.6 10*3/uL (ref 0.1–1.0)
Monocytes Relative: 9 %
Neutro Abs: 4 10*3/uL (ref 1.7–7.7)
Neutrophils Relative %: 61 %
Platelets: 263 10*3/uL (ref 150–400)
RBC: 4.47 MIL/uL (ref 3.87–5.11)
RDW: 13 % (ref 11.5–15.5)
WBC: 6.6 10*3/uL (ref 4.0–10.5)
nRBC: 0 % (ref 0.0–0.2)

## 2020-10-15 LAB — LIPASE, BLOOD: Lipase: 27 U/L (ref 11–51)

## 2020-10-15 NOTE — ED Triage Notes (Signed)
Pt reports that this morning around 0300am started having right lower abdominal pain, pain has worsened throughout the day, nauseated. Tender to palpation.

## 2020-10-15 NOTE — ED Triage Notes (Addendum)
Emergency Medicine Provider Triage Evaluation Note  Elaine Young , a 60 y.o. female  was evaluated in triage.  Pt complains of right-sided abdominal pain with associated nausea.  Symptoms began today but worsened throughout the day..  Review of Systems  Positive: Abdominal pain Negative: Changes to bowel movements  Physical Exam  There were no vitals taken for this visit. Gen:   Awake, no distress   HEENT:  Atraumatic  Resp:  Normal effort  Cardiac:  Normal rate  Abd:   Nondistended, right lower quadrant tenderness MSK:   Moves extremities without difficulty  Neuro:  Speech clear   Medical Decision Making  Medically screening exam initiated at 7:47 PM.  Appropriate orders placed.  Elaine Young was informed that the remainder of the evaluation will be completed by another provider, this initial triage assessment does not replace that evaluation, and the importance of remaining in the ED until their evaluation is complete.  Clinical Impression  We will obtain lab work and CT of abdomen pelvis   10:55 PM I attempted to order CT of abdomen pelvis but unfortunately patient has a contrast dye allergy and requires premedication, as told to me by the CT tech.  We will have to wait till she is in the room to order these medications and obtain CT scan.   Elaine Pates, PA-C 10/15/20 2255

## 2020-10-16 ENCOUNTER — Observation Stay (HOSPITAL_COMMUNITY): Payer: BLUE CROSS/BLUE SHIELD

## 2020-10-16 ENCOUNTER — Emergency Department (HOSPITAL_COMMUNITY): Payer: BLUE CROSS/BLUE SHIELD

## 2020-10-16 DIAGNOSIS — R109 Unspecified abdominal pain: Secondary | ICD-10-CM | POA: Diagnosis not present

## 2020-10-16 DIAGNOSIS — R1031 Right lower quadrant pain: Secondary | ICD-10-CM | POA: Diagnosis not present

## 2020-10-16 DIAGNOSIS — Z90710 Acquired absence of both cervix and uterus: Secondary | ICD-10-CM | POA: Diagnosis not present

## 2020-10-16 LAB — URINALYSIS, ROUTINE W REFLEX MICROSCOPIC
Bilirubin Urine: NEGATIVE
Glucose, UA: NEGATIVE mg/dL
Ketones, ur: NEGATIVE mg/dL
Nitrite: NEGATIVE
Protein, ur: NEGATIVE mg/dL
Specific Gravity, Urine: 1.006 (ref 1.005–1.030)
pH: 6 (ref 5.0–8.0)

## 2020-10-16 LAB — CREATININE, SERUM
Creatinine, Ser: 0.68 mg/dL (ref 0.44–1.00)
GFR, Estimated: >60 mL/min (ref >60)

## 2020-10-16 LAB — CBC
HCT: 39.5 % (ref 36.0–46.0)
Hemoglobin: 13.3 g/dL (ref 12.0–15.0)
MCH: 31.1 pg (ref 26.0–34.0)
MCHC: 33.7 g/dL (ref 30.0–36.0)
MCV: 92.3 fL (ref 80.0–100.0)
Platelets: 241 10*3/uL (ref 150–400)
RBC: 4.28 MIL/uL (ref 3.87–5.11)
RDW: 13 % (ref 11.5–15.5)
WBC: 4.7 10*3/uL (ref 4.0–10.5)
nRBC: 0 % (ref 0.0–0.2)

## 2020-10-16 LAB — SARS CORONAVIRUS 2 (TAT 6-24 HRS): SARS Coronavirus 2: NEGATIVE

## 2020-10-16 MED ORDER — ONDANSETRON HCL 4 MG/2ML IJ SOLN
4.0000 mg | Freq: Four times a day (QID) | INTRAMUSCULAR | Status: DC | PRN
  Administered 2020-10-16 – 2020-10-17 (×3): 4 mg via INTRAVENOUS
  Filled 2020-10-16 (×6): qty 2

## 2020-10-16 MED ORDER — MAGNESIUM CITRATE PO SOLN
1.0000 | Freq: Once | ORAL | Status: AC
  Administered 2020-10-16: 1 via ORAL
  Filled 2020-10-16: qty 296

## 2020-10-16 MED ORDER — VENLAFAXINE HCL ER 37.5 MG PO CP24
37.5000 mg | ORAL_CAPSULE | Freq: Every day | ORAL | Status: DC
  Administered 2020-10-16 – 2020-10-18 (×3): 37.5 mg via ORAL
  Filled 2020-10-16 (×4): qty 1

## 2020-10-16 MED ORDER — POLYETHYLENE GLYCOL 3350 17 G PO PACK
17.0000 g | PACK | Freq: Two times a day (BID) | ORAL | Status: DC
  Administered 2020-10-16 – 2020-10-18 (×3): 17 g via ORAL
  Filled 2020-10-16 (×6): qty 1

## 2020-10-16 MED ORDER — OXYCODONE HCL 5 MG PO TABS
5.0000 mg | ORAL_TABLET | ORAL | Status: DC | PRN
  Administered 2020-10-16 – 2020-10-18 (×6): 10 mg via ORAL
  Filled 2020-10-16 (×8): qty 2

## 2020-10-16 MED ORDER — ROSUVASTATIN CALCIUM 5 MG PO TABS
10.0000 mg | ORAL_TABLET | Freq: Every day | ORAL | Status: DC
  Administered 2020-10-17 – 2020-10-19 (×3): 10 mg via ORAL
  Filled 2020-10-16 (×3): qty 2

## 2020-10-16 MED ORDER — SODIUM CHLORIDE 0.9 % IV SOLN: INTRAVENOUS | Status: DC

## 2020-10-16 MED ORDER — DIPHENHYDRAMINE HCL 50 MG/ML IJ SOLN: 25.0000 mg | Freq: Four times a day (QID) | INTRAMUSCULAR | Status: DC | PRN

## 2020-10-16 MED ORDER — LACTATED RINGERS IV BOLUS
1000.0000 mL | Freq: Once | INTRAVENOUS | Status: AC
  Administered 2020-10-16: 1000 mL via INTRAVENOUS

## 2020-10-16 MED ORDER — VENLAFAXINE HCL ER 75 MG PO CP24
150.0000 mg | ORAL_CAPSULE | Freq: Every day | ORAL | Status: DC
  Administered 2020-10-16 – 2020-10-18 (×3): 150 mg via ORAL
  Filled 2020-10-16: qty 1
  Filled 2020-10-16 (×3): qty 2

## 2020-10-16 MED ORDER — ONDANSETRON 4 MG PO TBDP: 4.0000 mg | ORAL_TABLET | Freq: Four times a day (QID) | ORAL | Status: DC | PRN

## 2020-10-16 MED ORDER — METOPROLOL TARTRATE 5 MG/5ML IV SOLN: 5.0000 mg | Freq: Four times a day (QID) | INTRAVENOUS | Status: DC | PRN

## 2020-10-16 MED ORDER — AMLODIPINE BESYLATE 5 MG PO TABS
5.0000 mg | ORAL_TABLET | Freq: Every day | ORAL | Status: DC
  Administered 2020-10-16 – 2020-10-19 (×4): 5 mg via ORAL
  Filled 2020-10-16 (×4): qty 1

## 2020-10-16 MED ORDER — SIMETHICONE 80 MG PO CHEW
40.0000 mg | CHEWABLE_TABLET | Freq: Four times a day (QID) | ORAL | Status: DC | PRN
  Administered 2020-10-16: 40 mg via ORAL
  Filled 2020-10-16: qty 1

## 2020-10-16 MED ORDER — METRONIDAZOLE IN NACL 5-0.79 MG/ML-% IV SOLN: 500.0000 mg | Freq: Three times a day (TID) | INTRAVENOUS | Status: DC

## 2020-10-16 MED ORDER — ACETAMINOPHEN 650 MG RE SUPP: 650.0000 mg | Freq: Four times a day (QID) | RECTAL | Status: DC | PRN

## 2020-10-16 MED ORDER — MORPHINE SULFATE (PF) 4 MG/ML IV SOLN
4.0000 mg | Freq: Once | INTRAVENOUS | Status: AC
  Administered 2020-10-16: 4 mg via INTRAVENOUS
  Filled 2020-10-16: qty 1

## 2020-10-16 MED ORDER — SODIUM CHLORIDE 0.9 % IV SOLN: 2.0000 g | INTRAVENOUS | Status: DC

## 2020-10-16 MED ORDER — HYDROMORPHONE HCL 1 MG/ML IJ SOLN
1.0000 mg | INTRAMUSCULAR | Status: DC | PRN
  Administered 2020-10-16 – 2020-10-17 (×6): 1 mg via INTRAVENOUS
  Filled 2020-10-16 (×6): qty 1

## 2020-10-16 MED ORDER — ENOXAPARIN SODIUM 40 MG/0.4ML ~~LOC~~ SOLN
40.0000 mg | SUBCUTANEOUS | Status: DC
  Administered 2020-10-16 – 2020-10-19 (×4): 40 mg via SUBCUTANEOUS
  Filled 2020-10-16 (×4): qty 0.4

## 2020-10-16 MED ORDER — DOCUSATE SODIUM 100 MG PO CAPS
100.0000 mg | ORAL_CAPSULE | Freq: Two times a day (BID) | ORAL | Status: DC
  Administered 2020-10-16 – 2020-10-18 (×4): 100 mg via ORAL
  Filled 2020-10-16 (×7): qty 1

## 2020-10-16 MED ORDER — KETOROLAC TROMETHAMINE 30 MG/ML IJ SOLN
30.0000 mg | Freq: Four times a day (QID) | INTRAMUSCULAR | Status: DC | PRN
  Administered 2020-10-16 – 2020-10-17 (×3): 30 mg via INTRAVENOUS
  Filled 2020-10-16 (×3): qty 1

## 2020-10-16 MED ORDER — GABAPENTIN 300 MG PO CAPS
300.0000 mg | ORAL_CAPSULE | Freq: Two times a day (BID) | ORAL | Status: DC
  Administered 2020-10-16 – 2020-10-19 (×7): 300 mg via ORAL
  Filled 2020-10-16 (×7): qty 1

## 2020-10-16 MED ORDER — ACETAMINOPHEN 325 MG PO TABS: 650.0000 mg | ORAL_TABLET | Freq: Four times a day (QID) | ORAL | Status: DC | PRN

## 2020-10-16 MED ORDER — HYDROMORPHONE HCL 1 MG/ML IJ SOLN
1.0000 mg | Freq: Once | INTRAMUSCULAR | Status: AC
Start: 2020-10-16 — End: 2020-10-16
  Administered 2020-10-16: 1 mg via INTRAVENOUS
  Filled 2020-10-16: qty 1

## 2020-10-16 MED ORDER — DIPHENHYDRAMINE HCL 25 MG PO CAPS: 25.0000 mg | ORAL_CAPSULE | Freq: Four times a day (QID) | ORAL | Status: DC | PRN

## 2020-10-16 MED ORDER — ONDANSETRON HCL 4 MG/2ML IJ SOLN
4.0000 mg | Freq: Once | INTRAMUSCULAR | Status: AC
  Administered 2020-10-16: 4 mg via INTRAVENOUS
  Filled 2020-10-16: qty 2

## 2020-10-16 NOTE — ED Notes (Signed)
Attempted report x 1. Per unit secretary, charge nurse is looking as to who will be the nurse for the patient. Will call back again in .

## 2020-10-16 NOTE — ED Notes (Signed)
UC sent down to main lab with UA

## 2020-10-16 NOTE — ED Notes (Signed)
Attempted to eat some beef broth, few sips made pt nauseous. Unable to tolerate PO.

## 2020-10-16 NOTE — ED Provider Notes (Signed)
[facility] EMERGENCY DEPARTMENT Provider Note   CSN: [id number] Arrival date & time: 10/15/20  1901   History No chief complaint on file.   Elaine Young is a 60 y.o. female.  The history is provided by the patient.  She has history of migraines, hypertension, hyperlipidemia, prediabetes and comes in complaining of right lower quadrant pain which started yesterday morning and has been getting progressively worse.  She currently rates pain at 7/10.  It is made worse by any palpation or movement.  It is better if she stays still.  There has been associated nausea with some dry heaves.  She denies constipation or diarrhea and denies any urinary difficulty.  She has not taken anything for pain.  She denies fever, chills, sweats.  Pain does not radiate.  Past Medical History:  Diagnosis Date  . Carpal tunnel syndrome   . Cervical disc disorder   . Complication of anesthesia   . Essential hypertension   . Functional incontinence   . Generalized anxiety disorder   . Hyperlipidemia   . Lower extremity numbness   . Lumbar disc disease   . Migraines   . PONV (postoperative nausea and vomiting)    per pt, she usually gets zofran and scopalamine patch   . Renal disorder    kidney stone  . Seasonal allergies   . Vitamin D deficiency     Patient Active Problem List   Diagnosis Date Noted  . Cough 08/03/2020  . S/P lumbar fusion 11/16/2019  . Palpitations 12/17/2018  . Chronic lower back pain 06/02/2018  . Family history of brain aneurysm 04/03/2016  . Preventative health care 04/03/2016  . Prediabetes 04/03/2016  . Essential hypertension 08/09/2015  . Hyperlipidemia 08/09/2015  . Anxiety and depression 08/09/2015  . Lower extremity numbness 08/09/2015    Past Surgical History:  Procedure Laterality Date  . ABDOMINAL EXPOSURE N/A 11/16/2019   Procedure: ABDOMINAL EXPOSURE;  Surgeon: Cephus Shelling, MD;  Location: [facility] OR;  Service: Vascular;   Laterality: N/A;  . ABDOMINAL HYSTERECTOMY    . ANTERIOR LUMBAR FUSION N/A 11/16/2019   Procedure: LUMBAR FIVE-SACRAL ONE ANTERIOR LUMBAR INTERBODY FUSION;  Surgeon: Tia Alert, MD;  Location: [facility] OR;  Service: Neurosurgery;  Laterality: N/A;  anterior approach  . BLADDER SUSPENSION  2005  . BREAST BIOPSY Bilateral yrs ago in [place]   high risk lesions, not cancer per pt  . BREAST CYST EXCISION Bilateral yrs ago in [place]   hard to see some excision areas  . CHOLECYSTECTOMY    . TONSILLECTOMY       OB History   No obstetric history on file.     Family History  Problem Relation Age of Onset  . Hyperlipidemia Mother   . Breast cancer Mother 56  . Ovarian cancer Maternal Aunt   . Lung cancer Maternal Uncle   . Arthritis Maternal Grandmother   . Arthritis Maternal Grandfather   . Colon cancer Maternal Grandfather   . Arthritis Paternal Grandmother   . Hypertension Paternal Grandmother   . Diabetes Paternal Grandmother   . Arthritis Paternal Grandfather   . Diabetes Paternal Grandfather   . Arthritis Cousin   . Breast cancer Cousin        maternal side x2    Social History   Tobacco Use  . Smoking status: Never Smoker  . Smokeless tobacco: Never Used  Vaping Use  . Vaping Use: Never used  Substance Use Topics  . Alcohol use:  Yes    Alcohol/week: 0.0 standard drinks  . Drug use: No    Home Medications Prior to Admission medications   Medication Sig Start Date End Date Taking? Authorizing Provider  acetaminophen (TYLENOL) 500 MG tablet Take 500 mg by mouth every 6 (six) hours as needed (for pain.).   Yes [provider]  amLODipine (NORVASC) 5 MG tablet Take 1 tablet (5 mg total) by mouth daily. For blood pressure. 08/30/20  Yes Doreene Nest, NP  gabapentin (NEURONTIN) 300 MG capsule Take 300 mg by mouth in the morning and at bedtime.  10/13/19  Yes [provider]  ibuprofen (ADVIL) 200 MG tablet Take 800 mg by mouth every 6 (six) hours as needed for  headache or moderate pain.   Yes [provider]  meloxicam (MOBIC) 15 MG tablet Take 15 mg by mouth at bedtime as needed for pain. 10/13/19  Yes [provider]  methocarbamol (ROBAXIN) 500 MG tablet Take 1 tablet (500 mg total) by mouth every 6 (six) hours as needed for muscle spasms. 11/17/19  Yes Tia Alert, MD  rosuvastatin (CRESTOR) 10 MG tablet Take 1 tablet (10 mg total) by mouth daily. For cholesterol. 08/30/20  Yes Doreene Nest, NP  venlafaxine XR (EFFEXOR-XR) 150 MG 24 hr capsule Take 1 capsule (150 mg total) by mouth at bedtime. TAKE 1 CAPSULE BY MOUTH  DAILY WITH BREAKFAST for anxiety and depression. Take with 37.5 mg capsule. Patient taking differently: Take 150 mg by mouth at bedtime. 08/30/20  Yes Doreene Nest, NP  venlafaxine XR (EFFEXOR-XR) 37.5 MG 24 hr capsule Take 1 capsule (37.5 mg total) by mouth at bedtime. TAKE 1 CAPSULE BY MOUTH  DAILY WITH BREAKFAST for anxiety and depression. TAKE  WITH 150 MG CAPSULE. Patient taking differently: Take 37.5 mg by mouth at bedtime. 08/30/20  Yes Doreene Nest, NP    Allergies    Codeine, Iodine, Latex, Lipitor [atorvastatin], Shellfish allergy, and Sulfa antibiotics  Review of Systems   Review of Systems  All other systems reviewed and are negative.   Physical Exam Updated Vital Signs BP (!) 162/76   Pulse 76   Temp 98.4 F (36.9 C) (Oral)   Resp (!) 21   Ht 5\' 8"  (1.727 m)   Wt 90.7 kg   SpO2 97%   BMI 30.41 kg/m   Physical Exam Vitals and nursing note reviewed.   60 year old female, resting comfortably and in no acute distress. Vital signs are significant for elevated blood pressure and borderline elevated respiratory rate. Oxygen saturation is 97%, which is normal. Head is normocephalic and atraumatic. PERRLA, EOMI. Oropharynx is clear. Neck is nontender and supple without adenopathy or JVD. Back is nontender and there is no CVA tenderness. Lungs are clear without rales, wheezes, or  rhonchi. Chest is nontender. Heart has regular rate and rhythm without murmur. Abdomen is soft, flat, with marked right lower quadrant tenderness fairly well localized in McBurney's area.  There is voluntary guarding and she will not cooperate to allow testing for rebound tenderness but there is tenderness to percussion.  There are no masses or hepatosplenomegaly and peristalsis is hypoactive. Extremities have no cyanosis or edema, full range of motion is present. Skin is warm and dry without rash. Neurologic: Mental status is normal, cranial nerves are intact, there are no motor or sensory deficits.  ED Results / Procedures / Treatments   Labs (all labs ordered are listed, but only abnormal results are displayed) Labs  Reviewed  COMPREHENSIVE METABOLIC PANEL - Abnormal; Notable for the following components:      Result Value   Glucose, Bld 147 (*)    All other components within normal limits  CBC WITH DIFFERENTIAL/PLATELET  LIPASE, BLOOD  URINALYSIS, ROUTINE W REFLEX MICROSCOPIC    EKG None  Radiology No results found.  Procedures Procedures   Medications Ordered in ED Medications  lactated ringers bolus 1,000 mL (has no administration in time range)  morphine 4 MG/ML injection 4 mg (has no administration in time range)  ondansetron (ZOFRAN) injection 4 mg (has no administration in time range)    ED Course  I have reviewed the triage vital signs and the nursing notes.  Pertinent labs & imaging results that were available during my care of the patient were reviewed by me and considered in my medical decision making (see chart for details).  MDM Rules/Calculators/A&P Right lower quadrant pain concerning for appendicitis.  Considered diverticulitis, urolithiasis, pyelonephritis, pancreatitis.  Labs are reassuring.  WBC is normal with normal differential and metabolic panel is significant only for borderline elevated glucose of 147.  Lipase is normal.  Old records were  reviewed, and she was seen in emergency on 03/08/2020 at which time CT scan showed no evidence of renal calculi but presence of colonic diverticulosis.  She is given IV fluids, ondansetron, morphine and will be sent for CT of abdomen and pelvis.  She has an allergy to IV dye, scan will be obtained without IV contrast.  She had very little relief of pain in spite of 2 doses of morphine.  She will be given a dose of hydromorphone.  CT scan did not show the cause for her pain.  No evidence of appendicitis, diverticulitis, ureterolithiasis.  Patient is reexamined and she continues to have exquisite right lower quadrant tenderness.  Surgical consultation will be requested.  Case is signed out to Dr. Audley Hose.  Final Clinical Impression(s) / ED Diagnoses Final diagnoses:  RLQ abdominal pain    Rx / DC Orders ED Discharge Orders    None       Dione Booze, MD 10/16/20 [phone]

## 2020-10-16 NOTE — ED Notes (Signed)
Patient returned from Ultrasound. 

## 2020-10-16 NOTE — ED Notes (Signed)
Admit team ordered colace, mag citrate and miralax for pt. Pt states she is not constipated. MD paged to clarify order.

## 2020-10-16 NOTE — H&P (Signed)
Admission Note  Elaine Young [birth year]  [id number].    Requesting MD: Dr. Preston Fleeting Chief Complaint/Reason for Consult: RLQ pain  HPI:  Patient is a 60 year old female who presented to [facility] with abdominal pain that started Friday night. Patient initially thought pain might be gas but became concerned with pain progressively worsened over the course of yesterday and she was unable to stand secondary to pain yesterday evening. Pain located in RLQ, non-radiating, worse with movement. Denies fever, chills, chest pain, SOB, urinary symptoms. She reports she had some diarrhea earlier in the week on Monday and took a lot of imodium, diarrhea resolved and her last BM was Friday. She reports some nausea and reflux, no emesis. She reports she has had constipation in the past but this feels different from that. PMH otherwise significant for HTN, HLD, generalized anxiety. Past abdominal surgery includes laparoscopic cholecystectomy and abdominal hysterectomy. No blood thinning medications. Allergies listed in chart reviewed.    ROS: Review of Systems  Constitutional: Negative for chills and fever.  Respiratory: Negative for shortness of breath and wheezing.   Cardiovascular: Negative for chest pain and palpitations.  Gastrointestinal: Positive for abdominal pain, constipation, diarrhea and nausea. Negative for blood in stool, melena and vomiting.  Genitourinary: Negative for dysuria, frequency and urgency.  All other systems reviewed and are negative.   Family History  Problem Relation Age of Onset  . Hyperlipidemia Mother   . Breast cancer Mother 74  . Ovarian cancer Maternal Aunt   . Lung cancer Maternal Uncle   . Arthritis Maternal Grandmother   . Arthritis Maternal Grandfather   . Colon cancer Maternal Grandfather   . Arthritis Paternal Grandmother   . Hypertension Paternal Grandmother   . Diabetes Paternal Grandmother   . Arthritis Paternal Grandfather   . Diabetes Paternal  Grandfather   . Arthritis Cousin   . Breast cancer Cousin        maternal side x2    Past Medical History:  Diagnosis Date  . Carpal tunnel syndrome   . Cervical disc disorder   . Complication of anesthesia   . Essential hypertension   . Functional incontinence   . Generalized anxiety disorder   . Hyperlipidemia   . Lower extremity numbness   . Lumbar disc disease   . Migraines   . PONV (postoperative nausea and vomiting)    per pt, she usually gets zofran and scopalamine patch   . Renal disorder    kidney stone  . Seasonal allergies   . Vitamin D deficiency     Past Surgical History:  Procedure Laterality Date  . ABDOMINAL EXPOSURE N/A 11/16/2019   Procedure: ABDOMINAL EXPOSURE;  Surgeon: Cephus Shelling, MD;  Location: [facility] OR;  Service: Vascular;  Laterality: N/A;  . ABDOMINAL HYSTERECTOMY    . ANTERIOR LUMBAR FUSION N/A 11/16/2019   Procedure: LUMBAR FIVE-SACRAL ONE ANTERIOR LUMBAR INTERBODY FUSION;  Surgeon: Tia Alert, MD;  Location: [facility] OR;  Service: Neurosurgery;  Laterality: N/A;  anterior approach  . BLADDER SUSPENSION  2005  . BREAST BIOPSY Bilateral yrs ago in [place]   high risk lesions, not cancer per pt  . BREAST CYST EXCISION Bilateral yrs ago in [place]   hard to see some excision areas  . CHOLECYSTECTOMY    . TONSILLECTOMY      Social History:  reports that she has never smoked. She has never used smokeless tobacco. She reports current alcohol use. She reports that she  does not use drugs.  Allergies:  Allergies  Allergen Reactions  . Codeine Nausea And Vomiting  . Iodine Other (See Comments)    Redness around lips  . Latex Rash  . Lipitor [Atorvastatin] Rash  . Shellfish Allergy Swelling and Rash  . Sulfa Antibiotics Rash    (Not in a hospital admission)   Blood pressure (!) 156/86, pulse 85, temperature 98.4 F (36.9 C), temperature source Oral, resp. rate 15, height 5\' 8"  (1.727 m), weight 90.7 kg, SpO2 95 %. Physical Exam:  General:  pleasant, WD, obese female who is laying in bed in NAD HEENT: head is normocephalic, atraumatic.  Sclera are anicteric.  PERRL.  Ears and nose without any masses or lesions.  Mouth is pink and moist Heart: regular, rate, and rhythm.  Normal s1,s2. No obvious murmurs, gallops, or rubs noted.  Palpable radial and pedal pulses bilaterally Lungs: CTAB, no wheezes, rhonchi, or rales noted.  Respiratory effort nonlabored Abd: soft, focally ttp in RLQ with peritonitis, ND, +BS, no masses, hernias, or organomegaly MS: all 4 extremities are symmetrical with no cyanosis, clubbing, or edema. Skin: warm and dry with no masses, lesions, or rashes Neuro: Cranial nerves 2-12 grossly intact, sensation is normal throughout Psych: A&Ox3 with an anxious affect.   Results for orders placed or performed during the hospital encounter of 10/15/20 (from the past 48 hour(s))  Comprehensive metabolic panel     Status: Abnormal   Collection Time: 10/15/20  7:48 PM  Result Value Ref Range   Sodium 138 135 - 145 mmol/L   Potassium 3.9 3.5 - 5.1 mmol/L   Chloride 104 98 - 111 mmol/L   CO2 26 22 - 32 mmol/L   Glucose, Bld 147 (H) 70 - 99 mg/dL    Comment: Glucose reference range applies only to samples taken after fasting for at least 8 hours.   BUN 17 6 - 20 mg/dL   Creatinine, Ser 10/17/20 0.44 - 1.00 mg/dL   Calcium 9.6 8.9 - 7.10 mg/dL   Total Protein 7.1 6.5 - 8.1 g/dL   Albumin 4.1 3.5 - 5.0 g/dL   AST 26 15 - 41 U/L   ALT 32 0 - 44 U/L   Alkaline Phosphatase 90 38 - 126 U/L   Total Bilirubin 0.5 0.3 - 1.2 mg/dL   GFR, Estimated 62.6 >94 mL/min    Comment: (NOTE) Calculated using the CKD-EPI Creatinine Equation (2021)    Anion gap 8 5 - 15    Comment: Performed at [facility], [address], [place], [place]  CBC with Differential     Status: None   Collection Time: 10/15/20  7:48 PM  Result Value Ref Range   WBC 6.6 4.0 - 10.5 K/uL   RBC 4.47 3.87 - 5.11 MIL/uL   Hemoglobin 13.8 12.0 -  15.0 g/dL   HCT 10/17/20 35.0 - 09.3 %   MCV 90.8 80.0 - 100.0 fL   MCH 30.9 26.0 - 34.0 pg   MCHC 34.0 30.0 - 36.0 g/dL   RDW 81.8 29.9 - 37.1 %   Platelets 263 150 - 400 K/uL   nRBC 0.0 0.0 - 0.2 %   Neutrophils Relative % 61 %   Neutro Abs 4.0 1.7 - 7.7 K/uL   Lymphocytes Relative 30 %   Lymphs Abs 2.0 0.7 - 4.0 K/uL   Monocytes Relative 9 %   Monocytes Absolute 0.6 0.1 - 1.0 K/uL   Eosinophils Relative 0 %   Eosinophils  Absolute 0.0 0.0 - 0.5 K/uL   Basophils Relative 0 %   Basophils Absolute 0.0 0.0 - 0.1 K/uL   Immature Granulocytes 0 %   Abs Immature Granulocytes 0.02 0.00 - 0.07 K/uL    Comment: Performed at [facility], [address] [place], [place] [address]  Lipase, blood     Status: None   Collection Time: 10/15/20  7:48 PM  Result Value Ref Range   Lipase 27 11 - 51 U/L    Comment: Performed at [facility], [address] [place], [place] [address]   CT ABDOMEN PELVIS WO CONTRAST  Result Date: 10/16/2020 CLINICAL DATA:  Right lower quadrant pain with appendicitis suspected. Allergy to contrast. EXAM: CT ABDOMEN AND PELVIS WITHOUT CONTRAST TECHNIQUE: Multidetector CT imaging of the abdomen and pelvis was performed following the standard protocol without IV contrast. COMPARISON:  03/07/2020 FINDINGS: Lower chest:  No contributory findings. Hepatobiliary: No focal liver abnormality.Cholecystectomy. No bile duct dilatation. Pancreas: Unremarkable. Spleen: Unremarkable. Adrenals/Urinary Tract: Negative adrenals. Right caliectasis. No urinary calculus. Unremarkable bladder. Stomach/Bowel: No evidence of bowel inflammation including appendicitis. Distal colonic diverticulosis. Vascular/Lymphatic: No acute vascular abnormality. Scattered atheromatous calcification. No mass or adenopathy. Reproductive:Hysterectomy.  Stable high-density at the left adnexa. Other: No ascites or pneumoperitoneum. Musculoskeletal: No acute abnormalities.  L5-S1 solid arthrodesis.  IMPRESSION: 1. No urolithiasis or appendicitis. 2. Distal colonic diverticulosis. Electronically Signed   By: Marnee Spring M.D.   On: 10/16/2020 07:03      Assessment/Plan HTN HLD GAD  RLQ abdominal pain - No leukocytosis, CT without concern for appendicitis but significant stool burden in R colon - pelvic US without any GYN etiology for pain - admit for observation and treat constipation - will monitor CBC and abdominal exam - if not improving, may need to consider repeat CT vs diagnostic laparoscopy with possible appendectomy  - ok to have clears today, NPO after MN  FEN: CLD, IVF; bowel regimen VTE: lovenox ID: no current abx  Juliet Rude, [facility] Surgery 10/16/2020, 8:24 AM Please see Amion for pager number during day hours 7:00am-4:30pm

## 2020-10-16 NOTE — ED Notes (Signed)
General surgery called back. Plan is to give pt the laxatives as pt has some constipation as seen in CT. Constipation not in CT report. Clarified with surgery. Pt will need laxatives.

## 2020-10-17 DIAGNOSIS — R1031 Right lower quadrant pain: Secondary | ICD-10-CM | POA: Diagnosis not present

## 2020-10-17 LAB — CBC
HCT: 37.4 % (ref 36.0–46.0)
Hemoglobin: 12.4 g/dL (ref 12.0–15.0)
MCH: 30.8 pg (ref 26.0–34.0)
MCHC: 33.2 g/dL (ref 30.0–36.0)
MCV: 93 fL (ref 80.0–100.0)
Platelets: 221 10*3/uL (ref 150–400)
RBC: 4.02 MIL/uL (ref 3.87–5.11)
RDW: 13.2 % (ref 11.5–15.5)
WBC: 4.5 10*3/uL (ref 4.0–10.5)
nRBC: 0 % (ref 0.0–0.2)

## 2020-10-17 LAB — BASIC METABOLIC PANEL
Anion gap: 4 — ABNORMAL LOW (ref 5–15)
BUN: 11 mg/dL (ref 6–20)
CO2: 26 mmol/L (ref 22–32)
Calcium: 8.6 mg/dL — ABNORMAL LOW (ref 8.9–10.3)
Chloride: 107 mmol/L (ref 98–111)
Creatinine, Ser: 0.68 mg/dL (ref 0.44–1.00)
GFR, Estimated: >60 mL/min (ref >60)
Glucose, Bld: 104 mg/dL — ABNORMAL HIGH (ref 70–99)
Potassium: 4.1 mmol/L (ref 3.5–5.1)
Sodium: 137 mmol/L (ref 135–145)

## 2020-10-17 MED ORDER — MAGNESIUM CITRATE PO SOLN
1.0000 | Freq: Once | ORAL | Status: AC
  Administered 2020-10-17: 1 via ORAL

## 2020-10-17 MED ORDER — SODIUM CHLORIDE 0.9 % IV SOLN
12.5000 mg | Freq: Four times a day (QID) | INTRAVENOUS | Status: DC | PRN
  Administered 2020-10-17: 12.5 mg via INTRAVENOUS
  Filled 2020-10-17 (×2): qty 0.5

## 2020-10-17 MED ORDER — MAGNESIUM CITRATE PO SOLN
0.5000 | Freq: Once | ORAL | Status: DC
  Filled 2020-10-17: qty 296

## 2020-10-17 MED ORDER — FLEET ENEMA 7-19 GM/118ML RE ENEM
1.0000 | ENEMA | Freq: Once | RECTAL | Status: AC
  Administered 2020-10-17: 1 via RECTAL
  Filled 2020-10-17: qty 1

## 2020-10-17 NOTE — Progress Notes (Signed)
[facility] Progress Note     Subjective: CC-  Feels the same as yesterday, no better and no worse. Continues to complain of RLQ pain and nausea. No emesis. Passing small amount of flatus. No BM yesterday. WBC 4.5, afebrile.  Never had a colonoscopy.  Objective: Vital signs in last 24 hours: Temp:  [97.6 F (36.4 C)-98.2 F (36.8 C)] 97.9 F (36.6 C) (04/18 0514) Pulse Rate:  [78-86] 80 (04/18 0514) Resp:  [11-21] 12 (04/18 0514) BP: (107-129)/(57-75) 107/58 (04/18 0514) SpO2:  [88 %-100 %] 92 % (04/18 0514) Last BM Date: 10/14/20  Intake/Output from previous day: 04/17 0701 - 04/18 0700 In: 2219.2 [I.V.:1219.2; IV Piggyback:1000] Out: -  Intake/Output this shift: No intake/output data recorded.  PE: Gen:  Alert, NAD, pleasant HEENT: EOM's intact, pupils equal and round Card:  RRR Pulm:  CTAB, no W/R/R, rate and effort normal Abd: Soft, ND, +BS, no HSM, focal RLQ TTP with voluntary guarding, no peritonitis  Ext:  no BUE/BLE edema, calves soft and nontender Psych: A&Ox4  Skin: no rashes noted, warm and dry  Lab Results:  Recent Labs    10/16/20 0807 10/17/20 0232  WBC 4.7 4.5  HGB 13.3 12.4  HCT 39.5 37.4  PLT 241 221   BMET Recent Labs    10/15/20 1948 10/16/20 0807 10/17/20 0232  NA 138  --  137  K 3.9  --  4.1  CL 104  --  107  CO2 26  --  26  GLUCOSE 147*  --  104*  BUN 17  --  11  CREATININE 0.82 0.68 0.68  CALCIUM 9.6  --  8.6*   PT/INR No results for input(s): LABPROT, INR in the last 72 hours. CMP     Component Value Date/Time   NA 137 10/17/2020 0232   NA 140 03/29/2016 1202   K 4.1 10/17/2020 0232   CL 107 10/17/2020 0232   CO2 26 10/17/2020 0232   GLUCOSE 104 (H) 10/17/2020 0232   BUN 11 10/17/2020 0232   BUN 13 03/29/2016 1202   CREATININE 0.68 10/17/2020 0232   CALCIUM 8.6 (L) 10/17/2020 0232   PROT 7.1 10/15/2020 1948   PROT 7.2 03/29/2016 1202   ALBUMIN 4.1 10/15/2020 1948   ALBUMIN 4.4 03/29/2016 1202    AST 26 10/15/2020 1948   ALT 32 10/15/2020 1948   ALKPHOS 90 10/15/2020 1948   BILITOT 0.5 10/15/2020 1948   BILITOT 0.4 03/29/2016 1202   GFRNONAA >60 10/17/2020 0232   GFRAA >60 03/07/2020 1733   Lipase     Component Value Date/Time   LIPASE 27 10/15/2020 1948       Studies/Results: CT ABDOMEN PELVIS WO CONTRAST  Result Date: 10/16/2020 CLINICAL DATA:  Right lower quadrant pain with appendicitis suspected. Allergy to contrast. EXAM: CT ABDOMEN AND PELVIS WITHOUT CONTRAST TECHNIQUE: Multidetector CT imaging of the abdomen and pelvis was performed following the standard protocol without IV contrast. COMPARISON:  03/07/2020 FINDINGS: Lower chest:  No contributory findings. Hepatobiliary: No focal liver abnormality.Cholecystectomy. No bile duct dilatation. Pancreas: Unremarkable. Spleen: Unremarkable. Adrenals/Urinary Tract: Negative adrenals. Right caliectasis. No urinary calculus. Unremarkable bladder. Stomach/Bowel: No evidence of bowel inflammation including appendicitis. Distal colonic diverticulosis. Vascular/Lymphatic: No acute vascular abnormality. Scattered atheromatous calcification. No mass or adenopathy. Reproductive:Hysterectomy.  Stable high-density at the left adnexa. Other: No ascites or pneumoperitoneum. Musculoskeletal: No acute abnormalities.  L5-S1 solid arthrodesis. IMPRESSION: 1. No urolithiasis or appendicitis. 2. Distal colonic diverticulosis. Electronically Signed   By: Marja Kays  Watts M.D.   On: 10/16/2020 07:03   US PELVIC COMPLETE WITH TRANSVAGINAL  Result Date: 10/16/2020 CLINICAL DATA:  RIGHT abdominal pain since Friday. Prior hysterectomy. EXAM: TRANSABDOMINAL AND TRANSVAGINAL ULTRASOUND OF PELVIS TECHNIQUE: Both transabdominal and transvaginal ultrasound examinations of the pelvis were performed. Transabdominal technique was performed for global imaging of the pelvis including uterus, ovaries, adnexal regions, and pelvic cul-de-sac. It was necessary to proceed  with endovaginal exam following the transabdominal exam to visualize the adnexal regions and vaginal cuff. COMPARISON:  CT of the abdomen and pelvis on 10/16/2020 FINDINGS: Uterus Measurements: Uterus is absent. Normal appearance of the vaginal cuff. Endometrium Thickness: Absent uterus. Right ovary Measurements: 3.2 x 1.8 x 2.1 centimeters = volume: 6.4 mL. Normal appearance/no adnexal mass. Left ovary Measurements: The ovary is not visualized, either absent or obscured. Other findings No abnormal free fluid. IMPRESSION: 1. Status post hysterectomy.  Normal appearance of vaginal cuff. 2. Normal appearance of the RIGHT ovary; nonvisualized LEFT ovary. Electronically Signed   By: Norva Pavlov M.D.   On: 10/16/2020 09:18    Anti-infectives: Anti-infectives (From admission, onward)   Start     Dose/Rate Route Frequency Ordered Stop   10/16/20 0815  cefTRIAXone (ROCEPHIN) 2 g in sodium chloride 0.9 % 100 mL IVPB  Status:  Discontinued       "And" Linked Group Details   2 g 200 mL/hr over 30 Minutes Intravenous Every 24 hours 10/16/20 0813 10/16/20 0823   10/16/20 0815  metroNIDAZOLE (FLAGYL) IVPB 500 mg  Status:  Discontinued       "And" Linked Group Details   500 mg 100 mL/hr over 60 Minutes Intravenous Every 8 hours 10/16/20 0813 10/16/20 0823       Assessment/Plan HTN HLD GAD  RLQ abdominal pain - CT 4/17 without concern for appendicitis but significant stool burden in R colon - pelvic US without any GYN etiology for pain  FEN: NPO, IVF; bowel regimen VTE: lovenox ID: no current abx  Plan: No better but no worse and WBC remains WNL and patient is afebrile. No peritonitis on exam. Continue bowel regimen today. May consider repeat CT scan if no improvement but it has only been 24 hours since her initial scan.    LOS: 0 days    Franne Forts, [facility] 10/17/2020, 8:45 AM Please see Amion for pager number during day hours 7:00am-4:30pm

## 2020-10-18 ENCOUNTER — Inpatient Hospital Stay (HOSPITAL_COMMUNITY): Payer: BLUE CROSS/BLUE SHIELD

## 2020-10-18 DIAGNOSIS — Z8249 Family history of ischemic heart disease and other diseases of the circulatory system: Secondary | ICD-10-CM | POA: Diagnosis not present

## 2020-10-18 DIAGNOSIS — E669 Obesity, unspecified: Secondary | ICD-10-CM | POA: Diagnosis present

## 2020-10-18 DIAGNOSIS — Z803 Family history of malignant neoplasm of breast: Secondary | ICD-10-CM | POA: Diagnosis not present

## 2020-10-18 DIAGNOSIS — Z981 Arthrodesis status: Secondary | ICD-10-CM | POA: Diagnosis not present

## 2020-10-18 DIAGNOSIS — E785 Hyperlipidemia, unspecified: Secondary | ICD-10-CM | POA: Diagnosis present

## 2020-10-18 DIAGNOSIS — Z888 Allergy status to other drugs, medicaments and biological substances status: Secondary | ICD-10-CM | POA: Diagnosis not present

## 2020-10-18 DIAGNOSIS — Z833 Family history of diabetes mellitus: Secondary | ICD-10-CM | POA: Diagnosis not present

## 2020-10-18 DIAGNOSIS — Z20822 Contact with and (suspected) exposure to covid-19: Secondary | ICD-10-CM | POA: Diagnosis present

## 2020-10-18 DIAGNOSIS — Z8041 Family history of malignant neoplasm of ovary: Secondary | ICD-10-CM | POA: Diagnosis not present

## 2020-10-18 DIAGNOSIS — Z91013 Allergy to seafood: Secondary | ICD-10-CM | POA: Diagnosis not present

## 2020-10-18 DIAGNOSIS — Z83438 Family history of other disorder of lipoprotein metabolism and other lipidemia: Secondary | ICD-10-CM | POA: Diagnosis not present

## 2020-10-18 DIAGNOSIS — Z885 Allergy status to narcotic agent status: Secondary | ICD-10-CM | POA: Diagnosis not present

## 2020-10-18 DIAGNOSIS — Z882 Allergy status to sulfonamides status: Secondary | ICD-10-CM | POA: Diagnosis not present

## 2020-10-18 DIAGNOSIS — F411 Generalized anxiety disorder: Secondary | ICD-10-CM | POA: Diagnosis present

## 2020-10-18 DIAGNOSIS — Z6830 Body mass index (BMI) 30.0-30.9, adult: Secondary | ICD-10-CM | POA: Diagnosis not present

## 2020-10-18 DIAGNOSIS — R1031 Right lower quadrant pain: Secondary | ICD-10-CM | POA: Diagnosis present

## 2020-10-18 DIAGNOSIS — K5900 Constipation, unspecified: Secondary | ICD-10-CM | POA: Diagnosis present

## 2020-10-18 DIAGNOSIS — Z87442 Personal history of urinary calculi: Secondary | ICD-10-CM | POA: Diagnosis not present

## 2020-10-18 DIAGNOSIS — I10 Essential (primary) hypertension: Secondary | ICD-10-CM | POA: Diagnosis present

## 2020-10-18 DIAGNOSIS — Z801 Family history of malignant neoplasm of trachea, bronchus and lung: Secondary | ICD-10-CM | POA: Diagnosis not present

## 2020-10-18 DIAGNOSIS — Z91040 Latex allergy status: Secondary | ICD-10-CM | POA: Diagnosis not present

## 2020-10-18 DIAGNOSIS — Z800 Family history of malignant neoplasm of digestive organs: Secondary | ICD-10-CM | POA: Diagnosis not present

## 2020-10-18 LAB — CBC
HCT: 38.8 % (ref 36.0–46.0)
Hemoglobin: 12.8 g/dL (ref 12.0–15.0)
MCH: 30.8 pg (ref 26.0–34.0)
MCHC: 33 g/dL (ref 30.0–36.0)
MCV: 93.3 fL (ref 80.0–100.0)
Platelets: 224 10*3/uL (ref 150–400)
RBC: 4.16 MIL/uL (ref 3.87–5.11)
RDW: 13.1 % (ref 11.5–15.5)
WBC: 5.7 10*3/uL (ref 4.0–10.5)
nRBC: 0 % (ref 0.0–0.2)

## 2020-10-18 LAB — MAGNESIUM: Magnesium: 2.3 mg/dL (ref 1.7–2.4)

## 2020-10-18 LAB — BASIC METABOLIC PANEL
Anion gap: 4 — ABNORMAL LOW (ref 5–15)
BUN: 8 mg/dL (ref 6–20)
CO2: 29 mmol/L (ref 22–32)
Calcium: 8.7 mg/dL — ABNORMAL LOW (ref 8.9–10.3)
Chloride: 105 mmol/L (ref 98–111)
Creatinine, Ser: 0.71 mg/dL (ref 0.44–1.00)
GFR, Estimated: >60 mL/min (ref >60)
Glucose, Bld: 89 mg/dL (ref 70–99)
Potassium: 3.8 mmol/L (ref 3.5–5.1)
Sodium: 138 mmol/L (ref 135–145)

## 2020-10-18 MED ORDER — DIPHENHYDRAMINE HCL 25 MG PO CAPS: 50.0000 mg | ORAL_CAPSULE | Freq: Once | ORAL | Status: AC

## 2020-10-18 MED ORDER — BUTALBITAL-APAP-CAFFEINE 50-325-40 MG PO TABS
1.0000 | ORAL_TABLET | Freq: Four times a day (QID) | ORAL | Status: DC | PRN
  Administered 2020-10-18: 1 via ORAL
  Filled 2020-10-18: qty 1

## 2020-10-18 MED ORDER — HYDROCORTISONE NA SUCCINATE PF 250 MG IJ SOLR
200.0000 mg | Freq: Once | INTRAMUSCULAR | Status: DC
  Filled 2020-10-18: qty 200

## 2020-10-18 MED ORDER — IOHEXOL 9 MG/ML PO SOLN
ORAL | Status: AC
  Administered 2020-10-18: 500 mL
  Filled 2020-10-18: qty 1000

## 2020-10-18 MED ORDER — DIPHENHYDRAMINE HCL 25 MG PO CAPS: 50.0000 mg | ORAL_CAPSULE | Freq: Once | ORAL | Status: DC

## 2020-10-18 MED ORDER — DIPHENHYDRAMINE HCL 50 MG/ML IJ SOLN: 50.0000 mg | Freq: Once | INTRAMUSCULAR | Status: DC

## 2020-10-18 MED ORDER — PREDNISONE 50 MG PO TABS
50.0000 mg | ORAL_TABLET | Freq: Four times a day (QID) | ORAL | Status: DC
Start: 2020-10-18 — End: 2020-10-18

## 2020-10-18 MED ORDER — HYDROCORTISONE NA SUCCINATE PF 250 MG IJ SOLR
200.0000 mg | Freq: Once | INTRAMUSCULAR | Status: AC
  Administered 2020-10-18: 200 mg via INTRAVENOUS
  Filled 2020-10-18: qty 200

## 2020-10-18 MED ORDER — DIPHENHYDRAMINE HCL 50 MG/ML IJ SOLN
50.0000 mg | Freq: Once | INTRAMUSCULAR | Status: AC
  Administered 2020-10-18: 50 mg via INTRAVENOUS
  Filled 2020-10-18: qty 1

## 2020-10-18 MED ORDER — IOHEXOL 300 MG/ML  SOLN
100.0000 mL | Freq: Once | INTRAMUSCULAR | Status: AC | PRN
  Administered 2020-10-18: 100 mL via INTRAVENOUS

## 2020-10-18 NOTE — Progress Notes (Signed)
[facility] Progress Note     Subjective: CC-  Patient reports having 4-5 BMs since yesterday with good volume of stool. The last couple bowel movements have been loose/watery. No longer nauseated, but states that RLQ abdominal pain is unchanged. WBC 5.7, afebrile  Objective: Vital signs in last 24 hours: Temp:  [98.2 F (36.8 C)-98.4 F (36.9 C)] 98.2 F (36.8 C) (04/19 0459) Pulse Rate:  [71-86] 71 (04/19 0821) Resp:  [16-18] 18 (04/19 0821) BP: (120-124)/(59-69) 124/69 (04/19 0821) SpO2:  [87 %-93 %] 93 % (04/19 0821) Last BM Date: 10/17/20  Intake/Output from previous day: 04/18 0701 - 04/19 0700 In: 2009.4 [P.O.:120; I.V.:1889.4] Out: -  Intake/Output this shift: No intake/output data recorded.  PE: Gen:  Alert, NAD, pleasant HEENT: EOM's intact, pupils equal and round Card:  RRR Pulm:  CTAB, no W/R/R, rate and effort normal Abd: Soft, ND, +BS, no HSM, focal RLQ TTP with no rebound or guarding, no peritonitis  Ext:  no BUE/BLE edema, calves soft and nontender Psych: A&Ox4  Skin: no rashes noted, warm and dry   Lab Results:  Recent Labs    10/17/20 0232 10/18/20 0143  WBC 4.5 5.7  HGB 12.4 12.8  HCT 37.4 38.8  PLT 221 224   BMET Recent Labs    10/17/20 0232 10/18/20 0143  NA 137 138  K 4.1 3.8  CL 107 105  CO2 26 29  GLUCOSE 104* 89  BUN 11 8  CREATININE 0.68 0.71  CALCIUM 8.6* 8.7*   PT/INR No results for input(s): LABPROT, INR in the last 72 hours. CMP     Component Value Date/Time   NA 138 10/18/2020 0143   NA 140 03/29/2016 1202   K 3.8 10/18/2020 0143   CL 105 10/18/2020 0143   CO2 29 10/18/2020 0143   GLUCOSE 89 10/18/2020 0143   BUN 8 10/18/2020 0143   BUN 13 03/29/2016 1202   CREATININE 0.71 10/18/2020 0143   CALCIUM 8.7 (L) 10/18/2020 0143   PROT 7.1 10/15/2020 1948   PROT 7.2 03/29/2016 1202   ALBUMIN 4.1 10/15/2020 1948   ALBUMIN 4.4 03/29/2016 1202   AST 26 10/15/2020 1948   ALT 32 10/15/2020 1948    ALKPHOS 90 10/15/2020 1948   BILITOT 0.5 10/15/2020 1948   BILITOT 0.4 03/29/2016 1202   GFRNONAA >60 10/18/2020 0143   GFRAA >60 03/07/2020 1733   Lipase     Component Value Date/Time   LIPASE 27 10/15/2020 1948       Studies/Results: No results found.  Anti-infectives: Anti-infectives (From admission, onward)   Start     Dose/Rate Route Frequency Ordered Stop   10/16/20 0815  cefTRIAXone (ROCEPHIN) 2 g in sodium chloride 0.9 % 100 mL IVPB  Status:  Discontinued       "And" Linked Group Details   2 g 200 mL/hr over 30 Minutes Intravenous Every 24 hours 10/16/20 0813 10/16/20 0823   10/16/20 0815  metroNIDAZOLE (FLAGYL) IVPB 500 mg  Status:  Discontinued       "And" Linked Group Details   500 mg 100 mL/hr over 60 Minutes Intravenous Every 8 hours 10/16/20 0813 10/16/20 0823       Assessment/Plan HTN HLD GAD  RLQ abdominal pain - CT 4/17 without concern for appendicitis but significant stool burden in R colon - pelvic US without any GYN etiology for pain  FEN: NPO, IVF; bowel regimen VTE: lovenox ID: no current abx  Plan: WBC remains WNL, VSS. Patient has  had 4-5 bowel movements but no change in abdominal pain. Will discuss role/timing of possible repeat CT scan with MD.   LOS: 0 days    Franne Forts, [facility] 10/18/2020, 9:38 AM Please see Amion for pager number during day hours 7:00am-4:30pm

## 2020-10-19 LAB — GASTROINTESTINAL PANEL BY PCR, STOOL (REPLACES STOOL CULTURE)

## 2020-10-19 MED ORDER — DOCUSATE SODIUM 100 MG PO CAPS: 100.0000 mg | ORAL_CAPSULE | Freq: Two times a day (BID) | ORAL | 0 refills | Status: DC

## 2020-10-19 MED ORDER — POLYETHYLENE GLYCOL 3350 17 G PO PACK: 17.0000 g | PACK | Freq: Every day | ORAL | 0 refills | Status: DC

## 2020-10-19 NOTE — Discharge Summary (Signed)
[facility] Discharge Summary   Patient ID: Elaine Young MRN: [id number] DOB/AGE: March 05, 1961 60 y.o.  Admit date: 10/15/2020 Discharge date: 10/19/2020  Admitting Diagnosis: RLQ pain  Discharge Diagnosis RLQ pain Constipation  Consultants None  Imaging: CT ABDOMEN PELVIS W CONTRAST  Result Date: 10/18/2020 CLINICAL DATA:  Right lower quadrant abdominal pain. EXAM: CT ABDOMEN AND PELVIS WITH CONTRAST TECHNIQUE: Multidetector CT imaging of the abdomen and pelvis was performed using the standard protocol following bolus administration of intravenous contrast. CONTRAST:  OMNIPAQUE IOHEXOL 300 MG/ML  SOLN COMPARISON:  10/16/2020 FINDINGS: Lower chest: Subsegmental atelectasis noted in the lung bases. Hepatobiliary: No suspicious focal abnormality within the liver parenchyma. Gallbladder surgically absent. No intrahepatic or extrahepatic biliary dilation. Pancreas: No focal mass lesion. No dilatation of the main duct. No intraparenchymal cyst. No peripancreatic edema. Spleen: No splenomegaly. No focal mass lesion. Adrenals/Urinary Tract: No adrenal nodule or mass. 60 mm cyst noted interpolar right kidney. Left kidney unremarkable. No evidence for hydroureter. The urinary bladder appears normal for the degree of distention. Stomach/Bowel: Stomach is unremarkable. No gastric wall thickening. No evidence of outlet obstruction. Duodenum is normally positioned as is the ligament of Treitz. Small duodenal diverticulum noted. No small bowel wall thickening. No small bowel dilatation. The terminal ileum is normal. The base of the appendix is upper normal diameter at 6-7 mm (coronal image 47/series 6) without periappendiceal edema or inflammation. Mid and distal appendix is decompressed measuring 3 mm diameter. No edema or inflammation in the region of the cecal tip. No gross colonic mass. No colonic wall thickening. Diverticular changes are noted in the left colon without evidence of  diverticulitis. Vascular/Lymphatic: There is abdominal aortic atherosclerosis without aneurysm. There is no gastrohepatic or hepatoduodenal ligament lymphadenopathy. No retroperitoneal or mesenteric lymphadenopathy. No pelvic sidewall lymphadenopathy. Reproductive: Uterus surgically absent.  There is no adnexal mass. Other: No intraperitoneal free fluid. Musculoskeletal: No worrisome lytic or sclerotic osseous abnormality. IMPRESSION: 1. No acute findings in the abdomen or pelvis. Specifically, no findings to explain the patient's history of right lower quadrant pain. 2. Left colonic diverticulosis without diverticulitis. 3. Aortic Atherosclerosis (ICD10-I70.0). Electronically Signed   By: Kennith Center M.D.   On: 10/18/2020 19:47    Procedures None  Hospital Course:  Elaine Young is a 60yo female PMH HTN, HLD, GAD, hx constipation who presented to [facility] 4/17 with acute onset RLQ abdominal pain.  Work up revealed no leukocytosis, CT without concern for appendicitis but significant stool burden in Right colon. Patient was admitted to the surgical service for observation. She was started on a bowel regimen with good benefit. Due to persistent pain and CT scan was repeated on 4/19 and showed no acute findings, constipation/ small bowel fecalization improved. After this her pain did improved. Diet was advanced as tolerated. On 4/20 the patient was tolerating diet, having bowel function, abdominal pain improved and felt stable for discharge home. She has never had a colonoscopy and will be referred to GI. Patient will follow up as below and knows to call with questions or concerns.      Allergies as of 10/19/2020      Reactions   Codeine Nausea And Vomiting   Iodine Other (See Comments)   Redness around lips   Latex Rash   Lipitor [atorvastatin] Rash   Shellfish Allergy Swelling, Rash   Sulfa Antibiotics Rash      Medication List    TAKE these medications   acetaminophen 500 MG  tablet Commonly  known as: TYLENOL Take 500 mg by mouth every 6 (six) hours as needed (for pain.).   amLODipine 5 MG tablet Commonly known as: NORVASC Take 1 tablet (5 mg total) by mouth daily. For blood pressure.   docusate sodium 100 MG capsule Commonly known as: COLACE Take 1 capsule (100 mg total) by mouth 2 (two) times daily.   gabapentin 300 MG capsule Commonly known as: NEURONTIN Take 300 mg by mouth in the morning and at bedtime.   ibuprofen 200 MG tablet Commonly known as: ADVIL Take 800 mg by mouth every 6 (six) hours as needed for headache or moderate pain.   meloxicam 15 MG tablet Commonly known as: MOBIC Take 15 mg by mouth at bedtime as needed for pain.   methocarbamol 500 MG tablet Commonly known as: ROBAXIN Take 1 tablet (500 mg total) by mouth every 6 (six) hours as needed for muscle spasms.   polyethylene glycol 17 g packet Commonly known as: MIRALAX / GLYCOLAX Take 17 g by mouth daily.   rosuvastatin 10 MG tablet Commonly known as: CRESTOR Take 1 tablet (10 mg total) by mouth daily. For cholesterol.   venlafaxine XR 150 MG 24 hr capsule Commonly known as: EFFEXOR-XR Take 1 capsule (150 mg total) by mouth at bedtime. TAKE 1 CAPSULE BY MOUTH  DAILY WITH BREAKFAST for anxiety and depression. Take with 37.5 mg capsule. What changed: additional instructions   venlafaxine XR 37.5 MG 24 hr capsule Commonly known as: EFFEXOR-XR Take 1 capsule (37.5 mg total) by mouth at bedtime. TAKE 1 CAPSULE BY MOUTH  DAILY WITH BREAKFAST for anxiety and depression. TAKE  WITH 150 MG CAPSULE. What changed: additional instructions         Follow-up Information    [facility]. Call.   Specialty: Gastroenterology Why: Their office should contact you for an appointment to discuss colonoscopy Contact information: [address] [place] [address] [phone]              Signed: Franne Forts, PA-C [facility] 10/19/2020, 1:20 PM Please see Amion for pager number during day hours 7:00am-4:30pm

## 2020-10-19 NOTE — Discharge Instructions (Signed)
High-Fiber Eating Plan Fiber, also called dietary fiber, is a type of carbohydrate. It is found foods such as fruits, vegetables, whole grains, and beans. A high-fiber diet can have many health benefits. Your health care provider may recommend a high-fiber diet to help:  Prevent constipation. Fiber can make your bowel movements more regular.  Lower your cholesterol.  Relieve the following conditions: ? Inflammation of veins in the anus (hemorrhoids). ? Inflammation of specific areas of the digestive tract (uncomplicated diverticulosis). ? A problem of the large intestine, also called the colon, that sometimes causes pain and diarrhea (irritable bowel syndrome, or IBS).  Prevent overeating as part of a weight-loss plan.  Prevent heart disease, type 2 diabetes, and certain cancers. What are tips for following this plan? Reading food labels  Check the nutrition facts label on food products for the amount of dietary fiber. Choose foods that have 5 grams of fiber or more per serving.  The goals for recommended daily fiber intake include: ? Men (age 50 or younger): 34-38 g. ? Men (over age 50): 28-34 g. ? Women (age 50 or younger): 25-28 g. ? Women (over age 50): 22-25 g. Your daily fiber goal is _____________ g.   Shopping  Choose whole fruits and vegetables instead of processed forms, such as apple juice or applesauce.  Choose a wide variety of high-fiber foods such as avocados, lentils, oats, and kidney beans.  Read the nutrition facts label of the foods you choose. Be aware of foods with added fiber. These foods often have high sugar and sodium amounts per serving. Cooking  Use whole-grain flour for baking and cooking.  Cook with brown rice instead of white rice. Meal planning  Start the day with a breakfast that is high in fiber, such as a cereal that contains 5 g of fiber or more per serving.  Eat breads and cereals that are made with whole-grain flour instead of refined  flour or white flour.  Eat brown rice, bulgur wheat, or millet instead of white rice.  Use beans in place of meat in soups, salads, and pasta dishes.  Be sure that half of the grains you eat each day are whole grains. General information  You can get the recommended daily intake of dietary fiber by: ? Eating a variety of fruits, vegetables, grains, nuts, and beans. ? Taking a fiber supplement if you are not able to take in enough fiber in your diet. It is better to get fiber through food than from a supplement.  Gradually increase how much fiber you consume. If you increase your intake of dietary fiber too quickly, you may have bloating, cramping, or gas.  Drink plenty of water to help you digest fiber.  Choose high-fiber snacks, such as berries, raw vegetables, nuts, and popcorn. What foods should I eat? Fruits Berries. Pears. Apples. Oranges. Avocado. Prunes and raisins. Dried figs. Vegetables Sweet potatoes. Spinach. Kale. Artichokes. Cabbage. Broccoli. Cauliflower. Green peas. Carrots. Squash. Grains Whole-grain breads. Multigrain cereal. Oats and oatmeal. Brown rice. Barley. Bulgur wheat. Millet. Quinoa. Bran muffins. Popcorn. Rye wafer crackers. Meats and other proteins Navy beans, kidney beans, and pinto beans. Soybeans. Split peas. Lentils. Nuts and seeds. Dairy Fiber-fortified yogurt. Beverages Fiber-fortified soy milk. Fiber-fortified orange juice. Other foods Fiber bars. The items listed above may not be a complete list of recommended foods and beverages. Contact a dietitian for more information. What foods should I avoid? Fruits Fruit juice. Cooked, strained fruit. Vegetables Fried potatoes. Canned vegetables. Well-cooked vegetables. Grains   White bread. Pasta made with refined flour. White rice. Meats and other proteins Fatty cuts of meat. Fried chicken or fried fish. Dairy Milk. Yogurt. Cream cheese. Sour cream. Fats and oils Butters. Beverages Soft  drinks. Other foods Cakes and pastries. The items listed above may not be a complete list of foods and beverages to avoid. Talk with your dietitian about what choices are best for you. Summary  Fiber is a type of carbohydrate. It is found in foods such as fruits, vegetables, whole grains, and beans.  A high-fiber diet has many benefits. It can help to prevent constipation, lower blood cholesterol, aid weight loss, and reduce your risk of heart disease, diabetes, and certain cancers.  Increase your intake of fiber gradually. Increasing fiber too quickly may cause cramping, bloating, and gas. Drink plenty of water while you increase the amount of fiber you consume.  The best sources of fiber include whole fruits and vegetables, whole grains, nuts, seeds, and beans. This information is not intended to replace advice given to you by your health care provider. Make sure you discuss any questions you have with your health care provider. Document Revised: 10/22/2019 Document Reviewed: 10/22/2019 Elsevier Patient Education  2021 [employer]  

## 2020-10-19 NOTE — Progress Notes (Signed)
[facility] Progress Note     Subjective: CC-  Abdominal pain improving. States that she started feeling somewhat better yesterday afternoon. Denies n/v. Tolerated liquids. She reports having 3-4 more BMs since yesterday morning. CT yesterday with no acute findings, constipation/ small bowel fecalization improved.  Objective: Vital signs in last 24 hours: Temp:  [97.7 F (36.5 C)-98.6 F (37 C)] 97.7 F (36.5 C) (04/20 0509) Pulse Rate:  [76-92] 76 (04/20 0509) Resp:  [16-18] 17 (04/20 0509) BP: (119-138)/(64-68) 119/64 (04/20 0509) SpO2:  [89 %-95 %] 95 % (04/20 0509) Last BM Date: 10/18/20  Intake/Output from previous day: No intake/output data recorded. Intake/Output this shift: No intake/output data recorded.  PE: Gen: Alert, NAD, pleasant HEENT: EOM's intact, pupils equal and round Card: RRR Pulm: CTAB, no W/R/R, rate and effort normal Abd: Soft,ND, +BS, no HSM, mildfocal RLQ TTP with no rebound or guarding, no peritonitis Ext: no BUE/BLE edema, calves soft and nontender Psych: A&Ox4  Skin: no rashes noted, warm and dry   Lab Results:  Recent Labs    10/17/20 0232 10/18/20 0143  WBC 4.5 5.7  HGB 12.4 12.8  HCT 37.4 38.8  PLT 221 224   BMET Recent Labs    10/17/20 0232 10/18/20 0143  NA 137 138  K 4.1 3.8  CL 107 105  CO2 26 29  GLUCOSE 104* 89  BUN 11 8  CREATININE 0.68 0.71  CALCIUM 8.6* 8.7*   PT/INR No results for input(s): LABPROT, INR in the last 72 hours. CMP     Component Value Date/Time   NA 138 10/18/2020 0143   NA 140 03/29/2016 1202   K 3.8 10/18/2020 0143   CL 105 10/18/2020 0143   CO2 29 10/18/2020 0143   GLUCOSE 89 10/18/2020 0143   BUN 8 10/18/2020 0143   BUN 13 03/29/2016 1202   CREATININE 0.71 10/18/2020 0143   CALCIUM 8.7 (L) 10/18/2020 0143   PROT 7.1 10/15/2020 1948   PROT 7.2 03/29/2016 1202   ALBUMIN 4.1 10/15/2020 1948   ALBUMIN 4.4 03/29/2016 1202   AST 26 10/15/2020 1948   ALT 32  10/15/2020 1948   ALKPHOS 90 10/15/2020 1948   BILITOT 0.5 10/15/2020 1948   BILITOT 0.4 03/29/2016 1202   GFRNONAA >60 10/18/2020 0143   GFRAA >60 03/07/2020 1733   Lipase     Component Value Date/Time   LIPASE 27 10/15/2020 1948       Studies/Results: CT ABDOMEN PELVIS W CONTRAST  Result Date: 10/18/2020 CLINICAL DATA:  Right lower quadrant abdominal pain. EXAM: CT ABDOMEN AND PELVIS WITH CONTRAST TECHNIQUE: Multidetector CT imaging of the abdomen and pelvis was performed using the standard protocol following bolus administration of intravenous contrast. CONTRAST:  OMNIPAQUE IOHEXOL 300 MG/ML  SOLN COMPARISON:  10/16/2020 FINDINGS: Lower chest: Subsegmental atelectasis noted in the lung bases. Hepatobiliary: No suspicious focal abnormality within the liver parenchyma. Gallbladder surgically absent. No intrahepatic or extrahepatic biliary dilation. Pancreas: No focal mass lesion. No dilatation of the main duct. No intraparenchymal cyst. No peripancreatic edema. Spleen: No splenomegaly. No focal mass lesion. Adrenals/Urinary Tract: No adrenal nodule or mass. 60 mm cyst noted interpolar right kidney. Left kidney unremarkable. No evidence for hydroureter. The urinary bladder appears normal for the degree of distention. Stomach/Bowel: Stomach is unremarkable. No gastric wall thickening. No evidence of outlet obstruction. Duodenum is normally positioned as is the ligament of Treitz. Small duodenal diverticulum noted. No small bowel wall thickening. No small bowel dilatation. The terminal ileum  is normal. The base of the appendix is upper normal diameter at 6-7 mm (coronal image 47/series 6) without periappendiceal edema or inflammation. Mid and distal appendix is decompressed measuring 3 mm diameter. No edema or inflammation in the region of the cecal tip. No gross colonic mass. No colonic wall thickening. Diverticular changes are noted in the left colon without evidence of diverticulitis.  Vascular/Lymphatic: There is abdominal aortic atherosclerosis without aneurysm. There is no gastrohepatic or hepatoduodenal ligament lymphadenopathy. No retroperitoneal or mesenteric lymphadenopathy. No pelvic sidewall lymphadenopathy. Reproductive: Uterus surgically absent.  There is no adnexal mass. Other: No intraperitoneal free fluid. Musculoskeletal: No worrisome lytic or sclerotic osseous abnormality. IMPRESSION: 1. No acute findings in the abdomen or pelvis. Specifically, no findings to explain the patient's history of right lower quadrant pain. 2. Left colonic diverticulosis without diverticulitis. 3. Aortic Atherosclerosis (ICD10-I70.0). Electronically Signed   By: Kennith Center M.D.   On: 10/18/2020 19:47    Anti-infectives: Anti-infectives (From admission, onward)   Start     Dose/Rate Route Frequency Ordered Stop   10/16/20 0815  cefTRIAXone (ROCEPHIN) 2 g in sodium chloride 0.9 % 100 mL IVPB  Status:  Discontinued       "And" Linked Group Details   2 g 200 mL/hr over 30 Minutes Intravenous Every 24 hours 10/16/20 0813 10/16/20 0823   10/16/20 0815  metroNIDAZOLE (FLAGYL) IVPB 500 mg  Status:  Discontinued       "And" Linked Group Details   500 mg 100 mL/hr over 60 Minutes Intravenous Every 8 hours 10/16/20 0813 10/16/20 0823       Assessment/Plan HTN HLD GAD  RLQ abdominal pain - CT4/17without concern for appendicitis but significant stool burden in R colon - pelvic US without any GYN etiology for pain - repeat CT 4/19 with no acute findings, constipation/ small bowel fecalization improved  FEN:reg diet; bowel regimen VTE: lovenox ID: no current abx  Plan: Abdominal pain improving and repeat CT yesterday reassuring. Will advance diet. If pain continues to improve and patient tolerates a diet she may be able to discharge home later today. Will plan outpatient GI referral to discuss colonoscopy.   LOS: 1 day    Franne Forts, [facility]  Surgery 10/19/2020, 8:25 AM Please see Amion for pager number during day hours 7:00am-4:30pm

## 2020-11-01 DIAGNOSIS — Z03818 Encounter for observation for suspected exposure to other biological agents ruled out: Secondary | ICD-10-CM | POA: Diagnosis not present

## 2020-11-01 DIAGNOSIS — Z20822 Contact with and (suspected) exposure to covid-19: Secondary | ICD-10-CM | POA: Diagnosis not present

## 2021-07-04 ENCOUNTER — Other Ambulatory Visit: Payer: Self-pay | Admitting: Primary Care

## 2021-07-04 DIAGNOSIS — F32A Depression, unspecified: Secondary | ICD-10-CM

## 2021-07-04 DIAGNOSIS — F411 Generalized anxiety disorder: Secondary | ICD-10-CM

## 2021-07-04 DIAGNOSIS — I10 Essential (primary) hypertension: Secondary | ICD-10-CM

## 2021-07-04 DIAGNOSIS — E785 Hyperlipidemia, unspecified: Secondary | ICD-10-CM

## 2021-07-05 NOTE — Telephone Encounter (Signed)
Send letter.  Due for CPE in March 2023

## 2021-07-12 NOTE — Telephone Encounter (Signed)
Called l/m to call office will mail letter if not call back

## 2021-07-20 NOTE — Telephone Encounter (Signed)
Patient has follow up from covid on 2/1 ok to just make note to have scheduled at that appointment? Or do you want me to call and see if she still needs covid appointment if not cancel and make cpe as directed?

## 2021-07-21 NOTE — Telephone Encounter (Signed)
Yes, please call and see. I'm happy to see her either way.

## 2021-07-28 NOTE — Telephone Encounter (Signed)
Left message to return call to our office.  

## 2021-08-02 ENCOUNTER — Ambulatory Visit (INDEPENDENT_AMBULATORY_CARE_PROVIDER_SITE_OTHER): Payer: BLUE CROSS/BLUE SHIELD | Admitting: Primary Care

## 2021-08-02 ENCOUNTER — Encounter: Payer: Self-pay | Admitting: Primary Care

## 2021-08-02 ENCOUNTER — Other Ambulatory Visit: Payer: Self-pay

## 2021-08-02 DIAGNOSIS — R058 Other specified cough: Secondary | ICD-10-CM | POA: Diagnosis not present

## 2021-08-02 HISTORY — DX: Other specified cough: R05.8

## 2021-08-02 NOTE — Assessment & Plan Note (Signed)
Since URI in November 2022, aggravated by Covid-19 a few weeks ago.   Today she does not appear sickly, lungs are clear.   Post nasal drip and GERD could be contributing.  Discussed to start Zyrtec 10 mg daily and omeprazole 20 mg daily.  She will update in 1 week.

## 2021-08-02 NOTE — Patient Instructions (Signed)
Start Zyrtec 10 mg at bedtime for drainage.  Start omeprazole 20 mg once daily for heartburn.  Please update me in 1 week!  It was a pleasure to see you today!

## 2021-08-02 NOTE — Progress Notes (Signed)
Subjective:    Patient ID: Elaine Young, female    DOB: [birth year], 61 y.o.   MRN: [id number]  HPI  Karol Skarzynski is a very pleasant 61 y.o. female with a history of hypertension, prediabetes, palpitations, hyperlipidemia who presents today to discuss cough.  Diagnosed with Covid-19 about 2 weeks ago. Symptoms included cough, chest congestion, loss of tastes, post nasal drip, body aches, headaches. She was not treated with antivirals. She took Alka-Seltzer Cold and Cough and Robitussin.   She was also sick with a viral URI in November 2022, the cough from this infection never abated.   Symptoms now are cough, chest congestion, throat drainage. She feels tired sometimes, attributes this to a recent job change. She feels much better overall since her Covid-19 infection. Her cough occurs during the day and night, worse in the morning when waking as she has a lot of drainage. She also noticed belching, esophageal burning and pressure which occurs daily after meals.   She is not taking anything for symptoms.   BP Readings from Last 3 Encounters:  08/02/21 124/72  10/19/20 (!) 119/55  08/30/20 110/62        Review of Systems  Constitutional:  Negative for chills and fever.  HENT:  Positive for congestion and postnasal drip.   Respiratory:  Positive for cough. Negative for shortness of breath.   Cardiovascular:  Negative for chest pain.  Gastrointestinal:        Belching, esophageal burning         Past Medical History:  Diagnosis Date   Carpal tunnel syndrome    Cervical disc disorder    Complication of anesthesia    Essential hypertension    Functional incontinence    Generalized anxiety disorder    Hyperlipidemia    Lower extremity numbness    Lumbar disc disease    Migraines    PONV (postoperative nausea and vomiting)    per pt, she usually gets zofran and scopalamine patch    Renal disorder    kidney stone   Seasonal allergies    Vitamin D  deficiency     Social History   Socioeconomic History   Marital status: Married    Spouse name: Not on file   Number of children: Not on file   Years of education: Not on file   Highest education level: Not on file  Occupational History   Not on file  Tobacco Use   Smoking status: Never   Smokeless tobacco: Never  Vaping Use   Vaping Use: Never used  Substance and Sexual Activity   Alcohol use: Yes    Alcohol/week: 0.0 standard drinks   Drug use: No   Sexual activity: Not on file  Other Topics Concern   Not on file  Social History Narrative   Married.   1 child. 1 grandchild.   Retired.    Enjoys reading.    Social Determinants of Health   Financial Resource Strain: Not on file  Food Insecurity: Not on file  Transportation Needs: Not on file  Physical Activity: Not on file  Stress: Not on file  Social Connections: Not on file  Intimate Partner Violence: Not on file    Past Surgical History:  Procedure Laterality Date   ABDOMINAL EXPOSURE N/A 11/16/2019   Procedure: ABDOMINAL EXPOSURE;  Surgeon: Cephus Shelling, MD;  Location: [facility] OR;  Service: Vascular;  Laterality: N/A;   ABDOMINAL HYSTERECTOMY     ANTERIOR LUMBAR FUSION N/A 11/16/2019  Procedure: LUMBAR FIVE-SACRAL ONE ANTERIOR LUMBAR INTERBODY FUSION;  Surgeon: Tia AlertJones, David S, MD;  [facility];  Service: Neurosurgery;  Laterality: N/A;  anterior approach   BLADDER SUSPENSION  2005   BREAST BIOPSY [place]ilateral yrs ago in [place]   high risk lesions, not cancer per pt   BREAST CYST EXCISION Bilateral yrs ago in [place]   hard to see some excision areas   CHOLECYSTECTOMY     TONSILLECTOMY      Family History  Problem Relation Age of Onset   Hyperlipidemia Mother    Breast cancer Mother [id number]   Ovarian cancer Maternal Aunt    Lung cancer Maternal Uncle    Arthritis Maternal Grandmother    Arthritis Maternal Grandfather    Colon cancer Maternal Grandfather    Arthritis Paternal Grandmother    Hypertension  Paternal Grandmother    Diabetes Paternal Grandmother    Arthritis Paternal Grandfather    Diabetes Paternal Grandfather    Arthritis Cousin    Breast cancer Cousin        maternal side x2    Allergies  Allergen Reactions   Codeine Nausea And Vomiting   Iodine Other (See Comments)    Redness around lips   Latex Rash   Lipitor [Atorvastatin] Rash   Shellfish Allergy Swelling and Rash   Sulfa Antibiotics Rash    Current Outpatient Medications on File Prior to Visit  Medication Sig Dispense Refill   acetaminophen (TYLENOL) 500 MG tablet Take 500 mg by mouth every 6 (six) hours as needed (for pain.).     amLODipine (NORVASC) 5 MG tablet Take 1 tablet (5 mg total) by mouth daily. for blood pressure. Due for office visit in March 2023 30 tablet 0   gabapentin (NEURONTIN) 300 MG capsule Take 300 mg by mouth in the morning and at bedtime.      ibuprofen (ADVIL) 200 MG tablet Take 800 mg by mouth every 6 (six) hours as needed for headache or moderate pain.     rosuvastatin (CRESTOR) 10 MG tablet Take 1 tablet (10 mg total) by mouth daily. For cholesterol. Due for office visit in March 2023. 30 tablet 0   venlafaxine XR (EFFEXOR-XR) 150 MG 24 hr capsule Take 1 capsule (150 mg total) by mouth daily with breakfast. For anxiety and depression. Take with 37.5 mg. Due for office visit in March 2023. 30 capsule 0   venlafaxine XR (EFFEXOR-XR) 37.5 MG 24 hr capsule Take 1 capsule (37.5 mg total) by mouth daily with breakfast. For depression and anxiety. Take with 150 mg. Due for office visit in March 2023. 30 capsule 0   No current facility-administered medications on file prior to visit.    BP 124/72    Pulse 79    Temp 98.2 F (36.8 C) (Temporal)    Ht 5\' 8"  (1.727 m)    Wt 204 lb (92.5 kg)    BMI 31.02 kg/m  Objective:   Physical Exam Constitutional:      Appearance: She is not ill-appearing.  HENT:     Right Ear: Tympanic membrane and ear canal normal.     Left Ear: Tympanic membrane and  ear canal normal.     Nose:     Right Sinus: No maxillary sinus tenderness or frontal sinus tenderness.     Left Sinus: No maxillary sinus tenderness or frontal sinus tenderness.  Eyes:     Conjunctiva/sclera: Conjunctivae normal.  Cardiovascular:     Rate and Rhythm: Normal rate and  regular rhythm.  Pulmonary:     Effort: Pulmonary effort is normal.     Breath sounds: Normal breath sounds. No wheezing or rales.     Comments: Dry cough noted several times during visit  Musculoskeletal:     Cervical back: Neck supple.  Lymphadenopathy:     Cervical: No cervical adenopathy.  Skin:    General: Skin is warm and dry.          Assessment & Plan:      This visit occurred during the SARS-CoV-2 public health emergency.  Safety protocols were in place, including screening questions prior to the visit, additional usage of staff PPE, and extensive cleaning of exam room while observing appropriate contact time as indicated for disinfecting solutions.

## 2021-08-03 NOTE — Telephone Encounter (Signed)
Pt has CPE scheduled for 09/12/21

## 2021-08-08 ENCOUNTER — Ambulatory Visit: Payer: BLUE CROSS/BLUE SHIELD | Admitting: Dermatology

## 2021-08-14 ENCOUNTER — Ambulatory Visit: Payer: BLUE CROSS/BLUE SHIELD | Admitting: Dermatology

## 2021-08-17 ENCOUNTER — Other Ambulatory Visit: Payer: Self-pay | Admitting: Primary Care

## 2021-08-17 DIAGNOSIS — E785 Hyperlipidemia, unspecified: Secondary | ICD-10-CM

## 2021-08-17 DIAGNOSIS — F411 Generalized anxiety disorder: Secondary | ICD-10-CM

## 2021-08-17 DIAGNOSIS — F419 Anxiety disorder, unspecified: Secondary | ICD-10-CM

## 2021-08-17 DIAGNOSIS — F32A Depression, unspecified: Secondary | ICD-10-CM

## 2021-08-17 DIAGNOSIS — I10 Essential (primary) hypertension: Secondary | ICD-10-CM

## 2021-08-31 ENCOUNTER — Ambulatory Visit: Payer: BLUE CROSS/BLUE SHIELD | Admitting: Dermatology

## 2021-08-31 ENCOUNTER — Other Ambulatory Visit: Payer: Self-pay

## 2021-08-31 DIAGNOSIS — L738 Other specified follicular disorders: Secondary | ICD-10-CM | POA: Diagnosis not present

## 2021-08-31 DIAGNOSIS — L700 Acne vulgaris: Secondary | ICD-10-CM | POA: Diagnosis not present

## 2021-08-31 DIAGNOSIS — L820 Inflamed seborrheic keratosis: Secondary | ICD-10-CM | POA: Diagnosis not present

## 2021-08-31 MED ORDER — ISOTRETINOIN 20 MG PO CAPS: 20.0000 mg | ORAL_CAPSULE | Freq: Every day | ORAL | 0 refills | Status: DC

## 2021-08-31 NOTE — Patient Instructions (Addendum)
While taking Isotretinoin and for 30 days after you finish the medication, do not get pregnant, do not share pills, do not donate blood.  Generic isotretinoin is best absorbed when taken with a fatty meal. Isotretinoin can make you sensitive to the sun. Daily careful sun protection including sunscreen SPF 30+ when outdoors is recommended.   Seborrheic Keratosis  What causes seborrheic keratoses? Seborrheic keratoses are harmless, common skin growths that first appear during adult life.  As time goes by, more growths appear.  Some people may develop a large number of them.  Seborrheic keratoses appear on both covered and uncovered body parts.  They are not caused by sunlight.  The tendency to develop seborrheic keratoses can be inherited.  They vary in color from skin-colored to gray, brown, or even black.  They can be either smooth or have a rough, warty surface.   Seborrheic keratoses are superficial and look as if they were stuck on the skin.  Under the microscope this type of keratosis looks like layers upon layers of skin.  That is why at times the top layer may seem to fall off, but the rest of the growth remains and re-grows.    Treatment Seborrheic keratoses do not need to be treated, but can easily be removed in the office.  Seborrheic keratoses often cause symptoms when they rub on clothing or jewelry.  Lesions can be in the way of shaving.  If they become inflamed, they can cause itching, soreness, or burning.  Removal of a seborrheic keratosis can be accomplished by freezing, burning, or surgery. If any spot bleeds, scabs, or grows rapidly, please return to have it checked, as these can be an indication of a skin cancer.   Cryotherapy Aftercare  Wash gently with soap and water everyday.   Apply Vaseline and Band-Aid daily until healed.      If You Need Anything After Your Visit  If you have any questions or concerns for your doctor, please call our main line at [phone] and  press option 4 to reach your doctor's medical assistant. If no one answers, please leave a voicemail as directed and we will return your call as soon as possible. Messages left after 4 pm will be answered the following business day.   You may also send [place] a message via MyChart. We typically respond to MyChart messages within 1-2 business days.  For prescription refills, please ask your pharmacy to contact our office. Our fax number is [phone].  If you have an urgent issue when the clinic is closed that cannot wait until the next business day, you can page your doctor at the number below.    Please note that while we do our best to be available for urgent issues outside of office hours, we are not available 24/7.   If you have an urgent issue and are unable to reach [place], you may choose to seek medical care at your doctor's office, retail clinic, urgent care center, or emergency room.  If you have a medical emergency, please immediately call 911 or go to the emergency department.  Pager Numbers  - Dr. Gwen Pounds: [phone]  - Dr. Neale Burly: [phone]  - Dr. Roseanne Reno: [phone]  In the event of inclement weather, please call our main line at [phone] for an update on the status of any delays or closures.  Dermatology Medication Tips: Please keep the boxes that topical medications come in in order to help keep track of the instructions about where and  how to use these. Pharmacies typically print the medication instructions only on the boxes and not directly on the medication tubes.   If your medication is too expensive, please contact our office at [phone] option 4 or send [place] a message through MyChart.   We are unable to tell what your co-pay for medications will be in advance as this is different depending on your insurance coverage. However, we may be able to find a substitute medication at lower cost or fill out paperwork to get insurance to cover a needed medication.   If  a prior authorization is required to get your medication covered by your insurance company, please allow [place] 1-2 business days to complete this process.  Drug prices often vary depending on where the prescription is filled and some pharmacies may offer cheaper prices.  The website www.goodrx.com contains coupons for medications through different pharmacies. The prices here do not account for what the cost may be with help from insurance (it may be cheaper with your insurance), but the website can give you the price if you did not use any insurance.  - You can print the associated coupon and take it with your prescription to the pharmacy.  - You may also stop by our office during regular business hours and pick up a GoodRx coupon card.  - If you need your prescription sent electronically to a different pharmacy, notify our office through [facility] or by phone at [phone] option 4.     Si Usted Necesita Algo Despus de Su Visita  Tambin puede enviarnos un mensaje a travs de Clinical cytogeneticist. Por lo general respondemos a los mensajes de MyChart en el transcurso de 1 a 2 das hbiles.  Para renovar recetas, por favor pida a su farmacia que se ponga en contacto con nuestra oficina. Annie Sable de fax es [place] [phone].  Si tiene un asunto urgente cuando la clnica est cerrada y que no puede esperar hasta el siguiente da hbil, puede llamar/localizar a su doctor(a) al nmero que aparece a continuacin.   Por favor, tenga en cuenta que aunque hacemos todo lo posible para estar disponibles para asuntos urgentes fuera del horario de [place], no estamos disponibles las 24 horas del da, los 7 [address]  [address] de la [place].   Si tiene un problema urgente y no puede comunicarse con nosotros, puede optar por buscar atencin mdica  en el consultorio de su doctor(a), en una clnica privada, en un centro de atencin urgente o en una sala de emergencias.  Si tiene Engineer, drilling, por favor llame  inmediatamente al 911 o vaya a la sala de emergencias.  Nmeros de bper  - Dr. Gwen Pounds: [phone]  - Dra. Moye: [phone]  - Dra. Roseanne Reno: [phone]  En caso de inclemencias del [place], por favor llame a Lacy Duverney principal al [phone] para una actualizacin sobre el [place] de cualquier retraso o cierre.  Consejos para la medicacin en dermatologa: Por favor, guarde las cajas en las que vienen los medicamentos de uso tpico para ayudarle a seguir las instrucciones sobre dnde y cmo usarlos. Las farmacias generalmente imprimen las instrucciones del medicamento slo en las cajas y no directamente en los tubos del [place].   Si su medicamento es muy caro, por favor, pngase en contacto con Rolm Gala llamando al [phone] y presione la opcin 4 o envenos un mensaje a travs de Clinical cytogeneticist.   No podemos decirle cul ser su copago por los medicamentos por adelantado ya que esto es diferente  dependiendo de la cobertura de su seguro. Sin embargo, es posible que podamos encontrar un medicamento sustituto a Audiological scientist un formulario para que el seguro cubra el medicamento que se considera necesario.   Si se requiere una autorizacin previa para que su compaa de seguros [place] su medicamento, por favor permtanos de 1 a 2 das hbiles para completar [address]  Los precios de los medicamentos varan con frecuencia dependiendo del Environmental consultant de dnde se surte la receta y alguna farmacias pueden ofrecer precios ms baratos.  El sitio web www.goodrx.com tiene cupones para medicamentos de Health and safety inspector. Los precios aqu no tienen en cuenta lo que podra costar con la ayuda del seguro (puede ser ms barato con su seguro), pero el sitio web puede darle el precio si no utiliz Tourist information centre manager.  - Puede imprimir el cupn correspondiente y llevarlo con su receta a la farmacia.  - Tambin puede pasar por nuestra oficina durante el horario de atencin regular y Education officer, museum  una tarjeta de cupones de GoodRx.  - Si necesita que su receta se enve electrnicamente a una farmacia diferente, informe a nuestra oficina a travs de MyChart de [place] o por telfono llamando al [phone] y presione la opcin 4.

## 2021-08-31 NOTE — Progress Notes (Signed)
? ?New Patient Visit ? ?Subjective  ?Elaine Young Ysabela Keisler is a 61 y.o. female who presents for the following: New Patient (Initial Visit) (Patient here today concerning a spot at back, some spots at face she would like removed. Patient denies family history of skin cancer. ). ?The patient has spots, moles and lesions to be evaluated, some may be new or changing and the patient has concerns that these could be cancer. ? ?The following portions of the chart were reviewed this encounter and updated as appropriate:  ? Tobacco  Allergies  Meds  Problems  Med Hx  Surg Hx  Fam Hx   ?  ?Review of Systems:  No other skin or systemic complaints except as noted in HPI or Assessment and Plan. ? ?Objective  ?Well appearing patient in no apparent distress; mood and affect are within normal limits. ? ?A focused examination was performed including face, back. Relevant physical exam findings are noted in the Assessment and Plan. ? ?face ?Small yellow papules with a central dell at face  ? ? ?face ?Open comedones at mid face and forehead ? ? ? ? ? ? ? ? ?upper spinal mid back x 1 ?Erythematous stuck-on, waxy papule or plaque ? ? ?Assessment & Plan  ?Sebaceous hyperplasia of face - confluent  ?With Acne vulgaris ?face ?Discussed treatment options ?Surgical not recommend ?Cauterized - will not remove just makes smooth and flat ? ?Treat with isotretinion to shrink up oil glands  ? ?Patient would like to start Isotretinion ? ?Photos today  ?Patient has hysterectomy 23 years ago still has ovaries  ?Non childbearing  ?On antidepressents ?Labs reviewed from 2022 normal ?NO pregnancy test needed  ? ?Patient has hysterectomy 23 years ago still has ovaries  ?Non childbearing  ? ?Accutane is discussed fully with the patient. It is a very effective drug to treat acne vulgaris but has many significant side effects. Chief among these are teratogenesis, hepatic injury, dyslipidemia and severe drying of the mucous membranes. All of these  issues have been discussed in details. Monthly blood tests to monitor lipids and liver functions will be necessary. Expect painful dryness and/or fissuring around the lips, eyes, and other moist areas of the body. Balms may be protective. Contact lens may be too painful to wear temporarily while on this drug. Episodes of significant depression have been reported, including suicidal ideation and attempts in rare cases. It may also cause pseudotumor cerebri and hyperostosis. The patient will report any such changes in mood, depressive symptoms or suicidal thoughts, headaches, joint or bone pains. ? ?Female patients MUST use two simultaneous methods of family planning. Accutane is Category X for pregnancy, meaning it will cause fetal teratogenic malformations, and pregnancy MUST be avoided while on this drug. ? ?Reviewed potential side effects of isotretinoin including xerosis, cheilitis, hepatitis, hyperlipidemia, and severe birth defects if taken by a pregnant woman. Reviewed reports of suicidal ideation in those with a history of depression while taking isotretinoin and reports of diagnosis of inflammatory bowl disease while taking isotretinoin as well as the lack of evidence for a causal relationship between isotretinoin, depression and IBD. Patient advised to reach out with any questions or concerns. Patient advised not to share pills or donate blood while on treatment or for one month after completing treatment. ? ?The dose is 0.5-1 mg per kg in two divided doses for 15-20 weeks. ? ?After discussion of these important issues, she indicates complete understanding of all of the above, and wishes to proceed with  accutane therapy. ? ?Will confirm in [place] ?Will send Isotretinion 20 mg by mouth daily with food. ? ?Acne vulgaris ?face ?With sebaceous hyperplasia  ?Treatment and discussion as above. ? ?Patient confirmed in iPledge and isotretinoin sent to pharmacy. Desma Mcgregor [phone] ?Patient would like sent [facility]   ?Start Isotretinoin 20 mg by mouth daily with food. ? ?ISOtretinoin (ACCUTANE) 20 MG capsule - face ?Take 1 capsule (20 mg total) by mouth daily. ? ?Inflamed seborrheic keratosis ?upper spinal mid back x 1 ?Irritated and bothers patient  ? ?Destruction of lesion - upper spinal mid back x 1 ?Complexity: simple   ?Destruction method: cryotherapy   ?Informed consent: discussed and consent obtained   ?Timeout:  patient name, date of birth, surgical site, and procedure verified ?Lesion destroyed using liquid nitrogen: Yes   ?Region frozen until ice ball extended beyond lesion: Yes   ?Outcome: patient tolerated procedure well with no complications   ?Post-procedure details: wound care instructions given   ?Additional details:  Prior to procedure, discussed risks of blister formation, small wound, skin dyspigmentation, or rare scar following cryotherapy. Recommend Vaseline ointment to treated areas while healing. ? ?Return in about 1 month (around 10/01/2021) for isotretinoin and recheck isk upper back spinal . ? ?I, Asher Muir, CMA, am acting as scribe for Armida Sans, MD. ?Documentation: I have reviewed the above documentation for accuracy and completeness, and I agree with the above. ? ?Armida Sans, MD ? ? ?

## 2021-09-01 ENCOUNTER — Encounter: Payer: Self-pay | Admitting: Dermatology

## 2021-09-12 ENCOUNTER — Encounter: Payer: Self-pay | Admitting: Primary Care

## 2021-09-12 ENCOUNTER — Other Ambulatory Visit: Payer: Self-pay

## 2021-09-12 ENCOUNTER — Ambulatory Visit (INDEPENDENT_AMBULATORY_CARE_PROVIDER_SITE_OTHER): Payer: BLUE CROSS/BLUE SHIELD | Admitting: Primary Care

## 2021-09-12 VITALS — BP 126/82 | HR 88 | Temp 98.6°F | Ht 68.0 in | Wt 206.0 lb

## 2021-09-12 DIAGNOSIS — F411 Generalized anxiety disorder: Secondary | ICD-10-CM

## 2021-09-12 DIAGNOSIS — Z1211 Encounter for screening for malignant neoplasm of colon: Secondary | ICD-10-CM

## 2021-09-12 DIAGNOSIS — F419 Anxiety disorder, unspecified: Secondary | ICD-10-CM

## 2021-09-12 DIAGNOSIS — E785 Hyperlipidemia, unspecified: Secondary | ICD-10-CM | POA: Diagnosis not present

## 2021-09-12 DIAGNOSIS — F32A Depression, unspecified: Secondary | ICD-10-CM

## 2021-09-12 DIAGNOSIS — L700 Acne vulgaris: Secondary | ICD-10-CM

## 2021-09-12 DIAGNOSIS — Z0000 Encounter for general adult medical examination without abnormal findings: Secondary | ICD-10-CM

## 2021-09-12 DIAGNOSIS — M545 Low back pain, unspecified: Secondary | ICD-10-CM

## 2021-09-12 DIAGNOSIS — Z1231 Encounter for screening mammogram for malignant neoplasm of breast: Secondary | ICD-10-CM

## 2021-09-12 DIAGNOSIS — I10 Essential (primary) hypertension: Secondary | ICD-10-CM | POA: Diagnosis not present

## 2021-09-12 DIAGNOSIS — G8929 Other chronic pain: Secondary | ICD-10-CM

## 2021-09-12 DIAGNOSIS — R7303 Prediabetes: Secondary | ICD-10-CM

## 2021-09-12 DIAGNOSIS — R058 Other specified cough: Secondary | ICD-10-CM

## 2021-09-12 DIAGNOSIS — R002 Palpitations: Secondary | ICD-10-CM

## 2021-09-12 HISTORY — DX: Acne vulgaris: L70.0

## 2021-09-12 LAB — COMPREHENSIVE METABOLIC PANEL
ALT: 21 U/L (ref 0–35)
AST: 18 U/L (ref 0–37)
Albumin: 4.8 g/dL (ref 3.5–5.2)
Alkaline Phosphatase: 87 U/L (ref 39–117)
BUN: 9 mg/dL (ref 6–23)
CO2: 30 mEq/L (ref 19–32)
Calcium: 9.9 mg/dL (ref 8.4–10.5)
Chloride: 104 mEq/L (ref 96–112)
Creatinine, Ser: 0.67 mg/dL (ref 0.40–1.20)
GFR: 94.2 mL/min (ref >60.00)
Glucose, Bld: 107 mg/dL — ABNORMAL HIGH (ref 70–99)
Potassium: 5 mEq/L (ref 3.5–5.1)
Sodium: 140 mEq/L (ref 135–145)
Total Bilirubin: 0.5 mg/dL (ref 0.2–1.2)
Total Protein: 7.3 g/dL (ref 6.0–8.3)

## 2021-09-12 LAB — HEMOGLOBIN A1C: Hgb A1c MFr Bld: 6.2 % (ref 4.6–6.5)

## 2021-09-12 LAB — CBC
HCT: 40.6 % (ref 36.0–46.0)
Hemoglobin: 13.8 g/dL (ref 12.0–15.0)
MCHC: 34 g/dL (ref 30.0–36.0)
MCV: 92 fl (ref 78.0–100.0)
Platelets: 261 10*3/uL (ref 150.0–400.0)
RBC: 4.41 Mil/uL (ref 3.87–5.11)
RDW: 13.3 % (ref 11.5–15.5)
WBC: 5.3 10*3/uL (ref 4.0–10.5)

## 2021-09-12 LAB — LIPID PANEL
Cholesterol: 173 mg/dL (ref 0–200)
HDL: 62 mg/dL (ref >39.00)
LDL Cholesterol: 91 mg/dL (ref 0–99)
NonHDL: 111.09
Total CHOL/HDL Ratio: 3
Triglycerides: 102 mg/dL (ref 0.0–149.0)
VLDL: 20.4 mg/dL (ref 0.0–40.0)

## 2021-09-12 LAB — TSH: TSH: 1.63 u[IU]/mL (ref 0.35–5.50)

## 2021-09-12 MED ORDER — VENLAFAXINE HCL ER 37.5 MG PO CP24: ORAL_CAPSULE | ORAL | 3 refills | Status: DC

## 2021-09-12 MED ORDER — VENLAFAXINE HCL ER 150 MG PO CP24: ORAL_CAPSULE | ORAL | 3 refills | Status: DC

## 2021-09-12 MED ORDER — GABAPENTIN 300 MG PO CAPS: 300.0000 mg | ORAL_CAPSULE | Freq: Two times a day (BID) | ORAL | 3 refills | Status: DC

## 2021-09-12 NOTE — Assessment & Plan Note (Signed)
Following with dermatology, now on Accutane 20 mg, plans on starting soon.  ?

## 2021-09-12 NOTE — Assessment & Plan Note (Addendum)
Controlled.  ? ?Continue venlafaxine XR 187.5 mg daily. ?Refill(s) sent to pharmacy. ? ?

## 2021-09-12 NOTE — Progress Notes (Signed)
? ?Subjective:  ? ? Patient ID: Elaine Young, female    DOB: 10-02-1960, 61 y.o.   MRN: [id number] ? ?HPI ? ?Elaine Young is a very pleasant 61 y.o. female who presents today for complete physical and follow up of chronic conditions. ? ?Immunizations: ?-Tetanus: 2017 ?-Influenza: Did not complete this season ?-Covid-19: 3 vaccines ?-Shingles: Completed Shingrix ? ? ?Diet: Fair diet.  ?Exercise: No regular exercise. ? ?Eye exam: Completes annually  ?Dental exam: Completes semi-annually  ? ?Pap Smear: Hysterectomy  ?Mammogram: Completed in 2021 ?Colonoscopy: Never completed.  Never completed Cologuard. ? ?BP Readings from Last 3 Encounters:  ?09/12/21 126/82  ?08/02/21 124/72  ?10/19/20 (!) 119/55  ? ?Wt Readings from Last 3 Encounters:  ?09/12/21 206 lb (93.4 kg)  ?08/02/21 204 lb (92.5 kg)  ?10/15/20 200 lb (90.7 kg)  ? ? ? ? ? ?Review of Systems  ?Constitutional:  Negative for unexpected weight change.  ?HENT:  Negative for rhinorrhea.   ?Respiratory:  Negative for cough and shortness of breath.   ?Cardiovascular:  Negative for chest pain.  ?Gastrointestinal:  Negative for constipation and diarrhea.  ?Genitourinary:  Negative for difficulty urinating.  ?Musculoskeletal:  Negative for arthralgias and myalgias.  ?Skin:  Negative for rash.  ?Allergic/Immunologic: Negative for environmental allergies.  ?Neurological:  Negative for dizziness and headaches.  ?Psychiatric/Behavioral:  The patient is not nervous/anxious.   ? ?   ? ? ?Past Medical History:  ?Diagnosis Date  ? Carpal tunnel syndrome   ? Cervical disc disorder   ? Complication of anesthesia   ? Essential hypertension   ? Functional incontinence   ? Generalized anxiety disorder   ? Hyperlipidemia   ? Lower extremity numbness   ? Lumbar disc disease   ? Migraines   ? PONV (postoperative nausea and vomiting)   ? per pt, she usually gets zofran and scopalamine patch   ? Renal disorder   ? kidney stone  ? Seasonal allergies   ? Vitamin D deficiency    ? ? ?Social History  ? ?Socioeconomic History  ? Marital status: Married  ?  Spouse name: Not on file  ? Number of children: Not on file  ? Years of education: Not on file  ? Highest education level: Not on file  ?Occupational History  ? Not on file  ?Tobacco Use  ? Smoking status: Never  ? Smokeless tobacco: Never  ?Vaping Use  ? Vaping Use: Never used  ?Substance and Sexual Activity  ? Alcohol use: Yes  ?  Alcohol/week: 0.0 standard drinks  ? Drug use: No  ? Sexual activity: Not on file  ?Other Topics Concern  ? Not on file  ?Social History Narrative  ? Married.  ? 1 child. 1 grandchild.  ? Retired.   ? Enjoys reading.   ? ?Social Determinants of Health  ? ?Financial Resource Strain: Not on file  ?Food Insecurity: Not on file  ?Transportation Needs: Not on file  ?Physical Activity: Not on file  ?Stress: Not on file  ?Social Connections: Not on file  ?Intimate Partner Violence: Not on file  ? ? ?Past Surgical History:  ?Procedure Laterality Date  ? ABDOMINAL EXPOSURE N/A 11/16/2019  ? Procedure: ABDOMINAL EXPOSURE;  Surgeon: Marty Heck, MD;  Location: [facility];  Service: Vascular;  Laterality: N/A;  ? ABDOMINAL HYSTERECTOMY    ? ANTERIOR LUMBAR FUSION N/A 11/16/2019  ? Procedure: LUMBAR FIVE-SACRAL ONE ANTERIOR LUMBAR INTERBODY FUSION;  Surgeon: Eustace Moore, MD;  Location: [facility]  OR;  Service: Neurosurgery;  Laterality: N/A;  anterior approach  ? BLADDER SUSPENSION  2005  ? BREAST BIOPSY Bilateral yrs ago in [place]  ? high risk lesions, not cancer per pt  ? BREAST CYST EXCISION Bilateral yrs ago in [place]  ? hard to see some excision areas  ? CHOLECYSTECTOMY    ? TONSILLECTOMY    ? ? ?Family History  ?Problem Relation Age of Onset  ? Hyperlipidemia Mother   ? Breast cancer Mother 31  ? Ovarian cancer Maternal Aunt   ? Lung cancer Maternal Uncle   ? Arthritis Maternal Grandmother   ? Arthritis Maternal Grandfather   ? Colon cancer Maternal Grandfather   ? Arthritis Paternal Grandmother   ? Hypertension Paternal  Grandmother   ? Diabetes Paternal Grandmother   ? Arthritis Paternal Grandfather   ? Diabetes Paternal Grandfather   ? Arthritis Cousin   ? Breast cancer Cousin   ?     maternal side x2  ? ? ?Allergies  ?Allergen Reactions  ? Codeine Nausea And Vomiting  ? Iodine Other (See Comments)  ?  Redness around lips  ? Latex Rash  ? Lipitor [Atorvastatin] Rash  ? Shellfish Allergy Swelling and Rash  ? Sulfa Antibiotics Rash  ? ? ?Current Outpatient Medications on File Prior to Visit  ?Medication Sig Dispense Refill  ? acetaminophen (TYLENOL) 500 MG tablet Take 500 mg by mouth every 6 (six) hours as needed (for pain.).    ? amLODipine (NORVASC) 5 MG tablet TAKE 1 TABLET BY MOUTH DAILY FOR BLOOD PRESSURE 30 tablet 0  ? gabapentin (NEURONTIN) 300 MG capsule Take 300 mg by mouth in the morning and at bedtime.     ? ibuprofen (ADVIL) 200 MG tablet Take 800 mg by mouth every 6 (six) hours as needed for headache or moderate pain.    ? rosuvastatin (CRESTOR) 10 MG tablet TAKE 1 TABLET BY MOUTH DAILY FOR CHOLESTEROL DUE FOR OFFICE VISIT IN MARCH 2023 30 tablet 0  ? venlafaxine XR (EFFEXOR-XR) 150 MG 24 hr capsule TAKE 1 CAPSULE BY MOUTH DAILY  WITH BREAKFAST FOR ANXIETY AND  DEPRESSION, TAKE WITH 37.5 MG. 30 capsule 0  ? venlafaxine XR (EFFEXOR-XR) 37.5 MG 24 hr capsule TAKE 1 CAPSULE BY MOUTH DAILY  WITH BREAKFAST FOR DEPRESSION  AND ANXIETY, TAKE WITH 150 MG. 30 capsule 0  ? ISOtretinoin (ACCUTANE) 20 MG capsule Take 1 capsule (20 mg total) by mouth daily. (Patient not taking: Reported on 09/12/2021) 30 capsule 0  ? ?No current facility-administered medications on file prior to visit.  ? ? ?BP 126/82   Pulse 88   Temp 98.6 ?F (37 ?C) (Oral)   Ht 5\' 8"  (1.727 m)   Wt 206 lb (93.4 kg)   SpO2 98%   BMI 31.32 kg/m?  ?Objective:  ? Physical Exam ?HENT:  ?   Right Ear: Tympanic membrane and ear canal normal.  ?   Left Ear: Tympanic membrane and ear canal normal.  ?   Nose: Nose normal.  ?Eyes:  ?   Conjunctiva/sclera: Conjunctivae  normal.  ?   Pupils: Pupils are equal, round, and reactive to light.  ?Neck:  ?   Thyroid: No thyromegaly.  ?Cardiovascular:  ?   Rate and Rhythm: Normal rate and regular rhythm.  ?   Heart sounds: No murmur heard. ?Pulmonary:  ?   Effort: Pulmonary effort is normal.  ?   Breath sounds: Normal breath sounds. No rales.  ?Abdominal:  ?  General: Bowel sounds are normal.  ?   Palpations: Abdomen is soft.  ?   Tenderness: There is no abdominal tenderness.  ?Musculoskeletal:     ?   General: Normal range of motion.  ?   Cervical back: Neck supple.  ?Lymphadenopathy:  ?   Cervical: No cervical adenopathy.  ?Skin: ?   General: Skin is warm and dry.  ?   Findings: No rash.  ?Neurological:  ?   Mental Status: She is alert and oriented to person, place, and time.  ?   Cranial Nerves: No cranial nerve deficit.  ?   Deep Tendon Reflexes: Reflexes are normal and symmetric.  ?Psychiatric:     ?   Mood and Affect: Mood normal.  ? ? ? ? ? ?   ?Assessment & Plan:  ? ? ? ? ?This visit occurred during the SARS-CoV-2 public health emergency.  Safety protocols were in place, including screening questions prior to the visit, additional usage of staff PPE, and extensive cleaning of exam room while observing appropriate contact time as indicated for disinfecting solutions.  ?

## 2021-09-12 NOTE — Assessment & Plan Note (Addendum)
Controlled. ?No longer following with neurosurgery. ? ?Continue gabapentin 300 mg BID. ?Refills provided.  ?

## 2021-09-12 NOTE — Assessment & Plan Note (Signed)
Resolved

## 2021-09-12 NOTE — Assessment & Plan Note (Signed)
Immunizations up-to-date. ?Mammogram due, orders placed. ?Colon cancer screening due, patient opts for Cologuard which will be sent to her home. ? ?Discussed the importance of a healthy diet and regular exercise in order for weight loss, and to reduce the risk of further co-morbidity. ? ?Exam today stable. ?Labs pending. ?

## 2021-09-12 NOTE — Assessment & Plan Note (Signed)
Controlled.  Continue amlodipine 5 mg daily. 

## 2021-09-12 NOTE — Assessment & Plan Note (Signed)
Continue rosuvastatin 10 mg daily. ?Repeat lipid panel pending. ?

## 2021-09-12 NOTE — Assessment & Plan Note (Signed)
Intermittent, stable.  ?Continue to monitor.  ?

## 2021-09-12 NOTE — Patient Instructions (Signed)
Stop by the lab prior to leaving today. I will notify you of your results once received.  ? ?Complete the Cologuard kit once received. ? ?Call the [facility] to schedule your mammogram.  ? ?[facility] ? ?It was a pleasure to see you today! ? ?Preventive Care 25-62 Years Old, Female ?Preventive care refers to lifestyle choices and visits with your health care provider that can promote health and wellness. Preventive care visits are also called wellness exams. ?What can I expect for my preventive care visit? ?Counseling ?Your health care provider may ask you questions about your: ?Medical history, including: ?Past medical problems. ?Family medical history. ?Pregnancy history. ?Current health, including: ?Menstrual cycle. ?Method of birth control. ?Emotional well-being. ?Home life and relationship well-being. ?Sexual activity and sexual health. ?Lifestyle, including: ?Alcohol, nicotine or tobacco, and drug use. ?Access to firearms. ?Diet, exercise, and sleep habits. ?Work and work Statistician. ?Sunscreen use. ?Safety issues such as seatbelt and bike helmet use. ?Physical exam ?Your health care provider will check your: ?Height and weight. These may be used to calculate your BMI (body mass index). BMI is a measurement that tells if you are at a healthy weight. ?Waist circumference. This measures the distance around your waistline. This measurement also tells if you are at a healthy weight and may help predict your risk of certain diseases, such as type 2 diabetes and high blood pressure. ?Heart rate and blood pressure. ?Body temperature. ?Skin for abnormal spots. ?What immunizations do I need? ?Vaccines are usually given at various ages, according to a schedule. Your health care provider will recommend vaccines for you based on your age, medical history, and lifestyle or other factors, such as travel or where you work. ?What tests do I need? ?Screening ?Your health care provider may  recommend screening tests for certain conditions. This may include: ?Lipid and cholesterol levels. ?Diabetes screening. This is done by checking your blood sugar (glucose) after you have not eaten for a while (fasting). ?Pelvic exam and Pap test. ?Hepatitis B test. ?Hepatitis C test. ?HIV (human immunodeficiency virus) test. ?STI (sexually transmitted infection) testing, if you are at risk. ?Lung cancer screening. ?Colorectal cancer screening. ?Mammogram. Talk with your health care provider about when you should start having regular mammograms. This may depend on whether you have a family history of breast cancer. ?BRCA-related cancer screening. This may be done if you have a family history of breast, ovarian, tubal, or peritoneal cancers. ?Bone density scan. This is done to screen for osteoporosis. ?Talk with your health care provider about your test results, treatment options, and if necessary, the need for more tests. ?Follow these instructions at home: ?Eating and drinking ? ?Eat a diet that includes fresh fruits and vegetables, whole grains, lean protein, and low-fat dairy products. ?Take vitamin and mineral supplements as recommended by your health care provider. ?Do not drink alcohol if: ?Your health care provider tells you not to drink. ?You are pregnant, may be pregnant, or are planning to become pregnant. ?If you drink alcohol: ?Limit how much you have to 0-1 drink a day. ?Know how much alcohol is in your drink. In the [place] one drink equals one 12 oz bottle of beer (355 mL), one 5 oz glass of wine (148 mL), or one 1? oz glass of hard liquor (44 mL). ?Lifestyle ?Brush your teeth every morning and night with fluoride toothpaste. Floss one time each day. ?Exercise for at least 30 minutes 5 or more days each week. ?Do not  use any products that contain nicotine or tobacco. These products include cigarettes, chewing tobacco, and vaping devices, such as e-cigarettes. If you need help quitting, ask your health  care provider. ?Do not use drugs. ?If you are sexually active, practice safe sex. Use a condom or other form of protection to prevent STIs. ?If you do not wish to become pregnant, use a form of birth control. If you plan to become pregnant, see your health care provider for a prepregnancy visit. ?Take aspirin only as told by your health care provider. Make sure that you understand how much to take and what form to take. Work with your health care provider to find out whether it is safe and beneficial for you to take aspirin daily. ?Find healthy ways to manage stress, such as: ?Meditation, yoga, or listening to music. ?Journaling. ?Talking to a trusted person. ?Spending time with friends and family. ?Minimize exposure to UV radiation to reduce your risk of skin cancer. ?Safety ?Always wear your seat belt while driving or riding in a vehicle. ?Do not drive: ?If you have been drinking alcohol. Do not ride with someone who has been drinking. ?When you are tired or distracted. ?While texting. ?If you have been using any mind-altering substances or drugs. ?Wear a helmet and other protective equipment during sports activities. ?If you have firearms in your house, make sure you follow all gun safety procedures. ?Seek help if you have been physically or sexually abused. ?What's next? ?Visit your health care provider once a year for an annual wellness visit. ?Ask your health care provider how often you should have your eyes and teeth checked. ?Stay up to date on all vaccines. ?This information is not intended to replace advice given to you by your health care provider. Make sure you discuss any questions you have with your health care provider. ?Document Revised: 12/14/2020 Document Reviewed: 12/14/2020 ?Elsevier Patient Education ? [place]. ? ?

## 2021-09-12 NOTE — Assessment & Plan Note (Signed)
Discussed the importance of a healthy diet and regular exercise in order for weight loss, and to reduce the risk of further co-morbidity. ? ?Repeat A1C pending. ?

## 2021-10-09 ENCOUNTER — Ambulatory Visit: Payer: BLUE CROSS/BLUE SHIELD | Admitting: Dermatology

## 2021-10-09 VITALS — Wt 206.0 lb

## 2021-10-09 DIAGNOSIS — L700 Acne vulgaris: Secondary | ICD-10-CM | POA: Diagnosis not present

## 2021-10-09 DIAGNOSIS — L738 Other specified follicular disorders: Secondary | ICD-10-CM | POA: Diagnosis not present

## 2021-10-09 DIAGNOSIS — L821 Other seborrheic keratosis: Secondary | ICD-10-CM

## 2021-10-09 MED ORDER — ISOTRETINOIN 20 MG PO CAPS: 20.0000 mg | ORAL_CAPSULE | Freq: Every day | ORAL | 0 refills | Status: DC

## 2021-10-09 NOTE — Progress Notes (Signed)
? ?  Follow-Up Visit ?  ?Subjective  ?Elaine Young is a 61 y.o. female who presents for the following: Acne (And Sebaceous hyperplasia - Week #4 Isotretinoin 20 mg qd no side effects). ?She complains about spots on her chest would like them checked. ?The patient has spots, moles and lesions to be evaluated, some may be new or changing and the patient has concerns that these could be cancer. ? ?The following portions of the chart were reviewed this encounter and updated as appropriate:  ? Tobacco  Allergies  Meds  Problems  Med Hx  Surg Hx  Fam Hx   ?  ?Review of Systems:  No other skin or systemic complaints except as noted in HPI or Assessment and Plan. ? ?Objective  ?Well appearing patient in no apparent distress; mood and affect are within normal limits. ? ?A focused examination was performed including face. Relevant physical exam findings are noted in the Assessment and Plan. ? ? Isotretinoin F/U - 10/09/21 1700   ? ?  ? Isotretinoin Follow Up  ? iPledge # [id number]   ? Date 10/09/21   ? Weight 206 lb (93.4 kg)   ? Acne breakouts since last visit? Yes   ?  ? Dosage  ? Target Dosage (mg) 14010   ? Current (To Date) Dosage (mg) 600   ? To Go Dosage (mg) 13410   ?  ? Side Effects  ? Skin Chapped Lips;Dry Skin   ? Gastrointestinal WNL   ? Neurological WNL   ? Constitutional WNL   ? ?  ?  ? ?  ?  ?Face ?Decreased comedones ? ?Head - Anterior (Face) ?Decreased yellow papules ? ?Chest - Medial [facility]) ?Stuck-on, waxy, tan-brown papule or plaque --Discussed benign etiology and prognosis.  ? ? ?Assessment & Plan  ?Acne vulgaris ?Face ?Acne is Severe; chronic and persistent; not at goal. ?Patient is on Isotretinoin -  requiring FDA mandated monthly evaluations and laboratory monitoring. ? ?While taking isotretinoin, do not share pills and do not donate blood. Generic isotretinoin is best absorbed when taken with a fatty meal. Isotretinoin can make you sensitive to the sun. Daily careful sun protection  including sunscreen SPF 30+ when outdoors is recommended.  ? ?Continue Isotretinoin 20 mg 1 po qd ? ?Ipledge #[id number] ?[facility] ? ?ISOtretinoin (ACCUTANE) 20 MG capsule - Face ?Take 1 capsule (20 mg total) by mouth daily. ? ?Sebaceous hyperplasia ?Head - Anterior (Face) ?Improving  ?Continue Isotretinoin 20 mg 1 po qd ?Related medications ?ISOtretinoin (ACCUTANE) 20 MG capsule ?Take 1 capsule (20 mg total) by mouth daily. ? ?Seborrheic keratosis ?Chest - Medial [facility]) ?Benign-appearing.  Observation.  Call clinic for new or changing lesions.  Recommend daily use of broad spectrum spf 30+ sunscreen to sun-exposed areas.  ? ?Return in about 1 month (around 11/08/2021) for Isotretinoin. ? ?I, Joanie Coddington, CMA, am acting as scribe for Armida Sans, MD . ?Documentation: I have reviewed the above documentation for accuracy and completeness, and I agree with the above. ? ?Armida Sans, MD ? ?

## 2021-10-09 NOTE — Patient Instructions (Signed)
Acne is Severe; chronic and persistent; not at goal. °Patient is on Isotretinoin -  requiring FDA mandated monthly evaluations and laboratory monitoring. ° °While taking isotretinoin, do not share pills and do not donate blood. Generic isotretinoin is best absorbed when taken with a fatty meal. Isotretinoin can make you sensitive to the sun. Daily careful sun protection including sunscreen SPF 30+ when outdoors is recommended. ° ° °If You Need Anything After Your Visit ° °If you have any questions or concerns for your doctor, please call our main line at [phone] and press option 4 to reach your doctor's medical assistant. If no one answers, please leave a voicemail as directed and we will return your call as soon as possible. Messages left after 4 pm will be answered the following business day.  ° °You may also send us a message via MyChart. We typically respond to MyChart messages within 1-2 business days. ° °For prescription refills, please ask your pharmacy to contact our office. Our fax number is [phone]. ° °If you have an urgent issue when the clinic is closed that cannot wait until the next business day, you can page your doctor at the number below.   ° °Please note that while we do our best to be available for urgent issues outside of office hours, we are not available 24/7.  ° °If you have an urgent issue and are unable to reach us, you may choose to seek medical care at your doctor's office, retail clinic, urgent care center, or emergency room. ° °If you have a medical emergency, please immediately call 911 or go to the emergency department. ° °Pager Numbers ° °- Dr. Kowalski: [phone] ° °- Dr. Moye: [phone] ° °- Dr. Stewart: [phone] ° °In the event of inclement weather, please call our main line at [phone] for an update on the status of any delays or closures. ° °Dermatology Medication Tips: °Please keep the boxes that topical medications come in in order to help keep track of  the instructions about where and how to use these. Pharmacies typically print the medication instructions only on the boxes and not directly on the medication tubes.  ° °If your medication is too expensive, please contact our office at [phone] option 4 or send us a message through MyChart.  ° °We are unable to tell what your co-pay for medications will be in advance as this is different depending on your insurance coverage. However, we may be able to find a substitute medication at lower cost or fill out paperwork to get insurance to cover a needed medication.  ° °If a prior authorization is required to get your medication covered by your insurance company, please allow us 1-2 business days to complete this process. ° °Drug prices often vary depending on where the prescription is filled and some pharmacies may offer cheaper prices. ° °The website www.goodrx.com contains coupons for medications through different pharmacies. The prices here do not account for what the cost may be with help from insurance (it may be cheaper with your insurance), but the website can give you the price if you did not use any insurance.  °- You can print the associated coupon and take it with your prescription to the pharmacy.  °- You may also stop by our office during regular business hours and pick up a GoodRx coupon card.  °- If you need your prescription sent electronically to a different pharmacy, notify our office through [facility] MyChart or by phone at [phone] option   4. ° ° ° ° °Si Usted Necesita Algo Después de Su Visita ° °También puede enviarnos un mensaje a través de MyChart. Por lo general respondemos a los mensajes de MyChart en el transcurso de 1 a 2 días hábiles. ° °Para renovar recetas, por favor pida a su farmacia que se ponga en contacto con nuestra oficina. Nuestro número de fax es el [phone]. ° °Si tiene un asunto urgente cuando la clínica esté cerrada y que no puede esperar hasta el siguiente día  hábil, puede llamar/localizar a su doctor(a) al número que aparece a continuación.  ° °Por favor, tenga en cuenta que aunque hacemos todo lo posible para estar disponibles para asuntos urgentes fuera del horario de oficina, no estamos disponibles las 24 horas del día, los 7 días de la semana.  ° °Si tiene un problema urgente y no puede comunicarse con nosotros, puede optar por buscar atención médica  en el consultorio de su doctor(a), en una clínica privada, en un centro de atención urgente o en una sala de emergencias. ° °Si tiene una emergencia médica, por favor llame inmediatamente al 911 o vaya a la sala de emergencias. ° °Números de bíper ° °- Dr. Kowalski: [phone] ° °- Dra. Moye: [phone] ° °- Dra. Stewart: [phone] ° °En caso de inclemencias del tiempo, por favor llame a nuestra línea principal al [phone] para una actualización sobre el estado de cualquier retraso o cierre. ° °Consejos para la medicación en dermatología: °Por favor, guarde las cajas en las que vienen los medicamentos de uso tópico para ayudarle a seguir las instrucciones sobre dónde y cómo usarlos. Las farmacias generalmente imprimen las instrucciones del medicamento sólo en las cajas y no directamente en los tubos del medicamento.  ° °Si su medicamento es muy caro, por favor, póngase en contacto con nuestra oficina llamando al [phone] y presione la opción 4 o envíenos un mensaje a través de MyChart.  ° °No podemos decirle cuál será su copago por los medicamentos por adelantado ya que esto es diferente dependiendo de la cobertura de su seguro. Sin embargo, es posible que podamos encontrar un medicamento sustituto a menor costo o llenar un formulario para que el seguro cubra el medicamento que se considera necesario.  ° °Si se requiere una autorización previa para que su compañía de seguros cubra su medicamento, por favor permítanos de 1 a 2 días hábiles para completar este proceso. ° °Los precios de los medicamentos  varían con frecuencia dependiendo del lugar de dónde se surte la receta y alguna farmacias pueden ofrecer precios más baratos. ° °El sitio web www.goodrx.com tiene cupones para medicamentos de diferentes farmacias. Los precios aquí no tienen en cuenta lo que podría costar con la ayuda del seguro (puede ser más barato con su seguro), pero el sitio web puede darle el precio si no utilizó ningún seguro.  °- Puede imprimir el cupón correspondiente y llevarlo con su receta a la farmacia.  °- También puede pasar por nuestra oficina durante el horario de atención regular y recoger una tarjeta de cupones de GoodRx.  °- Si necesita que su receta se envíe electrónicamente a una farmacia diferente, informe a nuestra oficina a través de MyChart de [facility] o por teléfono llamando al [phone] y presione la opción 4. ° °

## 2021-10-10 ENCOUNTER — Encounter: Payer: Self-pay | Admitting: Dermatology

## 2021-10-20 ENCOUNTER — Ambulatory Visit: Payer: BLUE CROSS/BLUE SHIELD | Admitting: Primary Care

## 2021-11-01 ENCOUNTER — Telehealth: Payer: Self-pay

## 2021-11-01 NOTE — Telephone Encounter (Signed)
Patient left VM asking for accutane RX to be sent in early due to leave to go out of town after appt next week.  ? ?Left VM for patient to return my call. ? ?Was going to advise patient we can not send in RX early but we can move appointment to possibly Monday to see if that helps with RX getting to her in time before her trip.  ?

## 2021-11-08 ENCOUNTER — Ambulatory Visit: Payer: BLUE CROSS/BLUE SHIELD | Admitting: Dermatology

## 2021-11-08 VITALS — Wt 209.0 lb

## 2021-11-08 DIAGNOSIS — K130 Diseases of lips: Secondary | ICD-10-CM

## 2021-11-08 DIAGNOSIS — L738 Other specified follicular disorders: Secondary | ICD-10-CM

## 2021-11-08 DIAGNOSIS — Z79899 Other long term (current) drug therapy: Secondary | ICD-10-CM

## 2021-11-08 DIAGNOSIS — L853 Xerosis cutis: Secondary | ICD-10-CM | POA: Diagnosis not present

## 2021-11-08 DIAGNOSIS — L700 Acne vulgaris: Secondary | ICD-10-CM

## 2021-11-08 MED ORDER — ISOTRETINOIN 30 MG PO CAPS: 30.0000 mg | ORAL_CAPSULE | Freq: Every day | ORAL | 0 refills | Status: DC

## 2021-11-08 NOTE — Progress Notes (Signed)
   Isotretinoin Follow-Up Visit   Subjective  Elaine Young is a 61 y.o. female who presents for the following: Acne (And sebaceous hyperplasia - week #8 Isotretinoin 20 mg 1 po qd).  Week # 8   Isotretinoin F/U - 11/08/21 1600       Isotretinoin Follow Up   iPledge # [id number]    Date 11/08/21    Weight 209 lb (94.8 kg)    Acne breakouts since last visit? Yes      Dosage   Target Dosage (mg) 14010    Current (To Date) Dosage (mg) 1200    To Go Dosage (mg) 12810      Side Effects   Skin Chapped Lips    Gastrointestinal WNL    Neurological WNL    Constitutional WNL             Side effects: Dry skin, dry lips  Denies changes in night vision, shortness of breath, abdominal pain, nausea, vomiting, diarrhea, blood in stool or urine, visual changes, headaches, epistaxis, joint pain, myalgias, mood changes, depression, or suicidal ideation.   The following portions of the chart were reviewed this encounter and updated as appropriate: medications, allergies, medical history  Review of Systems:  No other skin or systemic complaints except as noted in HPI or Assessment and Plan.  Objective  Well appearing patient in no apparent distress; mood and affect are within normal limits.  An examination of the face, neck, chest, and back was performed and relevant findings are noted below.   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 Patient is on Isotretinoin -  requiring FDA mandated monthly evaluations and laboratory monitoring.   While taking isotretinoin, do not share pills and do not donate blood. Generic isotretinoin is best absorbed when taken with a fatty meal. Isotretinoin can make you sensitive to the sun. Daily careful sun protection including sunscreen SPF 30+ when outdoors is recommended.    Increase Isotretinoin 30 mg 1 po qd   Ipledge #[id number] [facility] [address]  ISOtretinoin (ACCUTANE) 30 MG capsule - Face Take 1 capsule (30 mg total) by mouth daily.  Sebaceous hyperplasia Face  Improving  Increase Isotretinoin 30 mg 1 po qd   Xerosis secondary to isotretinoin therapy - Continue emollients as directed  Cheilitis secondary to isotretinoin therapy - Continue lip balm as directed, Dr. Clayborne Artist Cortibalm recommended  Long term medication management (isotretinoin) - While taking Isotretinoin and for 30 days after you finish the medication, do not share pills, do not donate blood. Isotretinoin is best absorbed when taken with a fatty meal. Isotretinoin can make you sensitive to the sun. Daily careful sun protection including sunscreen SPF 30+ when outdoors is recommended.  Follow-up in 30 days.  I, Joanie Coddington, CMA, am acting as scribe for Armida Sans, MD . Documentation: I have reviewed the above documentation for accuracy and completeness, and I agree with the above.  Armida Sans, MD

## 2021-11-08 NOTE — Patient Instructions (Signed)
Acne is Severe; chronic and persistent; not at goal. °Patient is on Isotretinoin -  requiring FDA mandated monthly evaluations and laboratory monitoring. ° °While taking isotretinoin, do not share pills and do not donate blood. Generic isotretinoin is best absorbed when taken with a fatty meal. Isotretinoin can make you sensitive to the sun. Daily careful sun protection including sunscreen SPF 30+ when outdoors is recommended. ° ° °If You Need Anything After Your Visit ° °If you have any questions or concerns for your doctor, please call our main line at [phone] and press option 4 to reach your doctor's medical assistant. If no one answers, please leave a voicemail as directed and we will return your call as soon as possible. Messages left after 4 pm will be answered the following business day.  ° °You may also send us a message via MyChart. We typically respond to MyChart messages within 1-2 business days. ° °For prescription refills, please ask your pharmacy to contact our office. Our fax number is [phone]. ° °If you have an urgent issue when the clinic is closed that cannot wait until the next business day, you can page your doctor at the number below.   ° °Please note that while we do our best to be available for urgent issues outside of office hours, we are not available 24/7.  ° °If you have an urgent issue and are unable to reach us, you may choose to seek medical care at your doctor's office, retail clinic, urgent care center, or emergency room. ° °If you have a medical emergency, please immediately call 911 or go to the emergency department. ° °Pager Numbers ° °- Dr. Kowalski: [phone] ° °- Dr. Moye: [phone] ° °- Dr. Stewart: [phone] ° °In the event of inclement weather, please call our main line at [phone] for an update on the status of any delays or closures. ° °Dermatology Medication Tips: °Please keep the boxes that topical medications come in in order to help keep track of  the instructions about where and how to use these. Pharmacies typically print the medication instructions only on the boxes and not directly on the medication tubes.  ° °If your medication is too expensive, please contact our office at [phone] option 4 or send us a message through MyChart.  ° °We are unable to tell what your co-pay for medications will be in advance as this is different depending on your insurance coverage. However, we may be able to find a substitute medication at lower cost or fill out paperwork to get insurance to cover a needed medication.  ° °If a prior authorization is required to get your medication covered by your insurance company, please allow us 1-2 business days to complete this process. ° °Drug prices often vary depending on where the prescription is filled and some pharmacies may offer cheaper prices. ° °The website www.goodrx.com contains coupons for medications through different pharmacies. The prices here do not account for what the cost may be with help from insurance (it may be cheaper with your insurance), but the website can give you the price if you did not use any insurance.  °- You can print the associated coupon and take it with your prescription to the pharmacy.  °- You may also stop by our office during regular business hours and pick up a GoodRx coupon card.  °- If you need your prescription sent electronically to a different pharmacy, notify our office through [facility] MyChart or by phone at [phone] option   4. ° ° ° ° °Si Usted Necesita Algo Después de Su Visita ° °También puede enviarnos un mensaje a través de MyChart. Por lo general respondemos a los mensajes de MyChart en el transcurso de 1 a 2 días hábiles. ° °Para renovar recetas, por favor pida a su farmacia que se ponga en contacto con nuestra oficina. Nuestro número de fax es el [phone]. ° °Si tiene un asunto urgente cuando la clínica esté cerrada y que no puede esperar hasta el siguiente día  hábil, puede llamar/localizar a su doctor(a) al número que aparece a continuación.  ° °Por favor, tenga en cuenta que aunque hacemos todo lo posible para estar disponibles para asuntos urgentes fuera del horario de oficina, no estamos disponibles las 24 horas del día, los 7 días de la semana.  ° °Si tiene un problema urgente y no puede comunicarse con nosotros, puede optar por buscar atención médica  en el consultorio de su doctor(a), en una clínica privada, en un centro de atención urgente o en una sala de emergencias. ° °Si tiene una emergencia médica, por favor llame inmediatamente al 911 o vaya a la sala de emergencias. ° °Números de bíper ° °- Dr. Kowalski: [phone] ° °- Dra. Moye: [phone] ° °- Dra. Stewart: [phone] ° °En caso de inclemencias del tiempo, por favor llame a nuestra línea principal al [phone] para una actualización sobre el estado de cualquier retraso o cierre. ° °Consejos para la medicación en dermatología: °Por favor, guarde las cajas en las que vienen los medicamentos de uso tópico para ayudarle a seguir las instrucciones sobre dónde y cómo usarlos. Las farmacias generalmente imprimen las instrucciones del medicamento sólo en las cajas y no directamente en los tubos del medicamento.  ° °Si su medicamento es muy caro, por favor, póngase en contacto con nuestra oficina llamando al [phone] y presione la opción 4 o envíenos un mensaje a través de MyChart.  ° °No podemos decirle cuál será su copago por los medicamentos por adelantado ya que esto es diferente dependiendo de la cobertura de su seguro. Sin embargo, es posible que podamos encontrar un medicamento sustituto a menor costo o llenar un formulario para que el seguro cubra el medicamento que se considera necesario.  ° °Si se requiere una autorización previa para que su compañía de seguros cubra su medicamento, por favor permítanos de 1 a 2 días hábiles para completar este proceso. ° °Los precios de los medicamentos  varían con frecuencia dependiendo del lugar de dónde se surte la receta y alguna farmacias pueden ofrecer precios más baratos. ° °El sitio web www.goodrx.com tiene cupones para medicamentos de diferentes farmacias. Los precios aquí no tienen en cuenta lo que podría costar con la ayuda del seguro (puede ser más barato con su seguro), pero el sitio web puede darle el precio si no utilizó ningún seguro.  °- Puede imprimir el cupón correspondiente y llevarlo con su receta a la farmacia.  °- También puede pasar por nuestra oficina durante el horario de atención regular y recoger una tarjeta de cupones de GoodRx.  °- Si necesita que su receta se envíe electrónicamente a una farmacia diferente, informe a nuestra oficina a través de MyChart de [facility] o por teléfono llamando al [phone] y presione la opción 4. ° °

## 2021-11-24 ENCOUNTER — Encounter: Payer: Self-pay | Admitting: Dermatology

## 2021-12-14 ENCOUNTER — Ambulatory Visit: Payer: BLUE CROSS/BLUE SHIELD | Admitting: Dermatology

## 2021-12-14 VITALS — Wt 209.0 lb

## 2021-12-14 DIAGNOSIS — L700 Acne vulgaris: Secondary | ICD-10-CM | POA: Diagnosis not present

## 2021-12-14 DIAGNOSIS — L738 Other specified follicular disorders: Secondary | ICD-10-CM

## 2021-12-14 DIAGNOSIS — K130 Diseases of lips: Secondary | ICD-10-CM | POA: Diagnosis not present

## 2021-12-14 DIAGNOSIS — L853 Xerosis cutis: Secondary | ICD-10-CM | POA: Diagnosis not present

## 2021-12-14 DIAGNOSIS — Z79899 Other long term (current) drug therapy: Secondary | ICD-10-CM

## 2021-12-14 MED ORDER — ISOTRETINOIN 30 MG PO CAPS: 30.0000 mg | ORAL_CAPSULE | Freq: Every day | ORAL | 0 refills | Status: DC

## 2021-12-14 NOTE — Progress Notes (Unsigned)
   Isotretinoin Follow-Up Visit   Subjective  Elaine Young is a 61 y.o. female who presents for the following: Acne (Week #12 - Isotretinoin 30 mg).  Week # 12  Isotretinoin F/U - 12/14/21 1600       Isotretinoin Follow Up   iPledge # [id number]    Date 12/14/21    Weight 209 lb (94.8 kg)    Acne breakouts since last visit? No      Dosage   Target Dosage (mg) 14010    Current (To Date) Dosage (mg) 2100    To Go Dosage (mg) 11910      Side Effects   Skin Chapped Lips    Gastrointestinal WNL    Neurological WNL    Constitutional WNL            Side effects: Dry skin, dry lips  Denies changes in night vision, shortness of breath, abdominal pain, nausea, vomiting, diarrhea, blood in stool or urine, visual changes, headaches, epistaxis, joint pain, myalgias, mood changes, depression, or suicidal ideation.   The following portions of the chart were reviewed this encounter and updated as appropriate: medications, allergies, medical history  Review of Systems:  No other skin or systemic complaints except as noted in HPI or Assessment and Plan.  Objective  Well appearing patient in no apparent distress; mood and affect are within normal limits.  An examination of the face, neck, chest, and back was performed and relevant findings are noted below.    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 Patient is on Isotretinoin -  requiring FDA mandated monthly evaluations and laboratory monitoring.   While taking isotretinoin, do not share pills and do not donate blood. Generic isotretinoin is best absorbed when taken with a fatty meal. Isotretinoin can make you sensitive to the sun. Daily careful sun protection including sunscreen SPF 30+ when outdoors is recommended.   Continue Isotretinoin 30 mg 1 po qd   Ipledge #[id number] [facility]    ISOtretinoin (ACCUTANE) 30 MG capsule - Face Take 1  capsule (30 mg total) by mouth daily.  Sebaceous hyperplasia Face  Improving. Continue Isotretinoin as prescribed   Xerosis secondary to isotretinoin therapy - Continue emollients as directed  Cheilitis secondary to isotretinoin therapy - Continue lip balm as directed, Dr. Clayborne Artist Cortibalm recommended  Long term medication management (isotretinoin) - While taking Isotretinoin and for 30 days after you finish the medication, do not share pills, do not donate blood. Isotretinoin is best absorbed when taken with a fatty meal. Isotretinoin can make you sensitive to the sun. Daily careful sun protection including sunscreen SPF 30+ when outdoors is recommended.  Follow-up in 30 days.  I, Joanie Coddington, CMA, am acting as scribe for Armida Sans, MD .  Documentation: I have reviewed the above documentation for accuracy and completeness, and I agree with the above.  Armida Sans, MD

## 2021-12-19 ENCOUNTER — Encounter: Payer: Self-pay | Admitting: Dermatology

## 2022-01-17 ENCOUNTER — Ambulatory Visit: Payer: BLUE CROSS/BLUE SHIELD | Admitting: Dermatology

## 2022-01-17 VITALS — Wt 209.0 lb

## 2022-01-17 DIAGNOSIS — Z79899 Other long term (current) drug therapy: Secondary | ICD-10-CM

## 2022-01-17 DIAGNOSIS — K130 Diseases of lips: Secondary | ICD-10-CM

## 2022-01-17 DIAGNOSIS — L700 Acne vulgaris: Secondary | ICD-10-CM | POA: Diagnosis not present

## 2022-01-17 DIAGNOSIS — L853 Xerosis cutis: Secondary | ICD-10-CM | POA: Diagnosis not present

## 2022-01-17 MED ORDER — ISOTRETINOIN 40 MG PO CAPS: 40.0000 mg | ORAL_CAPSULE | Freq: Every day | ORAL | 0 refills | Status: AC

## 2022-01-17 NOTE — Patient Instructions (Signed)
Due to recent changes in healthcare laws, you may see results of your pathology and/or laboratory studies on MyChart before the doctors have had a chance to review them. We understand that in some cases there may be results that are confusing or concerning to you. Please understand that not all results are received at the same time and often the doctors may need to interpret multiple results in order to provide you with the best plan of care or course of treatment. Therefore, we ask that you please give us 2 business days to thoroughly review all your results before contacting the office for clarification. Should we see a critical lab result, you will be contacted sooner.   If You Need Anything After Your Visit  If you have any questions or concerns for your doctor, please call our main line at [phone] and press option 4 to reach your doctor's medical assistant. If no one answers, please leave a voicemail as directed and we will return your call as soon as possible. Messages left after 4 pm will be answered the following business day.   You may also send us a message via MyChart. We typically respond to MyChart messages within 1-2 business days.  For prescription refills, please ask your pharmacy to contact our office. Our fax number is [phone].  If you have an urgent issue when the clinic is closed that cannot wait until the next business day, you can page your doctor at the number below.    Please note that while we do our best to be available for urgent issues outside of office hours, we are not available 24/7.   If you have an urgent issue and are unable to reach us, you may choose to seek medical care at your doctor's office, retail clinic, urgent care center, or emergency room.  If you have a medical emergency, please immediately call 911 or go to the emergency department.  Pager Numbers  - Dr. Kowalski: [phone]  - Dr. Moye: [phone]  - Dr. Stewart:  [phone]  In the event of inclement weather, please call our main line at [phone] for an update on the status of any delays or closures.  Dermatology Medication Tips: Please keep the boxes that topical medications come in in order to help keep track of the instructions about where and how to use these. Pharmacies typically print the medication instructions only on the boxes and not directly on the medication tubes.   If your medication is too expensive, please contact our office at [phone] option 4 or send us a message through MyChart.   We are unable to tell what your co-pay for medications will be in advance as this is different depending on your insurance coverage. However, we may be able to find a substitute medication at lower cost or fill out paperwork to get insurance to cover a needed medication.   If a prior authorization is required to get your medication covered by your insurance company, please allow us 1-2 business days to complete this process.  Drug prices often vary depending on where the prescription is filled and some pharmacies may offer cheaper prices.  The website www.goodrx.com contains coupons for medications through different pharmacies. The prices here do not account for what the cost may be with help from insurance (it may be cheaper with your insurance), but the website can give you the price if you did not use any insurance.  - You can print the associated coupon and take it with   your prescription to the pharmacy.  - You may also stop by our office during regular business hours and pick up a GoodRx coupon card.  - If you need your prescription sent electronically to a different pharmacy, notify our office through [facility] MyChart or by phone at [phone] option 4.     Si Usted Necesita Algo Despus de Su Visita  Tambin puede enviarnos un mensaje a travs de MyChart. Por lo general respondemos a los mensajes de MyChart en el transcurso de 1 a 2  das hbiles.  Para renovar recetas, por favor pida a su farmacia que se ponga en contacto con nuestra oficina. Nuestro nmero de fax es el [phone].  Si tiene un asunto urgente cuando la clnica est cerrada y que no puede esperar hasta el siguiente da hbil, puede llamar/localizar a su doctor(a) al nmero que aparece a continuacin.   Por favor, tenga en cuenta que aunque hacemos todo lo posible para estar disponibles para asuntos urgentes fuera del horario de oficina, no estamos disponibles las 24 horas del da, los 7 das de la semana.   Si tiene un problema urgente y no puede comunicarse con nosotros, puede optar por buscar atencin mdica  en el consultorio de su doctor(a), en una clnica privada, en un centro de atencin urgente o en una sala de emergencias.  Si tiene una emergencia mdica, por favor llame inmediatamente al 911 o vaya a la sala de emergencias.  Nmeros de bper  - Dr. Kowalski: [phone]  - Dra. Moye: [phone]  - Dra. Stewart: [phone]  En caso de inclemencias del tiempo, por favor llame a nuestra lnea principal al [phone] para una actualizacin sobre el estado de cualquier retraso o cierre.  Consejos para la medicacin en dermatologa: Por favor, guarde las cajas en las que vienen los medicamentos de uso tpico para ayudarle a seguir las instrucciones sobre dnde y cmo usarlos. Las farmacias generalmente imprimen las instrucciones del medicamento slo en las cajas y no directamente en los tubos del medicamento.   Si su medicamento es muy caro, por favor, pngase en contacto con nuestra oficina llamando al [phone] y presione la opcin 4 o envenos un mensaje a travs de MyChart.   No podemos decirle cul ser su copago por los medicamentos por adelantado ya que esto es diferente dependiendo de la cobertura de su seguro. Sin embargo, es posible que podamos encontrar un medicamento sustituto a menor costo o llenar un formulario para que el  seguro cubra el medicamento que se considera necesario.   Si se requiere una autorizacin previa para que su compaa de seguros cubra su medicamento, por favor permtanos de 1 a 2 das hbiles para completar este proceso.  Los precios de los medicamentos varan con frecuencia dependiendo del lugar de dnde se surte la receta y alguna farmacias pueden ofrecer precios ms baratos.  El sitio web www.goodrx.com tiene cupones para medicamentos de diferentes farmacias. Los precios aqu no tienen en cuenta lo que podra costar con la ayuda del seguro (puede ser ms barato con su seguro), pero el sitio web puede darle el precio si no utiliz ningn seguro.  - Puede imprimir el cupn correspondiente y llevarlo con su receta a la farmacia.  - Tambin puede pasar por nuestra oficina durante el horario de atencin regular y recoger una tarjeta de cupones de GoodRx.  - Si necesita que su receta se enve electrnicamente a una farmacia diferente, informe a nuestra oficina a travs de MyChart de [facility]   o por telfono llamando al [phone] y presione la opcin 4.  

## 2022-01-17 NOTE — Progress Notes (Signed)
   Isotretinoin Follow-Up Visit   Subjective  Elaine Young is a 61 y.o. female who presents for the following: Acne (With sebaceous hyperplasia - currently on Isotretinoin 30mg  po QD pt c/o xerosis, cheilitis, and a few nose bleeds).  Week # 16   Isotretinoin F/U - 01/17/22 1600       Isotretinoin Follow Up   iPledge # 01/19/22    Date 01/17/22    Weight 209 lb (94.8 kg)    Acne breakouts since last visit? Yes      Dosage   Target Dosage (mg) 14010    Current (To Date) Dosage (mg) 3900    To Go Dosage (mg) 10110      Side Effects   Skin Chapped Lips;Dry Skin;Nosebleed    Gastrointestinal WNL    Neurological WNL    Constitutional WNL            Side effects: Dry skin, dry lips  Denies changes in night vision, shortness of breath, abdominal pain, nausea, vomiting, diarrhea, blood in stool or urine, visual changes, headaches, epistaxis, joint pain, myalgias, mood changes, depression, or suicidal ideation.   Patient is not pregnant, not seeking pregnancy, and not breastfeeding.   The following portions of the chart were reviewed this encounter and updated as appropriate: medications, allergies, medical history  Review of Systems:  No other skin or systemic complaints except as noted in HPI or Assessment and Plan.  Objective  Well appearing patient in no apparent distress; mood and affect are within normal limits.  An examination of the face, neck, chest, and back was performed and relevant findings are noted below.    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 Patient is on Isotretinoin -  requiring FDA mandated monthly evaluations and laboratory monitoring.  While taking isotretinoin, do not share pills and do not donate blood. Generic isotretinoin is best absorbed when taken with a fatty meal. Isotretinoin can make you sensitive to the sun. Daily careful sun protection  including sunscreen SPF 30+ when outdoors is recommended.  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   Total dose to date: 2,100mg   Total mg/kd dose: 22.2 mg/kg  Ipledge 01/19/22 [facility]  Increase Isotretinoin to 40mg  po QD.   ISOtretinoin (ACCUTANE) 40 MG capsule - Face Take 1 capsule (40 mg total) by mouth daily.  Xerosis secondary to isotretinoin therapy - Continue emollients as directed  Cheilitis secondary to isotretinoin therapy - Continue lip balm as directed, Dr. #[id number] Cortibalm recommended  Long term medication management (isotretinoin) - While taking Isotretinoin and for 30 days after you finish the medication, do not get pregnant, do not share pills, do not donate blood. Isotretinoin is best absorbed when taken with a fatty meal. Isotretinoin can make you sensitive to the sun. Daily careful sun protection including sunscreen SPF 30+ when outdoors is recommended.  Follow-up in 30 days.  , CMA, am acting as scribe for Clayborne Artist, MD . Documentation: I have reviewed the above documentation for accuracy and completeness, and I agree with the above.  Maylene Roes, MD

## 2022-01-27 ENCOUNTER — Encounter: Payer: Self-pay | Admitting: Dermatology

## 2022-02-11 ENCOUNTER — Telehealth: Payer: BLUE CROSS/BLUE SHIELD | Admitting: Nurse Practitioner

## 2022-02-11 DIAGNOSIS — R197 Diarrhea, unspecified: Secondary | ICD-10-CM | POA: Diagnosis not present

## 2022-02-11 MED ORDER — AZITHROMYCIN 500 MG PO TABS: 500.0000 mg | ORAL_TABLET | Freq: Every day | ORAL | 0 refills | Status: AC

## 2022-02-11 NOTE — Progress Notes (Signed)
We are sorry that you are not feeling well.  Here is how we plan to help!  Based on what you have shared with me it looks like you have Acute Infectious Diarrhea.  Most cases of acute diarrhea are due to infections with virus and bacteria and are self-limited conditions lasting less than 14 days.  For your symptoms you may take Imodium 2 mg tablets that are over the counter at your local pharmacy. Take two tablet now and then one after each loose stool up to 6 a day.  Antibiotics are not needed for most people with diarrhea.  Optional: I have prescribed azithromycin 500 mg daily for 3 days  HOME CARE We recommend changing your diet to help with your symptoms for the next few days. Drink plenty of fluids that contain water salt and sugar. Sports drinks such as Gatorade may help.  You may try broths, soups, bananas, applesauce, soft breads, mashed potatoes or crackers.  You are considered infectious for as long as the diarrhea continues. Hand washing or use of alcohol based hand sanitizers is recommend. It is best to stay out of work or school until your symptoms stop.   GET HELP RIGHT AWAY If you have dark yellow colored urine or do not pass urine frequently you should drink more fluids.   If your symptoms worsen  If you feel like you are going to pass out (faint) You have a new problem  MAKE SURE YOU  Understand these instructions. Will watch your condition. Will get help right away if you are not doing well or get worse.  Thank you for choosing an e-visit.  Your e-visit answers were reviewed by a board certified advanced clinical practitioner to complete your personal care plan. Depending upon the condition, your plan could have included both over the counter or prescription medications.  Please review your pharmacy choice. Make sure the pharmacy is open so you can pick up prescription now. If there is a problem, you may contact your provider through [employer] and have the  prescription routed to another pharmacy.  Your safety is important to [place]. If you have drug allergies check your prescription carefully.   For the next 24 hours you can use MyChart to ask questions about today's visit, request a non-urgent call back, or ask for a work or school excuse. You will get an email in the next two days asking about your experience. I hope that your e-visit has been valuable and will speed your recovery.   I have spent at least 5 minutes reviewing and documenting in the patient's chart.

## 2022-02-21 ENCOUNTER — Ambulatory Visit: Payer: BLUE CROSS/BLUE SHIELD | Admitting: Dermatology

## 2022-02-21 VITALS — Wt 209.0 lb

## 2022-02-21 DIAGNOSIS — L853 Xerosis cutis: Secondary | ICD-10-CM | POA: Diagnosis not present

## 2022-02-21 DIAGNOSIS — Z79899 Other long term (current) drug therapy: Secondary | ICD-10-CM

## 2022-02-21 DIAGNOSIS — L700 Acne vulgaris: Secondary | ICD-10-CM | POA: Diagnosis not present

## 2022-02-21 DIAGNOSIS — K130 Diseases of lips: Secondary | ICD-10-CM

## 2022-02-21 MED ORDER — ISOTRETINOIN 40 MG PO CAPS: 40.0000 mg | ORAL_CAPSULE | Freq: Every day | ORAL | 0 refills | Status: AC

## 2022-02-21 NOTE — Progress Notes (Signed)
   Isotretinoin Follow-Up Visit   Subjective  Elaine Young is a 61 y.o. female who presents for the following: Acne (Isotretinoin 40mg  po QD - pt c/o nose bleeds, but states that is normal for her this time of the year. No s/e of mood changes/depression).  Week # 20   Isotretinoin F/U - 02/21/22 1200       Isotretinoin Follow Up   iPledge # 02/23/22    Date 02/21/22    Weight 209 lb (94.8 kg)    Acne breakouts since last visit? No      Dosage   Target Dosage (mg) 14010    Current (To Date) Dosage (mg) 4200    To Go Dosage (mg) 9810      Side Effects   Skin Nosebleed    Gastrointestinal WNL    Neurological WNL    Constitutional WNL            Side effects: Dry skin, dry lips  Denies changes in night vision, shortness of breath, abdominal pain, nausea, vomiting, diarrhea, blood in stool or urine, visual changes, headaches, epistaxis, joint pain, myalgias, mood changes, depression, or suicidal ideation.   The following portions of the chart were reviewed this encounter and updated as appropriate: medications, allergies, medical history  Review of Systems:  No other skin or systemic complaints except as noted in HPI or Assessment and Plan.  Objective  Well appearing patient in no apparent distress; mood and affect are within normal limits.  An examination of the face, neck, chest, and back was performed and relevant findings are noted below.   Face Clear.    Assessment & Plan   Acne vulgaris Face  With sebaceous hyperplasia -   Acne is Severe; chronic and persistent; not at goal. Patient is on Isotretinoin -  requiring FDA mandated monthly evaluations and laboratory monitoring.  While taking isotretinoin, do not share pills and do not donate blood. Generic isotretinoin is best absorbed when taken with a fatty meal. Isotretinoin can make you sensitive to the sun. Daily careful sun protection including sunscreen SPF 30+ when outdoors is recommended.    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    Continue Isotretinoin 40mg  po QD.  Total dose to date: 4200mg   Total mg/kd dose: 44.3mg /kg   Ipledge 02/23/22 [facility]  Plan 2-3 more months of treatment.   ISOtretinoin (ACCUTANE) 40 MG capsule - Face Take 1 capsule (40 mg total) by mouth daily.   Xerosis secondary to isotretinoin therapy - Continue emollients as directed  Cheilitis secondary to isotretinoin therapy - Continue lip balm as directed, Dr. Cortibalm recommended  Long term medication management (isotretinoin) - While taking Isotretinoin and for 30 days after you finish the medication, do not share pills, do not donate blood. Isotretinoin is best absorbed when taken with a fatty meal. Isotretinoin can make you sensitive to the sun. Daily careful sun protection including sunscreen SPF 30+ when outdoors is recommended.  Follow-up in 30 days.  , CMA, am acting as scribe for #[id number], MD . Documentation: I have reviewed the above documentation for accuracy and completeness, and I agree with the above.  Clayborne Artist, MD

## 2022-02-21 NOTE — Patient Instructions (Signed)
Due to recent changes in healthcare laws, you may see results of your pathology and/or laboratory studies on MyChart before the doctors have had a chance to review them. We understand that in some cases there may be results that are confusing or concerning to you. Please understand that not all results are received at the same time and often the doctors may need to interpret multiple results in order to provide you with the best plan of care or course of treatment. Therefore, we ask that you please give us 2 business days to thoroughly review all your results before contacting the office for clarification. Should we see a critical lab result, you will be contacted sooner.   If You Need Anything After Your Visit  If you have any questions or concerns for your doctor, please call our main line at [phone] and press option 4 to reach your doctor's medical assistant. If no one answers, please leave a voicemail as directed and we will return your call as soon as possible. Messages left after 4 pm will be answered the following business day.   You may also send us a message via MyChart. We typically respond to MyChart messages within 1-2 business days.  For prescription refills, please ask your pharmacy to contact our office. Our fax number is [phone].  If you have an urgent issue when the clinic is closed that cannot wait until the next business day, you can page your doctor at the number below.    Please note that while we do our best to be available for urgent issues outside of office hours, we are not available 24/7.   If you have an urgent issue and are unable to reach us, you may choose to seek medical care at your doctor's office, retail clinic, urgent care center, or emergency room.  If you have a medical emergency, please immediately call 911 or go to the emergency department.  Pager Numbers  - Dr. Kowalski: [phone]  - Dr. Moye: [phone]  - Dr. Stewart:  [phone]  In the event of inclement weather, please call our main line at [phone] for an update on the status of any delays or closures.  Dermatology Medication Tips: Please keep the boxes that topical medications come in in order to help keep track of the instructions about where and how to use these. Pharmacies typically print the medication instructions only on the boxes and not directly on the medication tubes.   If your medication is too expensive, please contact our office at [phone] option 4 or send us a message through MyChart.   We are unable to tell what your co-pay for medications will be in advance as this is different depending on your insurance coverage. However, we may be able to find a substitute medication at lower cost or fill out paperwork to get insurance to cover a needed medication.   If a prior authorization is required to get your medication covered by your insurance company, please allow us 1-2 business days to complete this process.  Drug prices often vary depending on where the prescription is filled and some pharmacies may offer cheaper prices.  The website www.goodrx.com contains coupons for medications through different pharmacies. The prices here do not account for what the cost may be with help from insurance (it may be cheaper with your insurance), but the website can give you the price if you did not use any insurance.  - You can print the associated coupon and take it with   your prescription to the pharmacy.  - You may also stop by our office during regular business hours and pick up a GoodRx coupon card.  - If you need your prescription sent electronically to a different pharmacy, notify our office through [facility] MyChart or by phone at [phone] option 4.     Si Usted Necesita Algo Despus de Su Visita  Tambin puede enviarnos un mensaje a travs de MyChart. Por lo general respondemos a los mensajes de MyChart en el transcurso de 1 a 2  das hbiles.  Para renovar recetas, por favor pida a su farmacia que se ponga en contacto con nuestra oficina. Nuestro nmero de fax es el [phone].  Si tiene un asunto urgente cuando la clnica est cerrada y que no puede esperar hasta el siguiente da hbil, puede llamar/localizar a su doctor(a) al nmero que aparece a continuacin.   Por favor, tenga en cuenta que aunque hacemos todo lo posible para estar disponibles para asuntos urgentes fuera del horario de oficina, no estamos disponibles las 24 horas del da, los 7 das de la semana.   Si tiene un problema urgente y no puede comunicarse con nosotros, puede optar por buscar atencin mdica  en el consultorio de su doctor(a), en una clnica privada, en un centro de atencin urgente o en una sala de emergencias.  Si tiene una emergencia mdica, por favor llame inmediatamente al 911 o vaya a la sala de emergencias.  Nmeros de bper  - Dr. Kowalski: [phone]  - Dra. Moye: [phone]  - Dra. Stewart: [phone]  En caso de inclemencias del tiempo, por favor llame a nuestra lnea principal al [phone] para una actualizacin sobre el estado de cualquier retraso o cierre.  Consejos para la medicacin en dermatologa: Por favor, guarde las cajas en las que vienen los medicamentos de uso tpico para ayudarle a seguir las instrucciones sobre dnde y cmo usarlos. Las farmacias generalmente imprimen las instrucciones del medicamento slo en las cajas y no directamente en los tubos del medicamento.   Si su medicamento es muy caro, por favor, pngase en contacto con nuestra oficina llamando al [phone] y presione la opcin 4 o envenos un mensaje a travs de MyChart.   No podemos decirle cul ser su copago por los medicamentos por adelantado ya que esto es diferente dependiendo de la cobertura de su seguro. Sin embargo, es posible que podamos encontrar un medicamento sustituto a menor costo o llenar un formulario para que el  seguro cubra el medicamento que se considera necesario.   Si se requiere una autorizacin previa para que su compaa de seguros cubra su medicamento, por favor permtanos de 1 a 2 das hbiles para completar este proceso.  Los precios de los medicamentos varan con frecuencia dependiendo del lugar de dnde se surte la receta y alguna farmacias pueden ofrecer precios ms baratos.  El sitio web www.goodrx.com tiene cupones para medicamentos de diferentes farmacias. Los precios aqu no tienen en cuenta lo que podra costar con la ayuda del seguro (puede ser ms barato con su seguro), pero el sitio web puede darle el precio si no utiliz ningn seguro.  - Puede imprimir el cupn correspondiente y llevarlo con su receta a la farmacia.  - Tambin puede pasar por nuestra oficina durante el horario de atencin regular y recoger una tarjeta de cupones de GoodRx.  - Si necesita que su receta se enve electrnicamente a una farmacia diferente, informe a nuestra oficina a travs de MyChart de [facility]   o por telfono llamando al [phone] y presione la opcin 4.  

## 2022-02-23 ENCOUNTER — Encounter: Payer: Self-pay | Admitting: Dermatology

## 2022-03-28 ENCOUNTER — Ambulatory Visit: Payer: BLUE CROSS/BLUE SHIELD | Admitting: Dermatology

## 2022-03-28 VITALS — Wt 209.0 lb

## 2022-03-28 DIAGNOSIS — K130 Diseases of lips: Secondary | ICD-10-CM

## 2022-03-28 DIAGNOSIS — L738 Other specified follicular disorders: Secondary | ICD-10-CM

## 2022-03-28 DIAGNOSIS — Z79899 Other long term (current) drug therapy: Secondary | ICD-10-CM

## 2022-03-28 DIAGNOSIS — L700 Acne vulgaris: Secondary | ICD-10-CM | POA: Diagnosis not present

## 2022-03-28 DIAGNOSIS — L858 Other specified epidermal thickening: Secondary | ICD-10-CM | POA: Diagnosis not present

## 2022-03-28 MED ORDER — ISOTRETINOIN 40 MG PO CAPS: 40.0000 mg | ORAL_CAPSULE | Freq: Every day | ORAL | 0 refills | Status: AC

## 2022-03-28 NOTE — Patient Instructions (Signed)
Due to recent changes in healthcare laws, you may see results of your pathology and/or laboratory studies on MyChart before the doctors have had a chance to review them. We understand that in some cases there may be results that are confusing or concerning to you. Please understand that not all results are received at the same time and often the doctors may need to interpret multiple results in order to provide you with the best plan of care or course of treatment. Therefore, we ask that you please give us 2 business days to thoroughly review all your results before contacting the office for clarification. Should we see a critical lab result, you will be contacted sooner.   If You Need Anything After Your Visit  If you have any questions or concerns for your doctor, please call our main line at [phone] and press option 4 to reach your doctor's medical assistant. If no one answers, please leave a voicemail as directed and we will return your call as soon as possible. Messages left after 4 pm will be answered the following business day.   You may also send us a message via MyChart. We typically respond to MyChart messages within 1-2 business days.  For prescription refills, please ask your pharmacy to contact our office. Our fax number is [phone].  If you have an urgent issue when the clinic is closed that cannot wait until the next business day, you can page your doctor at the number below.    Please note that while we do our best to be available for urgent issues outside of office hours, we are not available 24/7.   If you have an urgent issue and are unable to reach us, you may choose to seek medical care at your doctor's office, retail clinic, urgent care center, or emergency room.  If you have a medical emergency, please immediately call 911 or go to the emergency department.  Pager Numbers  - Dr. Kowalski: [phone]  - Dr. Moye: [phone]  - Dr. Stewart:  [phone]  In the event of inclement weather, please call our main line at [phone] for an update on the status of any delays or closures.  Dermatology Medication Tips: Please keep the boxes that topical medications come in in order to help keep track of the instructions about where and how to use these. Pharmacies typically print the medication instructions only on the boxes and not directly on the medication tubes.   If your medication is too expensive, please contact our office at [phone] option 4 or send us a message through MyChart.   We are unable to tell what your co-pay for medications will be in advance as this is different depending on your insurance coverage. However, we may be able to find a substitute medication at lower cost or fill out paperwork to get insurance to cover a needed medication.   If a prior authorization is required to get your medication covered by your insurance company, please allow us 1-2 business days to complete this process.  Drug prices often vary depending on where the prescription is filled and some pharmacies may offer cheaper prices.  The website www.goodrx.com contains coupons for medications through different pharmacies. The prices here do not account for what the cost may be with help from insurance (it may be cheaper with your insurance), but the website can give you the price if you did not use any insurance.  - You can print the associated coupon and take it with   your prescription to the pharmacy.  - You may also stop by our office during regular business hours and pick up a GoodRx coupon card.  - If you need your prescription sent electronically to a different pharmacy, notify our office through [place] [facility] MyChart or by phone at [phone] option 4.     Si Usted Necesita Algo Despus de Su Visita  Tambin puede enviarnos un mensaje a travs de MyChart. Por lo general respondemos a los mensajes de MyChart en el transcurso de 1 a 2  das hbiles.  Para renovar recetas, por favor pida a su farmacia que se ponga en contacto con nuestra oficina. Nuestro nmero de fax es el [phone].  Si tiene un asunto urgente cuando la clnica est cerrada y que no puede esperar hasta el siguiente da hbil, puede llamar/localizar a su doctor(a) al nmero que aparece a continuacin.   Por favor, tenga en cuenta que aunque hacemos todo lo posible para estar disponibles para asuntos urgentes fuera del horario de oficina, no estamos disponibles las 24 horas del da, los 7 das de la semana.   Si tiene un problema urgente y no puede comunicarse con nosotros, puede optar por buscar atencin mdica  en el consultorio de su doctor(a), en una clnica privada, en un centro de atencin urgente o en una sala de emergencias.  Si tiene una emergencia mdica, por favor llame inmediatamente al 911 o vaya a la sala de emergencias.  Nmeros de bper  - Dr. Kowalski: [phone]  - Dra. Moye: [phone]  - Dra. Stewart: [phone]  En caso de inclemencias del tiempo, por favor llame a nuestra lnea principal al [phone] para una actualizacin sobre el estado de cualquier retraso o cierre.  Consejos para la medicacin en dermatologa: Por favor, guarde las cajas en las que vienen los medicamentos de uso tpico para ayudarle a seguir las instrucciones sobre dnde y cmo usarlos. Las farmacias generalmente imprimen las instrucciones del medicamento slo en las cajas y no directamente en los tubos del medicamento.   Si su medicamento es muy caro, por favor, pngase en contacto con nuestra oficina llamando al [phone] y presione la opcin 4 o envenos un mensaje a travs de MyChart.   No podemos decirle cul ser su copago por los medicamentos por adelantado ya que esto es diferente dependiendo de la cobertura de su seguro. Sin embargo, es posible que podamos encontrar un medicamento sustituto a menor costo o llenar un formulario para que el  seguro cubra el medicamento que se considera necesario.   Si se requiere una autorizacin previa para que su compaa de seguros cubra su medicamento, por favor permtanos de 1 a 2 das hbiles para completar este proceso.  Los precios de los medicamentos varan con frecuencia dependiendo del lugar de dnde se surte la receta y alguna farmacias pueden ofrecer precios ms baratos.  El sitio web www.goodrx.com tiene cupones para medicamentos de diferentes farmacias. Los precios aqu no tienen en cuenta lo que podra costar con la ayuda del seguro (puede ser ms barato con su seguro), pero el sitio web puede darle el precio si no utiliz ningn seguro.  - Puede imprimir el cupn correspondiente y llevarlo con su receta a la farmacia.  - Tambin puede pasar por nuestra oficina durante el horario de atencin regular y recoger una tarjeta de cupones de GoodRx.  - Si necesita que su receta se enve electrnicamente a una farmacia diferente, informe a nuestra oficina a travs de MyChart de [place]   o por telfono llamando al [phone] y presione la opcin 4.  

## 2022-03-28 NOTE — Progress Notes (Unsigned)
   Isotretinoin Follow-Up Visit   Subjective  Elaine Young is a 61 y.o. female who presents for the following: Follow-up. Isotretinoin therapy patient taking 40 mg daily.   Week # 24   Isotretinoin F/U - 03/28/22 1200       Isotretinoin Follow Up   iPledge # [id number]    Date 03/28/22    Weight 209 lb (94.8 kg)    Acne breakouts since last visit? No      Dosage   Target Dosage (mg) 14010    Current (To Date) Dosage (mg) 5400    To Go Dosage (mg) 8610      Side Effects   Skin Chapped Lips;Dry Skin    Gastrointestinal WNL    Neurological WNL    Constitutional WNL             Side effects: Dry skin, dry lips  Denies changes in night vision, shortness of breath, abdominal pain, nausea, vomiting, diarrhea, blood in stool or urine, visual changes, headaches, epistaxis, joint pain, myalgias, mood changes, depression, or suicidal ideation.   Patient is not pregnant, not seeking pregnancy, and not breastfeeding.  The following portions of the chart were reviewed this encounter and updated as appropriate: medications, allergies, medical history  Review of Systems:  No other skin or systemic complaints except as noted in HPI or Assessment and Plan.  Objective  Well appearing patient in no apparent distress; mood and affect are within normal limits.  An examination of the face, neck, chest, and back was performed and relevant findings are noted below.   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 Patient is on Isotretinoin -  requiring FDA mandated monthly evaluations and laboratory monitoring.   While taking isotretinoin, do not share pills and do not donate blood. Generic isotretinoin is best absorbed when taken with a fatty meal. Isotretinoin can make you sensitive to the sun. Daily careful sun protection including sunscreen SPF  30+ when outdoors is recommended.    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    Continue Isotretinoin 40mg  po QD.   Total dose to date: 5,400 mg  Total mg/kd dose: 57 mg/kg   Ipledge #[id number] [facility]   Related Medications ISOtretinoin (ACCUTANE) 40 MG capsule Take 1 capsule (40 mg total) by mouth daily.  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 Luvenia Heller Cortibalm recommended  Long term medication management (isotretinoin) - While taking Isotretinoin and for 30 days after you finish the medication, do not get pregnant, do not share pills, do not donate blood. Isotretinoin is best absorbed when taken with a fatty meal. Isotretinoin can make you sensitive to the sun. Daily careful sun protection including sunscreen SPF 30+ when outdoors is recommended.  Follow-up in 30 days.  IMarye Round, CMA, am acting as scribe for Sarina Ser, MD .  Documentation: I have reviewed the above documentation for accuracy and completeness, and I agree with the above.  Sarina Ser, MD

## 2022-03-29 ENCOUNTER — Encounter: Payer: Self-pay | Admitting: Dermatology

## 2022-05-02 ENCOUNTER — Ambulatory Visit: Payer: BLUE CROSS/BLUE SHIELD | Admitting: Dermatology

## 2022-05-02 VITALS — Wt 209.0 lb

## 2022-05-02 DIAGNOSIS — Z79899 Other long term (current) drug therapy: Secondary | ICD-10-CM | POA: Diagnosis not present

## 2022-05-02 DIAGNOSIS — L700 Acne vulgaris: Secondary | ICD-10-CM

## 2022-05-02 DIAGNOSIS — L853 Xerosis cutis: Secondary | ICD-10-CM

## 2022-05-02 DIAGNOSIS — K130 Diseases of lips: Secondary | ICD-10-CM

## 2022-05-02 MED ORDER — ISOTRETINOIN 30 MG PO CAPS: ORAL_CAPSULE | ORAL | 0 refills | Status: DC

## 2022-05-02 NOTE — Progress Notes (Signed)
   Isotretinoin Follow-Up Visit   Subjective  Elaine Young is a 61 y.o. female who presents for the following: Acne (Isotretinoin 40 mg po QD - week 28, xerosis and cheilitis other wise no s/e).  Week # 28   Isotretinoin F/U - 05/02/22 1200       Isotretinoin Follow Up   iPledge # [id number]    Date 05/02/22    Weight 209 lb (94.8 kg)    Acne breakouts since last visit? No      Dosage   Target Dosage (mg) 14010    Current (To Date) Dosage (mg) 6600    To Go Dosage (mg) 7410      Side Effects   Skin Chapped Lips;Dry Lips;Dry Skin    Gastrointestinal WNL    Neurological WNL    Constitutional WNL            Side effects: Dry skin, dry lips  Denies changes in night vision, shortness of breath, abdominal pain, nausea, vomiting, diarrhea, blood in stool or urine, visual changes, headaches, epistaxis, joint pain, myalgias, mood changes, depression, or suicidal ideation.   The following portions of the chart were reviewed this encounter and updated as appropriate: medications, allergies, medical history  Review of Systems:  No other skin or systemic complaints except as noted in HPI or Assessment and Plan.  Objective  Well appearing patient in no apparent distress; mood and affect are within normal limits.  An examination of the face, neck, chest, and back was performed and relevant findings are noted below.   Face    Assessment & Plan   Acne vulgaris Face  Acne is Severe; chronic and persistent; not at goal. Patient is on Isotretinoin -  requiring FDA mandated monthly evaluations and laboratory monitoring.  While taking isotretinoin, do not share pills and do not donate blood. Generic isotretinoin is best absorbed when taken with a fatty meal. Isotretinoin can make you sensitive to the sun. Daily careful sun protection including sunscreen SPF 30+ when outdoors is recommended.   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    Continue  Isotretinoin increase to 30 mg 2 po QD. #60 0RF.    Total dose to date: 6,600 mg  Total mg/kd dose: 69.6 mg/kg   Ipledge #[id number] [facility]     ISOtretinoin (ACCUTANE) 30 MG capsule - Face Take 2 caps po QD.   Xerosis secondary to isotretinoin therapy - Continue emollients as directed - Xyzal (levocetirizine) once a day and fish oil 1 gram daily may also help with dryness  Cheilitis secondary to isotretinoin therapy - Continue lip balm as directed, Dr. Luvenia Heller Cortibalm recommended  Long term medication management (isotretinoin) - While taking Isotretinoin and for 30 days after you finish the medication, do not share pills, do not donate blood. Isotretinoin is best absorbed when taken with a fatty meal. Isotretinoin can make you sensitive to the sun. Daily careful sun protection including sunscreen SPF 30+ when outdoors is recommended.  Follow-up in 30 days.  Luther Redo, CMA, am acting as scribe for Sarina Ser, MD . Documentation: I have reviewed the above documentation for accuracy and completeness, and I agree with the above.  Sarina Ser, MD

## 2022-05-02 NOTE — Patient Instructions (Signed)
Due to recent changes in healthcare laws, you may see results of your pathology and/or laboratory studies on MyChart before the doctors have had a chance to review them. We understand that in some cases there may be results that are confusing or concerning to you. Please understand that not all results are received at the same time and often the doctors may need to interpret multiple results in order to provide you with the best plan of care or course of treatment. Therefore, we ask that you please give us 2 business days to thoroughly review all your results before contacting the office for clarification. Should we see a critical lab result, you will be contacted sooner.   If You Need Anything After Your Visit  If you have any questions or concerns for your doctor, please call our main line at [phone] and press option 4 to reach your doctor's medical assistant. If no one answers, please leave a voicemail as directed and we will return your call as soon as possible. Messages left after 4 pm will be answered the following business day.   You may also send us a message via MyChart. We typically respond to MyChart messages within 1-2 business days.  For prescription refills, please ask your pharmacy to contact our office. Our fax number is [phone].  If you have an urgent issue when the clinic is closed that cannot wait until the next business day, you can page your doctor at the number below.    Please note that while we do our best to be available for urgent issues outside of office hours, we are not available 24/7.   If you have an urgent issue and are unable to reach us, you may choose to seek medical care at your doctor's office, retail clinic, urgent care center, or emergency room.  If you have a medical emergency, please immediately call 911 or go to the emergency department.  Pager Numbers  - Dr. Kowalski: [phone]  - Dr. Moye: [phone]  - Dr. Stewart:  [phone]  In the event of inclement weather, please call our main line at [phone] for an update on the status of any delays or closures.  Dermatology Medication Tips: Please keep the boxes that topical medications come in in order to help keep track of the instructions about where and how to use these. Pharmacies typically print the medication instructions only on the boxes and not directly on the medication tubes.   If your medication is too expensive, please contact our office at [phone] option 4 or send us a message through MyChart.   We are unable to tell what your co-pay for medications will be in advance as this is different depending on your insurance coverage. However, we may be able to find a substitute medication at lower cost or fill out paperwork to get insurance to cover a needed medication.   If a prior authorization is required to get your medication covered by your insurance company, please allow us 1-2 business days to complete this process.  Drug prices often vary depending on where the prescription is filled and some pharmacies may offer cheaper prices.  The website www.goodrx.com contains coupons for medications through different pharmacies. The prices here do not account for what the cost may be with help from insurance (it may be cheaper with your insurance), but the website can give you the price if you did not use any insurance.  - You can print the associated coupon and take it with   your prescription to the pharmacy.  - You may also stop by our office during regular business hours and pick up a GoodRx coupon card.  - If you need your prescription sent electronically to a different pharmacy, notify our office through [facility] MyChart or by phone at [phone] option 4.     Si Usted Necesita Algo Despus de Su Visita  Tambin puede enviarnos un mensaje a travs de MyChart. Por lo general respondemos a los mensajes de MyChart en el transcurso de 1 a 2  das hbiles.  Para renovar recetas, por favor pida a su farmacia que se ponga en contacto con nuestra oficina. Nuestro nmero de fax es el [phone].  Si tiene un asunto urgente cuando la clnica est cerrada y que no puede esperar hasta el siguiente da hbil, puede llamar/localizar a su doctor(a) al nmero que aparece a continuacin.   Por favor, tenga en cuenta que aunque hacemos todo lo posible para estar disponibles para asuntos urgentes fuera del horario de oficina, no estamos disponibles las 24 horas del da, los 7 das de la semana.   Si tiene un problema urgente y no puede comunicarse con nosotros, puede optar por buscar atencin mdica  en el consultorio de su doctor(a), en una clnica privada, en un centro de atencin urgente o en una sala de emergencias.  Si tiene una emergencia mdica, por favor llame inmediatamente al 911 o vaya a la sala de emergencias.  Nmeros de bper  - Dr. Kowalski: [phone]  - Dra. Moye: [phone]  - Dra. Stewart: [phone]  En caso de inclemencias del tiempo, por favor llame a nuestra lnea principal al [phone] para una actualizacin sobre el estado de cualquier retraso o cierre.  Consejos para la medicacin en dermatologa: Por favor, guarde las cajas en las que vienen los medicamentos de uso tpico para ayudarle a seguir las instrucciones sobre dnde y cmo usarlos. Las farmacias generalmente imprimen las instrucciones del medicamento slo en las cajas y no directamente en los tubos del medicamento.   Si su medicamento es muy caro, por favor, pngase en contacto con nuestra oficina llamando al [phone] y presione la opcin 4 o envenos un mensaje a travs de MyChart.   No podemos decirle cul ser su copago por los medicamentos por adelantado ya que esto es diferente dependiendo de la cobertura de su seguro. Sin embargo, es posible que podamos encontrar un medicamento sustituto a menor costo o llenar un formulario para que el  seguro cubra el medicamento que se considera necesario.   Si se requiere una autorizacin previa para que su compaa de seguros cubra su medicamento, por favor permtanos de 1 a 2 das hbiles para completar este proceso.  Los precios de los medicamentos varan con frecuencia dependiendo del lugar de dnde se surte la receta y alguna farmacias pueden ofrecer precios ms baratos.  El sitio web www.goodrx.com tiene cupones para medicamentos de diferentes farmacias. Los precios aqu no tienen en cuenta lo que podra costar con la ayuda del seguro (puede ser ms barato con su seguro), pero el sitio web puede darle el precio si no utiliz ningn seguro.  - Puede imprimir el cupn correspondiente y llevarlo con su receta a la farmacia.  - Tambin puede pasar por nuestra oficina durante el horario de atencin regular y recoger una tarjeta de cupones de GoodRx.  - Si necesita que su receta se enve electrnicamente a una farmacia diferente, informe a nuestra oficina a travs de MyChart de [place]   o por telfono llamando al [phone] y presione la opcin 4.  

## 2022-05-18 ENCOUNTER — Encounter: Payer: Self-pay | Admitting: Dermatology

## 2022-06-06 ENCOUNTER — Ambulatory Visit: Payer: BLUE CROSS/BLUE SHIELD | Admitting: Dermatology

## 2022-06-08 ENCOUNTER — Telehealth: Payer: Self-pay | Admitting: Primary Care

## 2022-06-08 ENCOUNTER — Ambulatory Visit
Admission: EM | Admit: 2022-06-08 | Discharge: 2022-06-08 | Disposition: A | Discharge status: Home / Self Care | Payer: BLUE CROSS/BLUE SHIELD | Attending: Urgent Care | Admitting: Urgent Care

## 2022-06-08 DIAGNOSIS — L02211 Cutaneous abscess of abdominal wall: Secondary | ICD-10-CM | POA: Diagnosis not present

## 2022-06-08 MED ORDER — CEPHALEXIN 500 MG PO CAPS: 500.0000 mg | ORAL_CAPSULE | Freq: Four times a day (QID) | ORAL | 0 refills | Status: AC

## 2022-06-08 NOTE — Telephone Encounter (Signed)
Noted. Will await notes.  

## 2022-06-08 NOTE — ED Provider Notes (Signed)
Renaldo Fiddler    CSN: [id number] Arrival date & time: 06/08/22  1554      History   Chief Complaint Chief Complaint  Patient presents with   Abscess    Entered by patient    HPI Maleeha Halls is a 61 y.o. female.    Abscess   Presents to urgent care with complaints of an abscess to her left lower abdomen, present x 3 weeks.  Patient endorses redness, swelling, pain, purulent drainage.  She says the drainage started about 2 days ago.  She has kept it covered with antibacterial ointment and gauze.  Denies previous abscesses in that or other areas of her body.  Past Medical History:  Diagnosis Date   Carpal tunnel syndrome    Cervical disc disorder    Complication of anesthesia    Essential hypertension    Functional incontinence    Generalized anxiety disorder    Hyperlipidemia    Lower extremity numbness    Lumbar disc disease    Migraines    PONV (postoperative nausea and vomiting)    per pt, she usually gets zofran and scopalamine patch    Renal disorder    kidney stone   Seasonal allergies    Vitamin D deficiency     Patient Active Problem List   Diagnosis Date Noted   Acne vulgaris 09/12/2021   Post-viral cough syndrome 08/02/2021   RLQ abdominal pain 10/16/2020   Cough 08/03/2020   S/P lumbar fusion 11/16/2019   Palpitations 12/17/2018   Chronic lower back pain 06/02/2018   Family history of brain aneurysm 04/03/2016   Preventative health care 04/03/2016   Prediabetes 04/03/2016   Essential hypertension 08/09/2015   Hyperlipidemia 08/09/2015   Anxiety and depression 08/09/2015   Lower extremity numbness 08/09/2015    Past Surgical History:  Procedure Laterality Date   ABDOMINAL EXPOSURE N/A 11/16/2019   Procedure: ABDOMINAL EXPOSURE;  Surgeon: Cephus Shelling, MD;  Location: [facility] OR;  Service: Vascular;  Laterality: N/A;   ABDOMINAL HYSTERECTOMY     ANTERIOR LUMBAR FUSION N/A 11/16/2019   Procedure: LUMBAR FIVE-SACRAL ONE  ANTERIOR LUMBAR INTERBODY FUSION;  Surgeon: Tia Alert, MD;  Location: [facility] OR;  Service: Neurosurgery;  Laterality: N/A;  anterior approach   BLADDER SUSPENSION  2005   BREAST BIOPSY Bilateral yrs ago in [place]   high risk lesions, not cancer per pt   BREAST CYST EXCISION Bilateral yrs ago in [place]   hard to see some excision areas   CHOLECYSTECTOMY     TONSILLECTOMY      OB History   No obstetric history on file.      Home Medications    Prior to Admission medications   Medication Sig Start Date End Date Taking? Authorizing Provider  acetaminophen (TYLENOL) 500 MG tablet Take 500 mg by mouth every 6 (six) hours as needed (for pain.).    [provider]  amLODipine (NORVASC) 5 MG tablet TAKE 1 TABLET BY MOUTH DAILY FOR BLOOD PRESSURE 08/18/21   Doreene Nest, NP  gabapentin (NEURONTIN) 300 MG capsule Take 1 capsule (300 mg total) by mouth in the morning and at bedtime. For pain. 09/12/21   Doreene Nest, NP  ibuprofen (ADVIL) 200 MG tablet Take 800 mg by mouth every 6 (six) hours as needed for headache or moderate pain.    [provider]  ISOtretinoin (ACCUTANE) 30 MG capsule Take 2 caps po QD. 05/02/22   Deirdre Evener, MD  rosuvastatin (  CRESTOR) 10 MG tablet TAKE 1 TABLET BY MOUTH DAILY FOR CHOLESTEROL DUE FOR OFFICE VISIT IN MARCH 2023 08/18/21   Doreene Nest, NP  venlafaxine XR (EFFEXOR-XR) 150 MG 24 hr capsule TAKE 1 CAPSULE BY MOUTH DAILY  WITH BREAKFAST FOR ANXIETY AND  DEPRESSION, TAKE WITH 37.5 MG. 09/12/21   Doreene Nest, NP  venlafaxine XR (EFFEXOR-XR) 37.5 MG 24 hr capsule TAKE 1 CAPSULE BY MOUTH DAILY  WITH BREAKFAST FOR DEPRESSION  AND ANXIETY, TAKE WITH 150 MG. 09/12/21   Doreene Nest, NP    Family History Family History  Problem Relation Age of Onset   Hyperlipidemia Mother    Breast cancer Mother 42   Ovarian cancer Maternal Aunt    Lung cancer Maternal Uncle    Arthritis Maternal Grandmother    Arthritis Maternal  Grandfather    Colon cancer Maternal Grandfather    Arthritis Paternal Grandmother    Hypertension Paternal Grandmother    Diabetes Paternal Grandmother    Arthritis Paternal Grandfather    Diabetes Paternal Grandfather    Arthritis Cousin    Breast cancer Cousin        maternal side x2    Social History Social History   Tobacco Use   Smoking status: Never   Smokeless tobacco: Never  Vaping Use   Vaping Use: Never used  Substance Use Topics   Alcohol use: Yes    Alcohol/week: 0.0 standard drinks of alcohol   Drug use: No     Allergies   Codeine, Iodine, Latex, Lipitor [atorvastatin], Shellfish allergy, and Sulfa antibiotics   Review of Systems Review of Systems   Physical Exam Triage Vital Signs ED Triage Vitals  Enc Vitals Group     BP 06/08/22 1617 (!) 151/86     Pulse Rate 06/08/22 1617 90     Resp 06/08/22 1617 16     Temp 06/08/22 1617 98.1 F (36.7 C)     Temp src --      SpO2 06/08/22 1617 95 %     Weight --      Height --      Head Circumference --      Peak Flow --      Pain Score 06/08/22 1618 0     Pain Loc --      Pain Edu? --      Excl. in GC? --    No data found.  Updated Vital Signs BP (!) 151/86   Pulse 90   Temp 98.1 F (36.7 C)   Resp 16   SpO2 95%   Visual Acuity Right Eye Distance:   Left Eye Distance:   Bilateral Distance:    Right Eye Near:   Left Eye Near:    Bilateral Near:     Physical Exam Vitals reviewed.  Constitutional:      Appearance: Normal appearance.  Abdominal:    Skin:    General: Skin is warm and dry.     Findings: Lesion present.  Neurological:     General: No focal deficit present.     Mental Status: She is alert and oriented to person, place, and time.  Psychiatric:        Mood and Affect: Mood normal.        Behavior: Behavior normal.      UC Treatments / Results  Labs (all labs ordered are listed, but only abnormal results are displayed) Labs Reviewed - No data to  display  EKG  Radiology No results found.  Procedures Procedures (including critical care time)  Medications Ordered in UC Medications - No data to display  Initial Impression / Assessment and Plan / UC Course  I have reviewed the triage vital signs and the nursing notes.  Pertinent labs & imaging results that were available during my care of the patient were reviewed by me and considered in my medical decision making (see chart for details).   Cutaneous abscess/boil.  Will treat for presumed staph with cephalexin.  Patient has scheduled evaluation with dermatology next week and will follow-up with any I&D at that visit   Final Clinical Impressions(s) / UC Diagnoses   Final diagnoses:  None   Discharge Instructions   None    ED Prescriptions   None    PDMP not reviewed this encounter.   Charma Igo, [place] 06/08/22 1645

## 2022-06-08 NOTE — Telephone Encounter (Signed)
[facility] - Client TELEPHONE ADVICE RECORD [facility] Patient Name: Elaine Young Gender: Female DOB: 1960-10-07 Age: 61 Y 63 M 10 D Return Phone Number: [phone] (Primary) Address: City/ State/ Zip: [place]  [address] Client [place] [facility] - Client Client Site [facility] - Day Provider Alma Friendly - NP Contact Type Call Who Is Calling Patient / Member / Family / Caregiver Call Type Triage / Clinical Relationship To Patient Self Return Phone Number [phone] (Primary) Chief Complaint Sores Reason for Call Symptomatic / Request for [place] says she has a abscess on her chest and it is causing pain. She says it is draining. The office does not have any openings for today per the staff that transferred her over. Translation No Nurse Assessment Nurse: Cherre Robins, RN, Ria Comment Date/Time (Eastern Time): 06/08/2022 2:16:14 PM Confirm and document reason for call. If symptomatic, describe symptoms. ---Caller states she has an abscess on her abdomen that's draining and painful. States it started as a hard red bump and progressed to pus filled. No fever. Does the patient have any new or worsening symptoms? ---Yes Will a triage be completed? ---Yes Related visit to physician within the last 2 weeks? ---No Does the PT have any chronic conditions? (i.e. diabetes, asthma, this includes High risk factors for pregnancy, etc.) ---Yes List chronic conditions. ---HTN, high cholesterol, depression, on acutane Is this a behavioral health or substance abuse call? ---No Guidelines Guideline Title Affirmed Question Affirmed Notes Nurse Date/Time (Eastern Time) Boil (Skin Abscess) Boil > 2 inches across (> 5 cm; larger than a golf ball or ping pong ball) Weiss-Hilton, RN, Ria Comment 06/08/2022 2:17:57 PM Disp. Time Eilene Ghazi Time) Disposition Final  User 06/08/2022 2:10:00 PM Send To RN Personal Weiss-Hilton, RN, Ria Comment 06/08/2022 2:24:37 PM See PCP within 24 Hours Yes Weiss-Hilton, RN, Ria Comment PLEASE NOTE: All timestamps contained within this report are represented as [place] Standard Time. CONFIDENTIALTY NOTICE: This fax transmission is intended only for the addressee. It contains information that is legally privileged, confidential or otherwise protected from use or disclosure. If you are not the intended recipient, you are strictly prohibited from reviewing, disclosing, copying using or disseminating any of this information or taking any action in reliance on or regarding this information. If you have received this fax in error, please notify us immediately by telephone so that we can arrange for its return to [place]. Phone: [phone], Toll-Free: [phone], Fax: [phone] Page: 2 of 2 Call Id: [id number] Final Disposition 06/08/2022 2:24:37 PM See PCP within 24 Hours Yes Weiss-Hilton, RN, Ivonne Andrew Disagree/Comply Comply Caller Understands Yes PreDisposition Call Doctor Care Advice Given Per Guideline SEE PCP WITHIN 24 HOURS: * IF OFFICE WILL BE CLOSED: You need to be seen within the next 24 hours. A clinic or an urgent care center is often a good source of care if your doctor's office is closed or you can't get an appointment. * Avoid touching or scratching the area. * If the boil drains pus: continue to apply a warm wet washcloth to the boil 3 times a day for three more days. TREATMENT - APPLY ANTIBIOTIC OINTMENT: * Apply an over-the-counter antibiotic ointment (e.g., Bacitracin) to the area of redness. * Do this three times daily. DRAINING PUS IS CONTAGIOUS: * Pus or other drainage from an open boil is very contagious. * You can spread it to others. PREVENTING SPREAD TO YOURSELF AND OTHERS: * Wash the boil with soap and  water each day. * Keep the boil covered with a clean dry dressing (e.g., gauze pad and tape). Throw the  dirty dressings into the regular trash. * Make certain to wash your hands with soap and water after changing the dressing. * Shower daily with an antibacterial soap. Allow the soap to remain on your skin for five minutes before rinsing. Showers are best because baths still leave many Staph bacteria on the skin. * Use a clean towel daily. * Launder any clothes, sheets, and towels that become contaminated with drainage. * Before taking any medicine, read all the instructions on the package. CALL BACK IF: * Severe pain or fever occurs * Widespread rash occurs * You become worse CARE ADVICE per Boil (Skin Abscess) (Adult) guideline. Comments User: Jovita Kussmaul, RN Date/Time Lamount Cohen Time): 06/08/2022 2:09:44 PM Caller requested return call in a few minutes. Referrals  Urgent Care at [facility]

## 2022-06-08 NOTE — ED Triage Notes (Signed)
Pt. Presents to UC w/ an abscess to the left lower abdomen that has been present for the past 3 weeks. Pt. Endorses redness, swelling and pain @ the site. Pt. States the abscess popped on it's own 2 days ago.

## 2022-06-08 NOTE — Telephone Encounter (Signed)
I spoke with pt and she is not sure what UC she is going to yet but pt is going to and UC this afternoon to be seen. I apologized no available appts at [facility] or [facility] ; pt voiced understanding and said she will get seen at [facility]. Sending note to Allayne Gitelman NP and [facility].

## 2022-06-08 NOTE — Telephone Encounter (Signed)
Patient called and stated she has an abscess on her stomach and puss is coming out. Pain level is a 5. Patient was sent to access nurse.

## 2022-06-08 NOTE — Discharge Instructions (Signed)
Follow-up with your dermatologist at the next scheduled appointment.

## 2022-06-13 ENCOUNTER — Ambulatory Visit: Payer: BLUE CROSS/BLUE SHIELD | Admitting: Dermatology

## 2022-06-13 VITALS — Wt 209.0 lb

## 2022-06-13 DIAGNOSIS — Z79899 Other long term (current) drug therapy: Secondary | ICD-10-CM

## 2022-06-13 DIAGNOSIS — L853 Xerosis cutis: Secondary | ICD-10-CM

## 2022-06-13 DIAGNOSIS — L720 Epidermal cyst: Secondary | ICD-10-CM

## 2022-06-13 DIAGNOSIS — K130 Diseases of lips: Secondary | ICD-10-CM

## 2022-06-13 DIAGNOSIS — L700 Acne vulgaris: Secondary | ICD-10-CM | POA: Diagnosis not present

## 2022-06-13 MED ORDER — MUPIROCIN 2 % EX OINT: 1.0000 | TOPICAL_OINTMENT | Freq: Every day | CUTANEOUS | 0 refills | Status: DC

## 2022-06-13 MED ORDER — ISOTRETINOIN 30 MG PO CAPS: 30.0000 mg | ORAL_CAPSULE | Freq: Two times a day (BID) | ORAL | 0 refills | Status: DC

## 2022-06-13 NOTE — Patient Instructions (Signed)
Due to recent changes in healthcare laws, you may see results of your pathology and/or laboratory studies on MyChart before the doctors have had a chance to review them. We understand that in some cases there may be results that are confusing or concerning to you. Please understand that not all results are received at the same time and often the doctors may need to interpret multiple results in order to provide you with the best plan of care or course of treatment. Therefore, we ask that you please give us 2 business days to thoroughly review all your results before contacting the office for clarification. Should we see a critical lab result, you will be contacted sooner.   If You Need Anything After Your Visit  If you have any questions or concerns for your doctor, please call our main line at [phone] and press option 4 to reach your doctor's medical assistant. If no one answers, please leave a voicemail as directed and we will return your call as soon as possible. Messages left after 4 pm will be answered the following business day.   You may also send us a message via MyChart. We typically respond to MyChart messages within 1-2 business days.  For prescription refills, please ask your pharmacy to contact our office. Our fax number is [phone].  If you have an urgent issue when the clinic is closed that cannot wait until the next business day, you can page your doctor at the number below.    Please note that while we do our best to be available for urgent issues outside of office hours, we are not available 24/7.   If you have an urgent issue and are unable to reach us, you may choose to seek medical care at your doctor's office, retail clinic, urgent care center, or emergency room.  If you have a medical emergency, please immediately call 911 or go to the emergency department.  Pager Numbers  - Dr. Kowalski: [phone]  - Dr. Moye: [phone]  - Dr. Stewart:  [phone]  In the event of inclement weather, please call our main line at [phone] for an update on the status of any delays or closures.  Dermatology Medication Tips: Please keep the boxes that topical medications come in in order to help keep track of the instructions about where and how to use these. Pharmacies typically print the medication instructions only on the boxes and not directly on the medication tubes.   If your medication is too expensive, please contact our office at [phone] option 4 or send us a message through MyChart.   We are unable to tell what your co-pay for medications will be in advance as this is different depending on your insurance coverage. However, we may be able to find a substitute medication at lower cost or fill out paperwork to get insurance to cover a needed medication.   If a prior authorization is required to get your medication covered by your insurance company, please allow us 1-2 business days to complete this process.  Drug prices often vary depending on where the prescription is filled and some pharmacies may offer cheaper prices.  The website www.goodrx.com contains coupons for medications through different pharmacies. The prices here do not account for what the cost may be with help from insurance (it may be cheaper with your insurance), but the website can give you the price if you did not use any insurance.  - You can print the associated coupon and take it with   your prescription to the pharmacy.  - You may also stop by our office during regular business hours and pick up a GoodRx coupon card.  - If you need your prescription sent electronically to a different pharmacy, notify our office through [facility] MyChart or by phone at [phone] option 4.     Si Usted Necesita Algo Despus de Su Visita  Tambin puede enviarnos un mensaje a travs de MyChart. Por lo general respondemos a los mensajes de MyChart en el transcurso de 1 a 2  das hbiles.  Para renovar recetas, por favor pida a su farmacia que se ponga en contacto con nuestra oficina. Nuestro nmero de fax es el [phone].  Si tiene un asunto urgente cuando la clnica est cerrada y que no puede esperar hasta el siguiente da hbil, puede llamar/localizar a su doctor(a) al nmero que aparece a continuacin.   Por favor, tenga en cuenta que aunque hacemos todo lo posible para estar disponibles para asuntos urgentes fuera del horario de oficina, no estamos disponibles las 24 horas del da, los 7 das de la semana.   Si tiene un problema urgente y no puede comunicarse con nosotros, puede optar por buscar atencin mdica  en el consultorio de su doctor(a), en una clnica privada, en un centro de atencin urgente o en una sala de emergencias.  Si tiene una emergencia mdica, por favor llame inmediatamente al 911 o vaya a la sala de emergencias.  Nmeros de bper  - Dr. Kowalski: [phone]  - Dra. Moye: [phone]  - Dra. Stewart: [phone]  En caso de inclemencias del tiempo, por favor llame a nuestra lnea principal al [phone] para una actualizacin sobre el estado de cualquier retraso o cierre.  Consejos para la medicacin en dermatologa: Por favor, guarde las cajas en las que vienen los medicamentos de uso tpico para ayudarle a seguir las instrucciones sobre dnde y cmo usarlos. Las farmacias generalmente imprimen las instrucciones del medicamento slo en las cajas y no directamente en los tubos del medicamento.   Si su medicamento es muy caro, por favor, pngase en contacto con nuestra oficina llamando al [phone] y presione la opcin 4 o envenos un mensaje a travs de MyChart.   No podemos decirle cul ser su copago por los medicamentos por adelantado ya que esto es diferente dependiendo de la cobertura de su seguro. Sin embargo, es posible que podamos encontrar un medicamento sustituto a menor costo o llenar un formulario para que el  seguro cubra el medicamento que se considera necesario.   Si se requiere una autorizacin previa para que su compaa de seguros cubra su medicamento, por favor permtanos de 1 a 2 das hbiles para completar este proceso.  Los precios de los medicamentos varan con frecuencia dependiendo del lugar de dnde se surte la receta y alguna farmacias pueden ofrecer precios ms baratos.  El sitio web www.goodrx.com tiene cupones para medicamentos de diferentes farmacias. Los precios aqu no tienen en cuenta lo que podra costar con la ayuda del seguro (puede ser ms barato con su seguro), pero el sitio web puede darle el precio si no utiliz ningn seguro.  - Puede imprimir el cupn correspondiente y llevarlo con su receta a la farmacia.  - Tambin puede pasar por nuestra oficina durante el horario de atencin regular y recoger una tarjeta de cupones de GoodRx.  - Si necesita que su receta se enve electrnicamente a una farmacia diferente, informe a nuestra oficina a travs de MyChart de    o por telfono llamando al [phone] y presione la opcin 4.  

## 2022-06-13 NOTE — Progress Notes (Signed)
Isotretinoin Follow-Up Visit   Subjective  Elaine Young is a 61 y.o. female who presents for the following: Follow-up (Patient here today for 30 day isotretinoin follow up. ) and Abscess (At right abdomen, started about 3 weeks ago. It has been draining and patient was put on cephalexin last Friday. Patient went to Urgent Care and they recommended patient have it drained. ).  Week # 32   Side effects: Dry skin, dry lips  Denies changes in night vision, shortness of breath, abdominal pain, nausea, vomiting, diarrhea, blood in stool or urine, visual changes, headaches, epistaxis, joint pain, myalgias, mood changes, depression, or suicidal ideation.   The following portions of the chart were reviewed this encounter and updated as appropriate: medications, allergies, medical history  Review of Systems:  No other skin or systemic complaints except as noted in HPI or Assessment and Plan.  Objective  Well appearing patient in no apparent distress; mood and affect are within normal limits.  An examination of the face, neck, chest, and back was performed and relevant findings are noted below.   face Clear of acne and sebaceous hyperplasia  Right Abdomen Ruptured inflamed cyst with abscess formation Continue cephalexin as prescribed Recheck on follow up   Assessment & Plan   Acne vulgaris face  Acne is Severe; chronic and persistent; not at goal. Patient is on Isotretinoin -  requiring FDA mandated monthly evaluations and laboratory monitoring.   While taking isotretinoin, do not share pills and do not donate blood. Generic isotretinoin is best absorbed when taken with a fatty meal. Isotretinoin can make you sensitive to the sun. Daily careful sun protection including sunscreen SPF 30+ when outdoors is recommended.   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    Continue Isotretinoin 30 mg 2 po QD. #60 0RF.    Total dose to date: 8,400 mg  Total mg/kd dose:  88.60 mg/kg   Ipledge #[id number] [facility] pharmacy  Patient confirmed in iPledge and isotretinoin sent to pharmacy.   ISOtretinoin (ACCUTANE) 30 MG capsule - face Take 1 capsule (30 mg total) by mouth 2 (two) times daily.  Related Medications ISOtretinoin (ACCUTANE) 30 MG capsule Take 2 caps po QD.  Epidermal inclusion cyst Right Abdomen  Benign-appearing. Exam most consistent with an epidermal inclusion cyst. Discussed that a cyst is a benign growth that can grow over time and sometimes get irritated or inflamed. Recommend observation if it is not bothersome. Discussed option of surgical excision to remove it if it is growing, symptomatic, or other changes noted. Please call for new or changing lesions so they can be evaluated.  Continue cephalexin as prescribed Recheck on follow up and schedule for surgery Start mupirocin daily and cover    mupirocin ointment (BACTROBAN) 2 % - Right Abdomen Apply 1 Application topically daily.   Xerosis secondary to isotretinoin therapy - Continue emollients as directed - Xyzal (levocetirizine) once a day and fish oil 1 gram daily may also help with dryness  Cheilitis secondary to isotretinoin therapy - Continue lip balm as directed, Dr. [place] Artist Cortibalm recommended  Long term medication management (isotretinoin) - While taking Isotretinoin and for 30 days after you finish the medication, do not share pills, do not donate blood. Isotretinoin is best absorbed when taken with a fatty meal. Isotretinoin can make you sensitive to the sun. Daily careful sun protection including sunscreen SPF 30+ when outdoors is recommended.  Follow-up in 30 days.  Anise Salvo, RMA, am acting as scribe  for Sarina Ser, MD . Documentation: I have reviewed the above documentation for accuracy and completeness, and I agree with the above.  Sarina Ser, MD

## 2022-06-28 ENCOUNTER — Encounter: Payer: Self-pay | Admitting: Dermatology

## 2022-07-10 DIAGNOSIS — Z6831 Body mass index (BMI) 31.0-31.9, adult: Secondary | ICD-10-CM | POA: Diagnosis not present

## 2022-07-10 DIAGNOSIS — M5412 Radiculopathy, cervical region: Secondary | ICD-10-CM | POA: Diagnosis not present

## 2022-07-11 ENCOUNTER — Other Ambulatory Visit: Payer: Self-pay | Admitting: Primary Care

## 2022-07-11 ENCOUNTER — Ambulatory Visit: Payer: BLUE CROSS/BLUE SHIELD | Admitting: Dermatology

## 2022-07-11 VITALS — Wt 209.0 lb

## 2022-07-11 DIAGNOSIS — L720 Epidermal cyst: Secondary | ICD-10-CM

## 2022-07-11 DIAGNOSIS — Z79899 Other long term (current) drug therapy: Secondary | ICD-10-CM

## 2022-07-11 DIAGNOSIS — L700 Acne vulgaris: Secondary | ICD-10-CM

## 2022-07-11 DIAGNOSIS — I10 Essential (primary) hypertension: Secondary | ICD-10-CM

## 2022-07-11 DIAGNOSIS — E785 Hyperlipidemia, unspecified: Secondary | ICD-10-CM

## 2022-07-11 MED ORDER — ROSUVASTATIN CALCIUM 10 MG PO TABS: 10.0000 mg | ORAL_TABLET | Freq: Every day | ORAL | 0 refills | Status: DC

## 2022-07-11 MED ORDER — AMLODIPINE BESYLATE 5 MG PO TABS: 5.0000 mg | ORAL_TABLET | Freq: Every day | ORAL | 0 refills | Status: DC

## 2022-07-11 MED ORDER — ISOTRETINOIN 30 MG PO CAPS: 30.0000 mg | ORAL_CAPSULE | Freq: Two times a day (BID) | ORAL | 0 refills | Status: DC

## 2022-07-11 NOTE — Patient Instructions (Signed)
Acne is Severe; chronic and persistent; not at goal. Patient is on Isotretinoin -  requiring FDA mandated monthly evaluations and laboratory monitoring.  While taking isotretinoin, do not share pills and do not donate blood. Generic isotretinoin is best absorbed when taken with a fatty meal. Isotretinoin can make you sensitive to the sun. Daily careful sun protection including sunscreen SPF 30+ when outdoors is recommended.     Due to recent changes in healthcare laws, you may see results of your pathology and/or laboratory studies on MyChart before the doctors have had a chance to review them. We understand that in some cases there may be results that are confusing or concerning to you. Please understand that not all results are received at the same time and often the doctors may need to interpret multiple results in order to provide you with the best plan of care or course of treatment. Therefore, we ask that you please give us 2 business days to thoroughly review all your results before contacting the office for clarification. Should we see a critical lab result, you will be contacted sooner.   If You Need Anything After Your Visit  If you have any questions or concerns for your doctor, please call our main line at [phone] and press option 4 to reach your doctor's medical assistant. If no one answers, please leave a voicemail as directed and we will return your call as soon as possible. Messages left after 4 pm will be answered the following business day.   You may also send us a message via MyChart. We typically respond to MyChart messages within 1-2 business days.  For prescription refills, please ask your pharmacy to contact our office. Our fax number is [phone].  If you have an urgent issue when the clinic is closed that cannot wait until the next business day, you can page your doctor at the number below.    Please note that while we do our best to be available for urgent issues  outside of office hours, we are not available 24/7.   If you have an urgent issue and are unable to reach us, you may choose to seek medical care at your doctor's office, retail clinic, urgent care center, or emergency room.  If you have a medical emergency, please immediately call 911 or go to the emergency department.  Pager Numbers  - Dr. Kowalski: [phone]  - Dr. Moye: [phone]  - Dr. Stewart: [phone]  In the event of inclement weather, please call our main line at [phone] for an update on the status of any delays or closures.  Dermatology Medication Tips: Please keep the boxes that topical medications come in in order to help keep track of the instructions about where and how to use these. Pharmacies typically print the medication instructions only on the boxes and not directly on the medication tubes.   If your medication is too expensive, please contact our office at [phone] option 4 or send us a message through MyChart.   We are unable to tell what your co-pay for medications will be in advance as this is different depending on your insurance coverage. However, we may be able to find a substitute medication at lower cost or fill out paperwork to get insurance to cover a needed medication.   If a prior authorization is required to get your medication covered by your insurance company, please allow us 1-2 business days to complete this process.  Drug prices often vary depending on where the   prescription is filled and some pharmacies may offer cheaper prices.  The website www.goodrx.com contains coupons for medications through different pharmacies. The prices here do not account for what the cost may be with help from insurance (it may be cheaper with your insurance), but the website can give you the price if you did not use any insurance.  - You can print the associated coupon and take it with your prescription to the pharmacy.  - You may also stop by our  office during regular business hours and pick up a GoodRx coupon card.  - If you need your prescription sent electronically to a different pharmacy, notify our office through [facility] MyChart or by phone at [phone] option 4.     Si Usted Necesita Algo Despus de Su Visita  Tambin puede enviarnos un mensaje a travs de MyChart. Por lo general respondemos a los mensajes de MyChart en el transcurso de 1 a 2 das hbiles.  Para renovar recetas, por favor pida a su farmacia que se ponga en contacto con nuestra oficina. Nuestro nmero de fax es el [phone].  Si tiene un asunto urgente cuando la clnica est cerrada y que no puede esperar hasta el siguiente da hbil, puede llamar/localizar a su doctor(a) al nmero que aparece a continuacin.   Por favor, tenga en cuenta que aunque hacemos todo lo posible para estar disponibles para asuntos urgentes fuera del horario de oficina, no estamos disponibles las 24 horas del da, los 7 das de la semana.   Si tiene un problema urgente y no puede comunicarse con nosotros, puede optar por buscar atencin mdica  en el consultorio de su doctor(a), en una clnica privada, en un centro de atencin urgente o en una sala de emergencias.  Si tiene una emergencia mdica, por favor llame inmediatamente al 911 o vaya a la sala de emergencias.  Nmeros de bper  - Dr. Kowalski: [phone]  - Dra. Moye: [phone]  - Dra. Stewart: [phone]  En caso de inclemencias del tiempo, por favor llame a nuestra lnea principal al [phone] para una actualizacin sobre el estado de cualquier retraso o cierre.  Consejos para la medicacin en dermatologa: Por favor, guarde las cajas en las que vienen los medicamentos de uso tpico para ayudarle a seguir las instrucciones sobre dnde y cmo usarlos. Las farmacias generalmente imprimen las instrucciones del medicamento slo en las cajas y no directamente en los tubos del medicamento.   Si su  medicamento es muy caro, por favor, pngase en contacto con nuestra oficina llamando al [phone] y presione la opcin 4 o envenos un mensaje a travs de MyChart.   No podemos decirle cul ser su copago por los medicamentos por adelantado ya que esto es diferente dependiendo de la cobertura de su seguro. Sin embargo, es posible que podamos encontrar un medicamento sustituto a menor costo o llenar un formulario para que el seguro cubra el medicamento que se considera necesario.   Si se requiere una autorizacin previa para que su compaa de seguros cubra su medicamento, por favor permtanos de 1 a 2 das hbiles para completar este proceso.  Los precios de los medicamentos varan con frecuencia dependiendo del lugar de dnde se surte la receta y alguna farmacias pueden ofrecer precios ms baratos.  El sitio web www.goodrx.com tiene cupones para medicamentos de diferentes farmacias. Los precios aqu no tienen en cuenta lo que podra costar con la ayuda del seguro (puede ser ms barato con su seguro), pero el sitio web   puede darle el precio si no utiliz ningn seguro.  - Puede imprimir el cupn correspondiente y llevarlo con su receta a la farmacia.  - Tambin puede pasar por nuestra oficina durante el horario de atencin regular y recoger una tarjeta de cupones de GoodRx.  - Si necesita que su receta se enve electrnicamente a una farmacia diferente, informe a nuestra oficina a travs de MyChart de [place] o por telfono llamando al [phone] y presione la opcin 4.  

## 2022-07-11 NOTE — Telephone Encounter (Signed)
Patient is due for CPE/follow up in March. Please schedule.

## 2022-07-11 NOTE — Telephone Encounter (Signed)
From: Dario Guardian To: Office of Pleas Koch, NP Sent: 07/10/2022 8:25 PM EST Subject: Medication Renewal Request  Refills have been requested for the following medications:   rosuvastatin (CRESTOR) 10 MG tablet [Jasmyn Picha K Janele Lague]  Preferred pharmacy: [facility] Delivery method: Mail

## 2022-07-11 NOTE — Telephone Encounter (Signed)
Patient scheduled.

## 2022-07-11 NOTE — Progress Notes (Deleted)
   Follow-Up Visit   Subjective  Elaine Young is a 62 y.o. female who presents for the following: Acne (Week #36 - Isotretinoin 60 mg 1 po qd).  The following portions of the chart were reviewed this encounter and updated as appropriate:   Tobacco  Allergies  Meds  Problems  Med Hx  Surg Hx  Fam Hx     Review of Systems:  No other skin or systemic complaints except as noted in HPI or Assessment and Plan.  Objective  Well appearing patient in no apparent distress; mood and affect are within normal limits.  A focused examination was performed including face, abdomen. Relevant physical exam findings are noted in the Assessment and Plan.  Face Clear today   Assessment & Plan  Acne vulgaris Face  Related Medications ISOtretinoin (ACCUTANE) 30 MG capsule Take 1 capsule (30 mg total) by mouth 2 (two) times daily.  Epidermal inclusion cyst Right lat abdomen  Has improved since last visit. Recheck in the future.  Related Medications mupirocin ointment (BACTROBAN) 2 % Apply 1 Application topically daily.   Return in about 30 days (around 08/10/2022) for Isotretinoin.  I, Ashok Cordia, CMA, am acting as scribe for Sarina Ser, MD . Documentation: I have reviewed the above documentation for accuracy and completeness, and I agree with the above.  Sarina Ser, MD

## 2022-07-12 ENCOUNTER — Encounter: Payer: Self-pay | Admitting: Dermatology

## 2022-07-16 NOTE — Progress Notes (Signed)
   Isotretinoin Follow-Up Visit   Subjective  Elaine Young is a 62 y.o. female who presents for the following: Acne (Week #36 - Isotretinoin 60 mg 1 po qd).  Week # 36  Side effects: Dry skin, dry lips  Denies changes in night vision, shortness of breath, abdominal pain, nausea, vomiting, diarrhea, blood in stool or urine, visual changes, headaches, epistaxis, joint pain, myalgias, mood changes, depression, or suicidal ideation.   Patient is not pregnant, not seeking pregnancy, and not breastfeeding.  The following portions of the chart were reviewed this encounter and updated as appropriate: medications, allergies, medical history  Review of Systems:  No other skin or systemic complaints except as noted in HPI or Assessment and Plan.  Objective  Well appearing patient in no apparent distress; mood and affect are within normal limits.  An examination of the face, neck, chest, and back was performed and relevant findings are noted below.   Face Clear today   Assessment & Plan   Acne vulgaris Face Related Medications ISOtretinoin (ACCUTANE) 30 MG capsule Take 1 capsule (30 mg total) by mouth 2 (two) times daily.  Severe; On Isotretinoin -  requiring FDA mandated monthly evaluations and laboratory monitoring; Chronic and Persistent; Not to Goal  Xerosis secondary to isotretinoin therapy - Continue emollients as directed - Xyzal (levocetirizine) once a day and fish oil 1 gram daily may also help with dryness  Cheilitis secondary to isotretinoin therapy - Continue lip balm as directed, Dr. Luvenia Heller Cortibalm recommended  Long term medication management (isotretinoin) - While taking Isotretinoin and for 30 days after you finish the medication, do not get pregnant, do not share pills, do not donate blood. Isotretinoin is best absorbed when taken with a fatty meal. Isotretinoin can make you sensitive to the sun. Daily careful sun protection including sunscreen SPF 30+ when  outdoors is recommended.  Epidermal inclusion cyst Right lat abdomen Has improved since last visit. Recheck in the future.  Related Medications mupirocin ointment (BACTROBAN) 2 % Apply 1 Application topically daily.  Follow-up in 30 days.  I, Ashok Cordia, CMA, am acting as scribe for Sarina Ser, MD . Documentation: I have reviewed the above documentation for accuracy and completeness, and I agree with the above.  Sarina Ser, MD

## 2022-07-25 ENCOUNTER — Other Ambulatory Visit: Payer: Self-pay | Admitting: Primary Care

## 2022-07-25 DIAGNOSIS — F32A Depression, unspecified: Secondary | ICD-10-CM

## 2022-07-25 DIAGNOSIS — F419 Anxiety disorder, unspecified: Secondary | ICD-10-CM

## 2022-07-25 DIAGNOSIS — F411 Generalized anxiety disorder: Secondary | ICD-10-CM

## 2022-07-26 ENCOUNTER — Other Ambulatory Visit: Payer: Self-pay | Admitting: Primary Care

## 2022-07-26 DIAGNOSIS — F32A Depression, unspecified: Secondary | ICD-10-CM

## 2022-08-14 ENCOUNTER — Ambulatory Visit
Admission: EM | Admit: 2022-08-14 | Discharge: 2022-08-14 | Disposition: A | Discharge status: Home / Self Care | Payer: BLUE CROSS/BLUE SHIELD | Attending: Urgent Care | Admitting: Urgent Care

## 2022-08-14 DIAGNOSIS — N3001 Acute cystitis with hematuria: Secondary | ICD-10-CM | POA: Insufficient documentation

## 2022-08-14 DIAGNOSIS — B3731 Acute candidiasis of vulva and vagina: Secondary | ICD-10-CM | POA: Insufficient documentation

## 2022-08-14 LAB — POCT URINALYSIS DIP (MANUAL ENTRY)
Bilirubin, UA: NEGATIVE
Glucose, UA: 100 mg/dL — AB
Ketones, POC UA: NEGATIVE mg/dL
Nitrite, UA: POSITIVE — AB
Protein Ur, POC: NEGATIVE mg/dL
Spec Grav, UA: 1.01 (ref 1.010–1.025)
Urobilinogen, UA: 1 E.U./dL
pH, UA: 5.5 (ref 5.0–8.0)

## 2022-08-14 MED ORDER — NITROFURANTOIN MONOHYD MACRO 100 MG PO CAPS: 100.0000 mg | ORAL_CAPSULE | Freq: Two times a day (BID) | ORAL | 0 refills | Status: DC

## 2022-08-14 MED ORDER — FLUCONAZOLE 150 MG PO TABS: 150.0000 mg | ORAL_TABLET | Freq: Once | ORAL | 0 refills | Status: AC

## 2022-08-14 NOTE — ED Triage Notes (Signed)
Patient presents to [facility] for UTI. Reports hematuria x 2 days. Took azo.

## 2022-08-14 NOTE — ED Notes (Signed)
Error for UA results. Correct results entered.

## 2022-08-14 NOTE — Discharge Instructions (Signed)
Follow up here or with your primary care provider if your symptoms are worsening or not improving with treatment.     

## 2022-08-14 NOTE — ED Provider Notes (Signed)
Roderic Palau    CSN: [id number] Arrival date & time: 08/14/22  1803      History   Chief Complaint Chief Complaint  Patient presents with   Hematuria    HPI Analyssa Rohrbaugh is a 62 y.o. female.    Hematuria    Patient presents to urgent care with concern for possible UTI.  She reports abdominal pressure as well as hematuria x 2 days.  She is using Azo for her symptoms.  Fever, chills, body aches.  Past Medical History:  Diagnosis Date   Carpal tunnel syndrome    Cervical disc disorder    Complication of anesthesia    Essential hypertension    Functional incontinence    Generalized anxiety disorder    Hyperlipidemia    Lower extremity numbness    Lumbar disc disease    Migraines    PONV (postoperative nausea and vomiting)    per pt, she usually gets zofran and scopalamine patch    Renal disorder    kidney stone   Seasonal allergies    Vitamin D deficiency     Patient Active Problem List   Diagnosis Date Noted   Acne vulgaris 09/12/2021   Post-viral cough syndrome 08/02/2021   RLQ abdominal pain 10/16/2020   Cough 08/03/2020   S/P lumbar fusion 11/16/2019   Palpitations 12/17/2018   Chronic lower back pain 06/02/2018   Family history of brain aneurysm 04/03/2016   Preventative health care 04/03/2016   Prediabetes 04/03/2016   Essential hypertension 08/09/2015   Hyperlipidemia 08/09/2015   Anxiety and depression 08/09/2015   Lower extremity numbness 08/09/2015    Past Surgical History:  Procedure Laterality Date   ABDOMINAL EXPOSURE N/A 11/16/2019   Procedure: ABDOMINAL EXPOSURE;  Surgeon: Marty Heck, MD;  Location: [place];  Service: Vascular;  Laterality: N/A;   ABDOMINAL HYSTERECTOMY     ANTERIOR LUMBAR FUSION N/A 11/16/2019   Procedure: LUMBAR FIVE-SACRAL ONE ANTERIOR LUMBAR INTERBODY FUSION;  Surgeon: Eustace Moore, MD;  Location: [facility];  Service: Neurosurgery;  Laterality: N/A;  anterior approach   BLADDER SUSPENSION  2005    BREAST BIOPSY Bilateral yrs ago in [place]   high risk lesions, not cancer per pt   BREAST CYST EXCISION Bilateral yrs ago in [place]   hard to see some excision areas   CHOLECYSTECTOMY     TONSILLECTOMY      OB History   No obstetric history on file.      Home Medications    Prior to Admission medications   Medication Sig Start Date End Date Taking? Authorizing Provider  acetaminophen (TYLENOL) 500 MG tablet Take 500 mg by mouth every 6 (six) hours as needed (for pain.).    [provider]  amLODipine (NORVASC) 5 MG tablet Take 1 tablet (5 mg total) by mouth daily. for blood pressure 07/11/22   Pleas Koch, NP  gabapentin (NEURONTIN) 300 MG capsule Take 1 capsule (300 mg total) by mouth in the morning and at bedtime. For pain. 09/12/21   Pleas Koch, NP  ibuprofen (ADVIL) 200 MG tablet Take 800 mg by mouth every 6 (six) hours as needed for headache or moderate pain.    [provider]  ISOtretinoin (ACCUTANE) 30 MG capsule Take 1 capsule (30 mg total) by mouth 2 (two) times daily. 07/11/22   Ralene Bathe, MD  mupirocin ointment (BACTROBAN) 2 % Apply 1 Application topically daily. 06/13/22   Ralene Bathe, MD  rosuvastatin ([place])  10 MG tablet Take 1 tablet (10 mg total) by mouth daily. for cholesterol. 07/11/22   Pleas Koch, NP  venlafaxine XR (EFFEXOR-XR) 150 MG 24 hr capsule TAKE 1 CAPSULE BY MOUTH DAILY  WITH BREAKFAST FOR ANXIETY AND  DEPRESSION TAKE WITH 37.5 MG 07/26/22   Pleas Koch, NP  venlafaxine XR (EFFEXOR-XR) 37.5 MG 24 hr capsule TAKE 1 CAPSULE BY MOUTH DAILY  WITH BREAKFAST FOR DEPRESSION  AND ANXIETY TAKE WITH 150 MG 07/27/22   Pleas Koch, NP    Family History Family History  Problem Relation Age of Onset   Hyperlipidemia Mother    Breast cancer Mother 25   Ovarian cancer Maternal Aunt    Lung cancer Maternal Uncle    Arthritis Maternal Grandmother    Arthritis Maternal Grandfather    Colon cancer Maternal  Grandfather    Arthritis Paternal Grandmother    Hypertension Paternal Grandmother    Diabetes Paternal Grandmother    Arthritis Paternal Grandfather    Diabetes Paternal Grandfather    Arthritis Cousin    Breast cancer Cousin        maternal side x2    Social History Social History   Tobacco Use   Smoking status: Never   Smokeless tobacco: Never  Vaping Use   Vaping Use: Never used  Substance Use Topics   Alcohol use: Yes    Alcohol/week: 0.0 standard drinks of alcohol   Drug use: No     Allergies   Codeine, Iodine, Latex, Lipitor [atorvastatin], Shellfish allergy, and Sulfa antibiotics   Review of Systems Review of Systems  Genitourinary:  Positive for hematuria.     Physical Exam Triage Vital Signs ED Triage Vitals [08/14/22 1857]  Enc Vitals Group     BP (!) 145/91     Pulse Rate 77     Resp 16     Temp      Temp src      SpO2 96 %     Weight      Height      Head Circumference      Peak Flow      Pain Score 0     Pain Loc      Pain Edu?      Excl. in [place]?    No data found.  Updated Vital Signs BP (!) 145/91 (BP Location: Left Arm)   Pulse 77   Resp 16   SpO2 96%   Visual Acuity Right Eye Distance:   Left Eye Distance:   Bilateral Distance:    Right Eye Near:   Left Eye Near:    Bilateral Near:     Physical Exam Vitals reviewed.  Constitutional:      Appearance: Normal appearance.  Neurological:     Mental Status: She is alert.      UC Treatments / Results  Labs (all labs ordered are listed, but only abnormal results are displayed) Labs Reviewed  POCT URINALYSIS DIP (MANUAL ENTRY) - Abnormal; Notable for the following components:      Result Value   Clarity, UA cloudy (*)    Spec Grav, UA >=1.030 (*)    Blood, UA large (*)    Protein Ur, POC >=300 (*)    Nitrite, UA Positive (*)    Leukocytes, UA Small (1+) (*)    All other components within normal limits    EKG   Radiology No results  found.  Procedures Procedures (including critical care time)  Medications  Ordered in UC Medications - No data to display  Initial Impression / Assessment and Plan / UC Course  I have reviewed the triage vital signs and the nursing notes.  Pertinent labs & imaging results that were available during my care of the patient were reviewed by me and considered in my medical decision making (see chart for details).   UA is not clearly positive for UTI.  Leukocytes are trace while nitrites are positive.  Will treat patient presumptively with Macrobid and send for culture to verify diagnosis and susceptibility.   Final Clinical Impressions(s) / UC Diagnoses   Final diagnoses:  None   Discharge Instructions   None    ED Prescriptions   None    PDMP not reviewed this encounter.   Rose Phi, [place] 08/14/22 1910

## 2022-08-15 ENCOUNTER — Ambulatory Visit: Payer: BLUE CROSS/BLUE SHIELD | Admitting: Dermatology

## 2022-08-15 LAB — URINE CULTURE: Culture: <10000 — AB

## 2022-08-21 ENCOUNTER — Observation Stay
Admission: EM | Admit: 2022-08-21 | Discharge: 2022-08-23 | Disposition: A | Discharge status: Home / Self Care | Payer: BLUE CROSS/BLUE SHIELD | Attending: Internal Medicine | Admitting: Internal Medicine

## 2022-08-21 ENCOUNTER — Emergency Department: Payer: BLUE CROSS/BLUE SHIELD

## 2022-08-21 ENCOUNTER — Other Ambulatory Visit: Payer: Self-pay

## 2022-08-21 ENCOUNTER — Encounter: Payer: Self-pay | Admitting: Emergency Medicine

## 2022-08-21 DIAGNOSIS — Z87442 Personal history of urinary calculi: Secondary | ICD-10-CM

## 2022-08-21 DIAGNOSIS — Z8261 Family history of arthritis: Secondary | ICD-10-CM | POA: Diagnosis not present

## 2022-08-21 DIAGNOSIS — A0811 Acute gastroenteropathy due to Norwalk agent: Secondary | ICD-10-CM

## 2022-08-21 DIAGNOSIS — E669 Obesity, unspecified: Secondary | ICD-10-CM | POA: Diagnosis not present

## 2022-08-21 DIAGNOSIS — Z91040 Latex allergy status: Secondary | ICD-10-CM

## 2022-08-21 DIAGNOSIS — Z833 Family history of diabetes mellitus: Secondary | ICD-10-CM

## 2022-08-21 DIAGNOSIS — F32A Depression, unspecified: Secondary | ICD-10-CM | POA: Diagnosis not present

## 2022-08-21 DIAGNOSIS — E66811 Obesity, class 1: Secondary | ICD-10-CM

## 2022-08-21 DIAGNOSIS — Z801 Family history of malignant neoplasm of trachea, bronchus and lung: Secondary | ICD-10-CM

## 2022-08-21 DIAGNOSIS — Z8041 Family history of malignant neoplasm of ovary: Secondary | ICD-10-CM | POA: Diagnosis not present

## 2022-08-21 DIAGNOSIS — R1084 Generalized abdominal pain: Secondary | ICD-10-CM

## 2022-08-21 DIAGNOSIS — L709 Acne, unspecified: Secondary | ICD-10-CM | POA: Diagnosis present

## 2022-08-21 DIAGNOSIS — E860 Dehydration: Secondary | ICD-10-CM | POA: Diagnosis not present

## 2022-08-21 DIAGNOSIS — E876 Hypokalemia: Secondary | ICD-10-CM | POA: Diagnosis not present

## 2022-08-21 DIAGNOSIS — Z87440 Personal history of urinary (tract) infections: Secondary | ICD-10-CM | POA: Diagnosis present

## 2022-08-21 DIAGNOSIS — E6609 Other obesity due to excess calories: Secondary | ICD-10-CM | POA: Diagnosis not present

## 2022-08-21 DIAGNOSIS — Z6830 Body mass index (BMI) 30.0-30.9, adult: Secondary | ICD-10-CM

## 2022-08-21 DIAGNOSIS — M5136 Other intervertebral disc degeneration, lumbar region: Secondary | ICD-10-CM | POA: Insufficient documentation

## 2022-08-21 DIAGNOSIS — R109 Unspecified abdominal pain: Principal | ICD-10-CM

## 2022-08-21 DIAGNOSIS — I10 Essential (primary) hypertension: Secondary | ICD-10-CM | POA: Diagnosis not present

## 2022-08-21 DIAGNOSIS — Z803 Family history of malignant neoplasm of breast: Secondary | ICD-10-CM

## 2022-08-21 DIAGNOSIS — G43909 Migraine, unspecified, not intractable, without status migrainosus: Secondary | ICD-10-CM | POA: Diagnosis present

## 2022-08-21 DIAGNOSIS — E785 Hyperlipidemia, unspecified: Secondary | ICD-10-CM | POA: Diagnosis not present

## 2022-08-21 DIAGNOSIS — R000 Tachycardia, unspecified: Secondary | ICD-10-CM | POA: Diagnosis not present

## 2022-08-21 DIAGNOSIS — Z79899 Other long term (current) drug therapy: Secondary | ICD-10-CM | POA: Diagnosis not present

## 2022-08-21 DIAGNOSIS — R112 Nausea with vomiting, unspecified: Secondary | ICD-10-CM | POA: Diagnosis present

## 2022-08-21 DIAGNOSIS — Z91013 Allergy to seafood: Secondary | ICD-10-CM

## 2022-08-21 DIAGNOSIS — Z888 Allergy status to other drugs, medicaments and biological substances status: Secondary | ICD-10-CM

## 2022-08-21 DIAGNOSIS — N281 Cyst of kidney, acquired: Secondary | ICD-10-CM | POA: Diagnosis not present

## 2022-08-21 DIAGNOSIS — Z8249 Family history of ischemic heart disease and other diseases of the circulatory system: Secondary | ICD-10-CM

## 2022-08-21 DIAGNOSIS — Z882 Allergy status to sulfonamides status: Secondary | ICD-10-CM

## 2022-08-21 DIAGNOSIS — L700 Acne vulgaris: Secondary | ICD-10-CM | POA: Diagnosis present

## 2022-08-21 DIAGNOSIS — Z800 Family history of malignant neoplasm of digestive organs: Secondary | ICD-10-CM

## 2022-08-21 DIAGNOSIS — F419 Anxiety disorder, unspecified: Secondary | ICD-10-CM | POA: Diagnosis present

## 2022-08-21 DIAGNOSIS — Z83438 Family history of other disorder of lipoprotein metabolism and other lipidemia: Secondary | ICD-10-CM

## 2022-08-21 DIAGNOSIS — K529 Noninfective gastroenteritis and colitis, unspecified: Principal | ICD-10-CM | POA: Insufficient documentation

## 2022-08-21 DIAGNOSIS — M519 Unspecified thoracic, thoracolumbar and lumbosacral intervertebral disc disorder: Secondary | ICD-10-CM | POA: Diagnosis present

## 2022-08-21 HISTORY — DX: Acute gastroenteropathy due to Norwalk agent: A08.11

## 2022-08-21 LAB — CBC WITH DIFFERENTIAL/PLATELET
Abs Immature Granulocytes: 0.03 10*3/uL (ref 0.00–0.07)
Basophils Absolute: 0 10*3/uL (ref 0.0–0.1)
Basophils Relative: 0 %
Eosinophils Absolute: 0.1 10*3/uL (ref 0.0–0.5)
Eosinophils Relative: 0 %
HCT: 41.8 % (ref 36.0–46.0)
Hemoglobin: 14.3 g/dL (ref 12.0–15.0)
Immature Granulocytes: 0 %
Lymphocytes Relative: 5 %
Lymphs Abs: 0.6 10*3/uL — ABNORMAL LOW (ref 0.7–4.0)
MCH: 30 pg (ref 26.0–34.0)
MCHC: 34.2 g/dL (ref 30.0–36.0)
MCV: 87.6 fL (ref 80.0–100.0)
Monocytes Absolute: 0.6 10*3/uL (ref 0.1–1.0)
Monocytes Relative: 5 %
Neutro Abs: 11.3 10*3/uL — ABNORMAL HIGH (ref 1.7–7.7)
Neutrophils Relative %: 90 %
Platelets: 326 10*3/uL (ref 150–400)
RBC: 4.77 MIL/uL (ref 3.87–5.11)
RDW: 13.3 % (ref 11.5–15.5)
WBC: 12.6 10*3/uL — ABNORMAL HIGH (ref 4.0–10.5)
nRBC: 0 % (ref 0.0–0.2)

## 2022-08-21 LAB — COMPREHENSIVE METABOLIC PANEL
ALT: 31 U/L (ref 0–44)
AST: 27 U/L (ref 15–41)
Albumin: 4.1 g/dL (ref 3.5–5.0)
Alkaline Phosphatase: 87 U/L (ref 38–126)
Anion gap: 8 (ref 5–15)
BUN: 23 mg/dL (ref 8–23)
CO2: 22 mmol/L (ref 22–32)
Calcium: 8.7 mg/dL — ABNORMAL LOW (ref 8.9–10.3)
Chloride: 106 mmol/L (ref 98–111)
Creatinine, Ser: 0.65 mg/dL (ref 0.44–1.00)
GFR, Estimated: >60 mL/min (ref >60)
Glucose, Bld: 181 mg/dL — ABNORMAL HIGH (ref 70–99)
Potassium: 3.7 mmol/L (ref 3.5–5.1)
Sodium: 136 mmol/L (ref 135–145)
Total Bilirubin: 1 mg/dL (ref 0.3–1.2)
Total Protein: 7.2 g/dL (ref 6.5–8.1)

## 2022-08-21 LAB — URINALYSIS, ROUTINE W REFLEX MICROSCOPIC
Bilirubin Urine: NEGATIVE
Glucose, UA: 50 mg/dL — AB
Ketones, ur: 20 mg/dL — AB
Nitrite: NEGATIVE
Protein, ur: 30 mg/dL — AB
Specific Gravity, Urine: 1.017 (ref 1.005–1.030)
pH: 6 (ref 5.0–8.0)

## 2022-08-21 LAB — LIPASE, BLOOD: Lipase: 25 U/L (ref 11–51)

## 2022-08-21 MED ORDER — ONDANSETRON HCL 4 MG/2ML IJ SOLN
4.0000 mg | Freq: Once | INTRAMUSCULAR | Status: AC
  Administered 2022-08-21: 4 mg via INTRAVENOUS
  Filled 2022-08-21: qty 2

## 2022-08-21 MED ORDER — ONDANSETRON HCL 4 MG PO TABS: 4.0000 mg | ORAL_TABLET | Freq: Four times a day (QID) | ORAL | Status: DC | PRN

## 2022-08-21 MED ORDER — IOHEXOL 300 MG/ML  SOLN
100.0000 mL | Freq: Once | INTRAMUSCULAR | Status: AC | PRN
  Administered 2022-08-21: 100 mL via INTRAVENOUS

## 2022-08-21 MED ORDER — KETOROLAC TROMETHAMINE 30 MG/ML IJ SOLN
15.0000 mg | Freq: Once | INTRAMUSCULAR | Status: AC
  Administered 2022-08-21: 15 mg via INTRAVENOUS
  Filled 2022-08-21: qty 1

## 2022-08-21 MED ORDER — ROSUVASTATIN CALCIUM 10 MG PO TABS
10.0000 mg | ORAL_TABLET | Freq: Every day | ORAL | Status: DC
  Administered 2022-08-21 – 2022-08-23 (×3): 10 mg via ORAL
  Filled 2022-08-21 (×4): qty 1

## 2022-08-21 MED ORDER — VENLAFAXINE HCL ER 150 MG PO CP24
150.0000 mg | ORAL_CAPSULE | Freq: Every day | ORAL | Status: DC
  Administered 2022-08-21 – 2022-08-23 (×3): 150 mg via ORAL
  Filled 2022-08-21 (×4): qty 1

## 2022-08-21 MED ORDER — HYDRALAZINE HCL 20 MG/ML IJ SOLN: 5.0000 mg | INTRAMUSCULAR | Status: DC | PRN

## 2022-08-21 MED ORDER — ACETAMINOPHEN 650 MG RE SUPP: 650.0000 mg | Freq: Four times a day (QID) | RECTAL | Status: DC | PRN

## 2022-08-21 MED ORDER — ACETAMINOPHEN 325 MG PO TABS: 650.0000 mg | ORAL_TABLET | Freq: Four times a day (QID) | ORAL | Status: DC | PRN

## 2022-08-21 MED ORDER — METOCLOPRAMIDE HCL 5 MG/ML IJ SOLN
10.0000 mg | Freq: Four times a day (QID) | INTRAMUSCULAR | Status: DC | PRN
  Administered 2022-08-21: 10 mg via INTRAVENOUS
  Filled 2022-08-21: qty 2

## 2022-08-21 MED ORDER — ENOXAPARIN SODIUM 60 MG/0.6ML IJ SOSY
45.0000 mg | PREFILLED_SYRINGE | INTRAMUSCULAR | Status: DC
  Administered 2022-08-21 – 2022-08-22 (×2): 45 mg via SUBCUTANEOUS
  Filled 2022-08-21 (×2): qty 0.6

## 2022-08-21 MED ORDER — METOCLOPRAMIDE HCL 5 MG/ML IJ SOLN
10.0000 mg | Freq: Once | INTRAMUSCULAR | Status: AC
  Administered 2022-08-21: 10 mg via INTRAVENOUS
  Filled 2022-08-21: qty 2

## 2022-08-21 MED ORDER — SODIUM CHLORIDE 0.9% FLUSH: 3.0000 mL | Freq: Two times a day (BID) | INTRAVENOUS | Status: DC

## 2022-08-21 MED ORDER — KETOROLAC TROMETHAMINE 30 MG/ML IJ SOLN
30.0000 mg | Freq: Four times a day (QID) | INTRAMUSCULAR | Status: DC | PRN
  Administered 2022-08-21 (×2): 30 mg via INTRAVENOUS
  Filled 2022-08-21 (×2): qty 1

## 2022-08-21 MED ORDER — LACTATED RINGERS IV SOLN: INTRAVENOUS | Status: DC

## 2022-08-21 MED ORDER — VENLAFAXINE HCL ER 37.5 MG PO CP24
37.5000 mg | ORAL_CAPSULE | Freq: Every day | ORAL | Status: DC
  Administered 2022-08-21 – 2022-08-23 (×3): 37.5 mg via ORAL
  Filled 2022-08-21 (×4): qty 1

## 2022-08-21 MED ORDER — LACTATED RINGERS IV BOLUS
1000.0000 mL | Freq: Once | INTRAVENOUS | Status: AC
  Administered 2022-08-21: 1000 mL via INTRAVENOUS

## 2022-08-21 MED ORDER — HYDROMORPHONE HCL 1 MG/ML IJ SOLN
1.0000 mg | Freq: Once | INTRAMUSCULAR | Status: AC
  Administered 2022-08-21: 1 mg via INTRAVENOUS
  Filled 2022-08-21: qty 1

## 2022-08-21 MED ORDER — AMLODIPINE BESYLATE 5 MG PO TABS
5.0000 mg | ORAL_TABLET | Freq: Every day | ORAL | Status: DC
  Administered 2022-08-22 – 2022-08-23 (×2): 5 mg via ORAL
  Filled 2022-08-21 (×2): qty 1

## 2022-08-21 MED ORDER — GABAPENTIN 300 MG PO CAPS
300.0000 mg | ORAL_CAPSULE | Freq: Two times a day (BID) | ORAL | Status: DC
  Administered 2022-08-21 – 2022-08-23 (×5): 300 mg via ORAL
  Filled 2022-08-21 (×5): qty 1

## 2022-08-21 MED ORDER — OXYCODONE HCL 5 MG PO TABS: 5.0000 mg | ORAL_TABLET | ORAL | Status: DC | PRN

## 2022-08-21 MED ORDER — SODIUM CHLORIDE 0.9 % IV SOLN
2.0000 g | Freq: Once | INTRAVENOUS | Status: AC
  Administered 2022-08-21: 2 g via INTRAVENOUS
  Filled 2022-08-21: qty 20

## 2022-08-21 MED ORDER — ONDANSETRON HCL 4 MG/2ML IJ SOLN: 4.0000 mg | Freq: Four times a day (QID) | INTRAMUSCULAR | Status: DC | PRN

## 2022-08-21 NOTE — ED Triage Notes (Signed)
Patient ambulatory to triage with steady gait, actively vomiting; reports pain to mid back radiating around beneath ribs accomp by N/V; st hx of same with kidney stone

## 2022-08-21 NOTE — ED Provider Notes (Signed)
62 yo F with h/o nephrolithiasis, HLD, anxiety, depression, here with back pain radiating to abdomen. Diagnosed with UTI last week and is finishing a course of ABX. CT A/P pending at this time. Pt in moderate pain but is o/w HDS.   CT shows fluid-filled but nondilated small and large bowel thorughout the abdomen. No other acute process.  On exam pt has bilateral flank pain concerning for pyelonephritis, and UA shows pyuria, rare bacteria despite being on Macrobid, with leukocytosis and tachycardia. Technically pt meets sepsis criteria. Cx sent, will start IV Rocephin. Remains tachycardic after 2L fluids, pain control, and is still nauseous. Will admit.   Duffy Bruce, MD 08/21/22 [phone]

## 2022-08-21 NOTE — ED Provider Notes (Signed)
[facility] Provider Note    Event Date/Time   First MD Initiated Contact with Patient 08/21/22 [phone]     (approximate)   History   Back Pain   HPI  Elaine Young is a 62 y.o. female medical history of lumbar disc disease, kidney stone, hyperlipidemia, depression and anxiety who presents because of flank and abdominal pain.  Symptoms started around 10 PM last night.  Pain is located in the bilateral low back rating around to the abdomen.  She has had more episodes of emesis and she can count and started having diarrhea as well.  Has had chills no fever denies urinary symptoms.  She says that she think she has a kidney stone however her kidney stone pain is typically been unilateral not bilateral.  Has had history of hysterectomy and gallbladder removal.     Past Medical History:  Diagnosis Date   Carpal tunnel syndrome    Cervical disc disorder    Complication of anesthesia    Essential hypertension    Functional incontinence    Generalized anxiety disorder    Hyperlipidemia    Lower extremity numbness    Lumbar disc disease    Migraines    PONV (postoperative nausea and vomiting)    per pt, she usually gets zofran and scopalamine patch    Renal disorder    kidney stone   Seasonal allergies    Vitamin D deficiency     Patient Active Problem List   Diagnosis Date Noted   Acne vulgaris 09/12/2021   Post-viral cough syndrome 08/02/2021   RLQ abdominal pain 10/16/2020   Cough 08/03/2020   S/P lumbar fusion 11/16/2019   Palpitations 12/17/2018   Chronic lower back pain 06/02/2018   Family history of brain aneurysm 04/03/2016   Preventative health care 04/03/2016   Prediabetes 04/03/2016   Essential hypertension 08/09/2015   Hyperlipidemia 08/09/2015   Anxiety and depression 08/09/2015   Lower extremity numbness 08/09/2015     Physical Exam  Triage Vital Signs: ED Triage Vitals  Enc Vitals Group     BP 08/21/22 0508 (!) 163/84      Pulse Rate 08/21/22 0508 (!) 117     Resp 08/21/22 0508 20     Temp 08/21/22 0508 (!) 97.5 F (36.4 C)     Temp src --      SpO2 08/21/22 0508 99 %     Weight 08/21/22 0454 200 lb (90.7 kg)     Height 08/21/22 0454 5' 8"$  (1.727 m)     Head Circumference --      Peak Flow --      Pain Score 08/21/22 0454 8     Pain Loc --      Pain Edu? --      Excl. in [place]? --     Most recent vital signs: Vitals:   08/21/22 0508  BP: (!) 163/84  Pulse: (!) 117  Resp: 20  Temp: (!) 97.5 F (36.4 C)  SpO2: 99%     General: Awake, no distress.  Dry mucous membranes CV:  Good peripheral perfusion.  Resp:  Normal effort.  Abd:  No distention.  Tender to palpation in epigastrium and periumbilical region with no guarding minimal tenderness in bilateral lower quadrants Neuro:             Awake, Alert, Oriented x 3  Other:  No CVA tenderness   ED Results / Procedures / Treatments  Labs (all labs ordered  are listed, but only abnormal results are displayed) Labs Reviewed  CBC WITH DIFFERENTIAL/PLATELET - Abnormal; Notable for the following components:      Result Value   WBC 12.6 (*)    Neutro Abs 11.3 (*)    Lymphs Abs 0.6 (*)    All other components within normal limits  URINALYSIS, ROUTINE W REFLEX MICROSCOPIC  COMPREHENSIVE METABOLIC PANEL  LIPASE, BLOOD     EKG     RADIOLOGY    PROCEDURES:  Critical Care performed: No  Procedures     MEDICATIONS ORDERED IN ED: Medications  HYDROmorphone (DILAUDID) injection 1 mg (has no administration in time range)  HYDROmorphone (DILAUDID) injection 1 mg (1 mg Intravenous Given 08/21/22 0526)  ondansetron (ZOFRAN) injection 4 mg (4 mg Intravenous Given 08/21/22 0526)  lactated ringers bolus 1,000 mL (1,000 mLs Intravenous New Bag/Given 08/21/22 0525)  metoCLOPramide (REGLAN) injection 10 mg (10 mg Intravenous Given 08/21/22 0643)     IMPRESSION / MDM / Caro / ED COURSE  I reviewed the triage vital signs and the  nursing notes.                              Patient's presentation is most consistent with acute complicated illness / injury requiring diagnostic workup.  Differential diagnosis includes, but is not limited to, nephrolithiasis, pancreatitis, gastritis, choledocholithiasis, bowel obstruction, pyelonephritis  Is a 62 year old female presenting with bilateral flank abdominal pain that started last night.  It is associated with nausea vomiting and diarrhea and chills.  On arrival she is hypertensive tachycardic.  Does look somewhat dry but is nontoxic-appearing.  Abdomen is tender primarily in the epigastric and periumbilical region but abdominal exam overall is benign without peritoneal findings.  She has no clear CVA tenderness.  Patient tells me think she has a kidney stones this feels similar however her pain is bilateral kidney stone pain is typically been unilateral.  Plan to give bolus of fluid Dilaudid and Zofran.  Will obtain labs and urinalysis.  Given degree of patient's discomfort we will obtain a CT of the abdomen pelvis with contrast.  Radiology tech informed me that patient has allergy to iodine.  I talked with patient she is only ever had a reaction to shellfish but has never had reaction to IV contrast.  I there is no significant association between shellfish allergy and contrast allergy so I am okay with proceeding without any pretreatment.  Labs so far notable for leukocytosis of 12.  On reassessment patient has ongoing pain.  CMP and lipase unfortunately were discontinued studies are still in process.  At time signout patient is pending these labs urinalysis and CT.       FINAL CLINICAL IMPRESSION(S) / ED DIAGNOSES   Final diagnoses:  Flank pain  Generalized abdominal pain     Rx / DC Orders   ED Discharge Orders     None        Note:  This document was prepared using Dragon voice recognition software and may include unintentional dictation errors.   Rada Hay, MD 08/21/22 [phone]

## 2022-08-21 NOTE — Progress Notes (Signed)
Anticoagulation monitoring(Lovenox):  61yo  F ordered Lovenox 40 mg Q24h    Filed Weights   08/21/22 0454 08/21/22 0508  Weight: 90.7 kg (200 lb) 90.7 kg (200 lb)   BMI 30.41   Lab Results  Component Value Date   CREATININE 0.65 08/21/2022   CREATININE 0.67 09/12/2021   CREATININE 0.71 10/18/2020   Estimated Creatinine Clearance: 85.9 mL/min (by C-G formula based on SCr of 0.65 mg/dL). Hemoglobin & Hematocrit     Component Value Date/Time   HGB 14.3 08/21/2022 0521   HCT 41.8 08/21/2022 0521     Per Protocol for Patient with estCrcl > 30 ml/min and BMI > 30, will transition to Lovenox 0.5 mg/kg Q24 h   wt 90.7 kg       Chinita Greenland PharmD Clinical Pharmacist 08/21/2022

## 2022-08-21 NOTE — Plan of Care (Signed)

## 2022-08-21 NOTE — TOC Progression Note (Signed)
Transition of Care [facility]) - Progression Note    Patient Details  Name: Elaine Young MRN: [id number] Date of Birth: 1960-09-28  Transition of Care [facility]) CM/SW [place], RN Phone Number: 08/21/2022, 4:19 PM  Clinical Narrative:     Transition of Care [facility]) Screening Note   Patient Details  Name: Elaine Young Date of Birth: March 06, 1961   Transition of Care [facility]) CM/SW Contact:    Conception Oms, RN Phone Number: 08/21/2022, 4:19 PM    Transition of Care Department [facility]) has reviewed patient and no TOC needs have been identified at this time. We will continue to monitor patient advancement through interdisciplinary progression rounds. If new patient transition needs arise, please place a TOC consult.          Expected Discharge Plan and Services                                               Social Determinants of Health (SDOH) Interventions SDOH Screenings   Food Insecurity: No Food Insecurity (08/21/2022)  Housing: Low Risk  (08/21/2022)  Transportation Needs: No Transportation Needs (08/21/2022)  Utilities: Not At Risk (08/21/2022)  Depression (PHQ2-9): Low Risk  (09/12/2021)  Tobacco Use: Low Risk  (08/21/2022)    Readmission Risk Interventions     No data to display

## 2022-08-21 NOTE — H&P (Signed)
History and Physical    Patient: Elaine Young [id number] DOB: 1960/09/02 DOA: 08/21/2022 DOS: the patient was seen and examined on 08/21/2022 PCP: Pleas Koch, NP  Patient coming from: Home - lives with husband; NOK: Husband, [phone]   Chief Complaint: Flank pain  HPI: Elaine Young is a 62 y.o. female with medical history significant of HTN, anxiety, HLD, and nephrolithiasis presenting with flank pain.  She was last seen at [facility] on 2/13 with hematuria from probably UTI (negative culture), treated with Macrobid. She reported that she started last night with n/v/d.  Her back is killing her, still nauseated.  She had about 5 episodes of n/v/d.  She was given 2 meds for nausea - the 2nd dose worked better than the first and third (Zofran didn't work).  No sick contacts.  She and her husband ate the same thing.  Last vomiting/diarrhea was in the waiting room.   She was having dysuria prior, some back discomfort.  She was still having some dysuria yesterday.    ER Course:  ?Partially treated pyelo.  Diagnosed with UTI on 2/13 at [facility], placed on Marobid. Now with B back pain, n/v/d.  Dehydrated, +ketonuria.     Review of Systems: As mentioned in the history of present illness. All other systems reviewed and are negative. Past Medical History:  Diagnosis Date   Carpal tunnel syndrome    Cervical disc disorder    Complication of anesthesia    Essential hypertension    Functional incontinence    Generalized anxiety disorder    Hyperlipidemia    Lower extremity numbness    Lumbar disc disease    Migraines    PONV (postoperative nausea and vomiting)    per pt, she usually gets zofran and scopalamine patch    Renal disorder    kidney stone   Seasonal allergies    Vitamin D deficiency    Past Surgical History:  Procedure Laterality Date   ABDOMINAL EXPOSURE N/A 11/16/2019   Procedure: ABDOMINAL EXPOSURE;  Surgeon: Marty Heck, MD;  Location: [place];   Service: Vascular;  Laterality: N/A;   ABDOMINAL HYSTERECTOMY     ANTERIOR LUMBAR FUSION N/A 11/16/2019   Procedure: LUMBAR FIVE-SACRAL ONE ANTERIOR LUMBAR INTERBODY FUSION;  Surgeon: Eustace Moore, MD;  Location: [place];  Service: Neurosurgery;  Laterality: N/A;  anterior approach   BLADDER SUSPENSION  2005   BREAST BIOPSY Bilateral yrs ago in [place]   high risk lesions, not cancer per pt   BREAST CYST EXCISION Bilateral yrs ago in [place]   hard to see some excision areas   CHOLECYSTECTOMY     TONSILLECTOMY     Social History:  reports that she has never smoked. She has never used smokeless tobacco. She reports current alcohol use. She reports that she does not use drugs.  Allergies  Allergen Reactions   Codeine Nausea And Vomiting   Latex Rash   Lipitor [Atorvastatin] Rash   Shellfish Allergy Swelling and Rash   Sulfa Antibiotics Rash    Family History  Problem Relation Age of Onset   Hyperlipidemia Mother    Breast cancer Mother 17   Ovarian cancer Maternal Aunt    Lung cancer Maternal Uncle    Arthritis Maternal Grandmother    Arthritis Maternal Grandfather    Colon cancer Maternal Grandfather    Arthritis Paternal Grandmother    Hypertension Paternal Grandmother    Diabetes Paternal Grandmother    Arthritis Paternal [employer] officer  Diabetes Paternal Grandfather    Arthritis Cousin    Breast cancer Cousin        maternal side x2    Prior to Admission medications   Medication Sig Start Date End Date Taking? Authorizing Provider  acetaminophen (TYLENOL) 500 MG tablet Take 500 mg by mouth every 6 (six) hours as needed (for pain.).    [provider]  amLODipine (NORVASC) 5 MG tablet Take 1 tablet (5 mg total) by mouth daily. for blood pressure 07/11/22   Pleas Koch, NP  gabapentin (NEURONTIN) 300 MG capsule Take 1 capsule (300 mg total) by mouth in the morning and at bedtime. For pain. 09/12/21   Pleas Koch, NP  ibuprofen (ADVIL) 200 MG tablet Take 800 mg  by mouth every 6 (six) hours as needed for headache or moderate pain.    [provider]  ISOtretinoin (ACCUTANE) 30 MG capsule Take 1 capsule (30 mg total) by mouth 2 (two) times daily. 07/11/22   Ralene Bathe, MD  mupirocin ointment (BACTROBAN) 2 % Apply 1 Application topically daily. 06/13/22   Ralene Bathe, MD  nitrofurantoin, macrocrystal-monohydrate, (MACROBID) 100 MG capsule Take 1 capsule (100 mg total) by mouth 2 (two) times daily. 08/14/22   Immordino, Annie Main, FNP  rosuvastatin (CRESTOR) 10 MG tablet Take 1 tablet (10 mg total) by mouth daily. for cholesterol. 07/11/22   Pleas Koch, NP  venlafaxine XR (EFFEXOR-XR) 150 MG 24 hr capsule TAKE 1 CAPSULE BY MOUTH DAILY  WITH BREAKFAST FOR ANXIETY AND  DEPRESSION TAKE WITH 37.5 MG 07/26/22   Pleas Koch, NP  venlafaxine XR (EFFEXOR-XR) 37.5 MG 24 hr capsule TAKE 1 CAPSULE BY MOUTH DAILY  WITH BREAKFAST FOR DEPRESSION  AND ANXIETY TAKE WITH 150 MG 07/27/22   Pleas Koch, NP    Physical Exam: Vitals:   08/21/22 0943 08/21/22 1045 08/21/22 1057 08/21/22 1207  BP: 137/70   (!) 149/64  Pulse: (!) 103 (!) 101 (!) 108 (!) 107  Resp: (!) 22 (!) 22 18 (!) 21  Temp:    98.1 F (36.7 C)  SpO2: 93% 96% 95% 95%  Weight:      Height:       General:  Appears calm and comfortable and is in NAD Eyes:   EOMI, normal lids, iris ENT:  grossly normal hearing, lips & tongue, mildly dry mm Neck:  no LAD, masses or thyromegaly Cardiovascular:  RR with mild tachycardia, no m/r/g. No LE edema.  Respiratory:   CTA bilaterally with no wheezes/rales/rhonchi.  Normal  to mildly increased respiratory effort. Abdomen:  soft, diffusely mildly TTP but worst in LLQ and midepigastric regions, ND, hypoactive BS Skin:  no rash or induration seen on limited exam Musculoskeletal:  grossly normal tone BUE/BLE, good ROM, no bony abnormality Psychiatric:  blunted mood and affect, speech fluent and appropriate, AOx3 Neurologic:  CN 2-12  grossly intact, moves all extremities in coordinated fashion   Radiological Exams on Admission: Independently reviewed - see discussion in A/P where applicable  CT ABDOMEN PELVIS W CONTRAST  Result Date: 08/21/2022 CLINICAL DATA:  62 year old female with abdomen and flank pain, vomiting. History of kidney stone. EXAM: CT ABDOMEN AND PELVIS WITH CONTRAST TECHNIQUE: Multidetector CT imaging of the abdomen and pelvis was performed using the standard protocol following bolus administration of intravenous contrast. RADIATION DOSE REDUCTION: This exam was performed according to the departmental dose-optimization program which includes automated exposure control, adjustment of the mA and/or kV according to patient  size and/or use of iterative reconstruction technique. CONTRAST:  150m OMNIPAQUE IOHEXOL 300 MG/ML  SOLN COMPARISON:  CT Abdomen and Pelvis 10/18/2020. FINDINGS: Lower chest: Lower lung volumes compared to 2022 with chronic lower lobe scarring or atelectasis in both costophrenic angles. No pericardial or pleural effusion. Hepatobiliary: Chronically absent gallbladder. Evidence of hepatic steatosis but otherwise negative liver. Pancreas: Negative. Spleen: Negative. Adrenals/Urinary Tract: Normal adrenal glands. Nonobstructed kidneys with symmetric renal enhancement and contrast excretion. Small chronic right renal midpole cyst is simple and stable (no follow-up imaging recommended). No nephrolithiasis. Decompressed ureters and urinary bladder. Stomach/Bowel: Negative rectum. Chronic diverticulosis of the distal descending colon and throughout the sigmoid colon in the pelvis. But no convincing active inflammation. Superimposed fluid in the large bowel to the level of the descending colon. Occasional diverticula in the transverse colon and right colon with no active inflammation. No large bowel wall thickening identified. Cecum is in the midline. Suspected midline gas containing appendix on coronal image 42  without inflammation. Decompressed but fluid-filled terminal ileum and distal small bowel. And fluid-filled nondilated small bowel throughout the abdomen and pelvis. Similar fluid in the stomach and duodenum. Small 2nd portion duodenal diverticulum series 2, image 35 with no active inflammation. No free air, free fluid, or mesenteric stranding identified. Vascular/Lymphatic: Aortoiliac calcified atherosclerosis. Normal caliber abdominal aorta and the major arterial structures remain patent. Portal venous system is patent. No lymphadenopathy identified. Reproductive: Chronically absent uterus. Ovaries are stable and within normal limits. Other: No pelvis free fluid. Musculoskeletal: Chronic L5-S1 anterior interbody fusion. No acute osseous abnormality identified. IMPRESSION: 1. Fluid-filled but nondilated small and large bowel throughout the abdomen compatible with enteritis/diarrhea. But no discrete bowel inflammation. Diverticulosis of the large bowel with no active inflammation. 2. No other acute or inflammatory process identified in the abdomen or pelvis. No nephrolithiasis. 3. Mild Aortic Atherosclerosis (ICD10-I70.0). Electronically Signed   By: HGenevie AnnM.D.   On: 08/21/2022 07:51    EKG: Independently reviewed.  Sinus tachycardia with rate 115; nonspecific ST changes with no evidence of acute ischemia   Labs on Admission: I have personally reviewed the available labs and imaging studies at the time of the admission.  Pertinent labs:    Glucose 181 WBC 12.6 UA: 50 glucose, small Hgb, 20 ketones, small LE, 30 protein, rare bacteria Urine culture pending   Assessment and Plan: Principal Problem:   Gastroenteritis Active Problems:   Essential hypertension   Hyperlipidemia   Anxiety and depression   Acne vulgaris   Recent urinary tract infection   Class 1 obesity due to excess calories with body mass index (BMI) of 30.0 to 30.9 in adult    Gastroenteritis -Patient presenting with acute  onset of n/v/d overnight -No known sick contacts or concern for ingestion -CT consistent with gastroenteritis -Will observe on telemetry given persistent tachycardia -LR at 75 cc/hr -Clear liquid diet, advanced to carb modified BRAT diet if tolerated -Zofran/Reglan prn -Toradol requested for pain -Anticipate d/c tomorrow assuming ongoing clinical improvement   Recent UTI -Urinary symptoms may still be ongoing -c/o B flank pain - this would generally be inconsistent with pyelo -Treated with Macrobid -Today's UA is more suggestive of dehydration -CT without evidence of bladder or kidney infection -Urine culture pending -Will not give additional antibiotics at this time  HTN -Continue amlodipine  HLD -Continue atorvastatin  Anxiety -Continue Effexor, Neurontin  Acne -Hold Accutane while inpatient  Obesity -Body mass index is 30.41 kg/m..  -Weight loss should be encouraged -Outpatient PCP/bariatric  medicine f/u encouraged     Advance Care Planning:   Code Status: Full Code - Code status was discussed with the patient and/or family at the time of admission.  The patient would want to receive full resuscitative measures at this time.   Consults: None  DVT Prophylaxis: Lovenox  Family Communication: Husband was present throughout evaluation  Severity of Illness: The appropriate patient status for this patient is OBSERVATION. Observation status is judged to be reasonable and necessary in order to provide the required intensity of service to ensure the patient's safety. The patient's presenting symptoms, physical exam findings, and initial radiographic and laboratory data in the context of their medical condition is felt to place them at decreased risk for further clinical deterioration. Furthermore, it is anticipated that the patient will be medically stable for discharge from the hospital within 2 midnights of admission.   Author: Karmen Bongo, MD 08/21/2022 12:38  PM  For on call review [email or web address].

## 2022-08-22 ENCOUNTER — Ambulatory Visit: Payer: BLUE CROSS/BLUE SHIELD | Admitting: Dermatology

## 2022-08-22 DIAGNOSIS — Z83438 Family history of other disorder of lipoprotein metabolism and other lipidemia: Secondary | ICD-10-CM | POA: Diagnosis not present

## 2022-08-22 DIAGNOSIS — Z6830 Body mass index (BMI) 30.0-30.9, adult: Secondary | ICD-10-CM | POA: Diagnosis not present

## 2022-08-22 DIAGNOSIS — Z882 Allergy status to sulfonamides status: Secondary | ICD-10-CM | POA: Diagnosis not present

## 2022-08-22 DIAGNOSIS — E6609 Other obesity due to excess calories: Secondary | ICD-10-CM | POA: Diagnosis present

## 2022-08-22 DIAGNOSIS — K529 Noninfective gastroenteritis and colitis, unspecified: Secondary | ICD-10-CM | POA: Diagnosis not present

## 2022-08-22 DIAGNOSIS — F419 Anxiety disorder, unspecified: Secondary | ICD-10-CM | POA: Diagnosis present

## 2022-08-22 DIAGNOSIS — Z91040 Latex allergy status: Secondary | ICD-10-CM | POA: Diagnosis not present

## 2022-08-22 DIAGNOSIS — Z8041 Family history of malignant neoplasm of ovary: Secondary | ICD-10-CM | POA: Diagnosis not present

## 2022-08-22 DIAGNOSIS — Z91013 Allergy to seafood: Secondary | ICD-10-CM | POA: Diagnosis not present

## 2022-08-22 DIAGNOSIS — Z801 Family history of malignant neoplasm of trachea, bronchus and lung: Secondary | ICD-10-CM | POA: Diagnosis not present

## 2022-08-22 DIAGNOSIS — E876 Hypokalemia: Secondary | ICD-10-CM | POA: Diagnosis present

## 2022-08-22 DIAGNOSIS — A0811 Acute gastroenteropathy due to Norwalk agent: Secondary | ICD-10-CM | POA: Diagnosis present

## 2022-08-22 DIAGNOSIS — Z803 Family history of malignant neoplasm of breast: Secondary | ICD-10-CM | POA: Diagnosis not present

## 2022-08-22 DIAGNOSIS — R112 Nausea with vomiting, unspecified: Secondary | ICD-10-CM

## 2022-08-22 DIAGNOSIS — Z87440 Personal history of urinary (tract) infections: Secondary | ICD-10-CM | POA: Diagnosis not present

## 2022-08-22 DIAGNOSIS — E860 Dehydration: Secondary | ICD-10-CM | POA: Diagnosis present

## 2022-08-22 DIAGNOSIS — Z8249 Family history of ischemic heart disease and other diseases of the circulatory system: Secondary | ICD-10-CM | POA: Diagnosis not present

## 2022-08-22 DIAGNOSIS — Z833 Family history of diabetes mellitus: Secondary | ICD-10-CM | POA: Diagnosis not present

## 2022-08-22 DIAGNOSIS — I10 Essential (primary) hypertension: Secondary | ICD-10-CM | POA: Diagnosis present

## 2022-08-22 DIAGNOSIS — Z8261 Family history of arthritis: Secondary | ICD-10-CM | POA: Diagnosis not present

## 2022-08-22 DIAGNOSIS — Z87442 Personal history of urinary calculi: Secondary | ICD-10-CM | POA: Diagnosis not present

## 2022-08-22 DIAGNOSIS — L709 Acne, unspecified: Secondary | ICD-10-CM | POA: Diagnosis present

## 2022-08-22 DIAGNOSIS — F32A Depression, unspecified: Secondary | ICD-10-CM | POA: Diagnosis present

## 2022-08-22 DIAGNOSIS — Z888 Allergy status to other drugs, medicaments and biological substances status: Secondary | ICD-10-CM | POA: Diagnosis not present

## 2022-08-22 DIAGNOSIS — Z800 Family history of malignant neoplasm of digestive organs: Secondary | ICD-10-CM | POA: Diagnosis not present

## 2022-08-22 DIAGNOSIS — E785 Hyperlipidemia, unspecified: Secondary | ICD-10-CM | POA: Diagnosis present

## 2022-08-22 HISTORY — DX: Nausea with vomiting, unspecified: R11.2

## 2022-08-22 LAB — CBC
HCT: 35.6 % — ABNORMAL LOW (ref 36.0–46.0)
Hemoglobin: 12 g/dL (ref 12.0–15.0)
MCH: 29.6 pg (ref 26.0–34.0)
MCHC: 33.7 g/dL (ref 30.0–36.0)
MCV: 87.7 fL (ref 80.0–100.0)
Platelets: 209 10*3/uL (ref 150–400)
RBC: 4.06 MIL/uL (ref 3.87–5.11)
RDW: 13.4 % (ref 11.5–15.5)
WBC: 5.7 10*3/uL (ref 4.0–10.5)
nRBC: 0 % (ref 0.0–0.2)

## 2022-08-22 LAB — GASTROINTESTINAL PANEL BY PCR, STOOL (REPLACES STOOL CULTURE)

## 2022-08-22 LAB — COMPREHENSIVE METABOLIC PANEL
ALT: 31 U/L (ref 0–44)
AST: 28 U/L (ref 15–41)
Albumin: 3.3 g/dL — ABNORMAL LOW (ref 3.5–5.0)
Alkaline Phosphatase: 71 U/L (ref 38–126)
Anion gap: 8 (ref 5–15)
BUN: 10 mg/dL (ref 8–23)
CO2: 22 mmol/L (ref 22–32)
Calcium: 7.9 mg/dL — ABNORMAL LOW (ref 8.9–10.3)
Chloride: 102 mmol/L (ref 98–111)
Creatinine, Ser: 0.55 mg/dL (ref 0.44–1.00)
GFR, Estimated: >60 mL/min (ref >60)
Glucose, Bld: 124 mg/dL — ABNORMAL HIGH (ref 70–99)
Potassium: 2.8 mmol/L — ABNORMAL LOW (ref 3.5–5.1)
Sodium: 132 mmol/L — ABNORMAL LOW (ref 135–145)
Total Bilirubin: 0.7 mg/dL (ref 0.3–1.2)
Total Protein: 6.2 g/dL — ABNORMAL LOW (ref 6.5–8.1)

## 2022-08-22 LAB — BASIC METABOLIC PANEL
Anion gap: 6 (ref 5–15)
BUN: 7 mg/dL — ABNORMAL LOW (ref 8–23)
CO2: 25 mmol/L (ref 22–32)
Calcium: 8.5 mg/dL — ABNORMAL LOW (ref 8.9–10.3)
Chloride: 104 mmol/L (ref 98–111)
Creatinine, Ser: 0.57 mg/dL (ref 0.44–1.00)
GFR, Estimated: >60 mL/min (ref >60)
Glucose, Bld: 100 mg/dL — ABNORMAL HIGH (ref 70–99)
Potassium: 3.8 mmol/L (ref 3.5–5.1)
Sodium: 135 mmol/L (ref 135–145)

## 2022-08-22 LAB — URINE CULTURE: Culture: NO GROWTH

## 2022-08-22 LAB — HIV ANTIBODY (ROUTINE TESTING W REFLEX): HIV Screen 4th Generation wRfx: NONREACTIVE

## 2022-08-22 LAB — C DIFFICILE QUICK SCREEN W PCR REFLEX
C Diff antigen: NEGATIVE
C Diff interpretation: NOT DETECTED
C Diff toxin: NEGATIVE

## 2022-08-22 LAB — MAGNESIUM: Magnesium: 1.7 mg/dL (ref 1.7–2.4)

## 2022-08-22 MED ORDER — LACTATED RINGERS IV BOLUS
500.0000 mL | Freq: Once | INTRAVENOUS | Status: AC
  Administered 2022-08-22: 500 mL via INTRAVENOUS

## 2022-08-22 MED ORDER — POTASSIUM CHLORIDE 10 MEQ/100ML IV SOLN
10.0000 meq | INTRAVENOUS | Status: AC
  Administered 2022-08-22 (×3): 10 meq via INTRAVENOUS
  Filled 2022-08-22 (×3): qty 100

## 2022-08-22 MED ORDER — POTASSIUM CHLORIDE CRYS ER 20 MEQ PO TBCR
40.0000 meq | EXTENDED_RELEASE_TABLET | ORAL | Status: AC
  Administered 2022-08-22 (×2): 40 meq via ORAL
  Filled 2022-08-22 (×2): qty 2

## 2022-08-22 MED ORDER — LACTATED RINGERS IV BOLUS
1000.0000 mL | Freq: Once | INTRAVENOUS | Status: AC
  Administered 2022-08-22: 1000 mL via INTRAVENOUS

## 2022-08-22 MED ORDER — BISMUTH SUBSALICYLATE 262 MG/15ML PO SUSP
30.0000 mL | ORAL | Status: DC | PRN
  Administered 2022-08-22: 30 mL via ORAL
  Filled 2022-08-22: qty 118

## 2022-08-22 MED ORDER — LOPERAMIDE HCL 2 MG PO CAPS
2.0000 mg | ORAL_CAPSULE | ORAL | Status: DC | PRN
  Administered 2022-08-22 (×3): 2 mg via ORAL
  Filled 2022-08-22 (×3): qty 1

## 2022-08-22 MED ORDER — POTASSIUM CHLORIDE CRYS ER 20 MEQ PO TBCR
20.0000 meq | EXTENDED_RELEASE_TABLET | Freq: Once | ORAL | Status: AC
  Administered 2022-08-22: 20 meq via ORAL
  Filled 2022-08-22: qty 1

## 2022-08-22 MED ORDER — POTASSIUM CHLORIDE 2 MEQ/ML IV SOLN
INTRAVENOUS | Status: DC
  Filled 2022-08-22 (×4): qty 1000

## 2022-08-22 NOTE — Progress Notes (Addendum)
PROGRESS NOTE    Elaine Young  [id number] DOB: 03-Mar-1961 DOA: 08/21/2022 PCP: Pleas Koch, NP   Brief Narrative: 33 for hypertension, anxiety, hyperlipidemia, nephrolithiasis presented with flank pain.  She was last seen at [facility] 2/13 with hematuria from probable UTI, negative culture, treated with Macrobid.  She reported that she started last night with nausea vomiting diarrhea.  Still complaining of back pain.   Assessment & Plan:   Principal Problem:   Gastroenteritis Active Problems:   Essential hypertension   Hyperlipidemia   Anxiety and depression   Acne vulgaris   Recent urinary tract infection   Class 1 obesity due to excess calories with body mass index (BMI) of 30.0 to 30.9 in adult   1-Gastroenteritis:  -Patient presented with nausea vomiting diarrhea and abdominal pain. -CT finding consistent with gastroenteritis. -Unable to tolerate diet still, continues to have diarrhea and nausea. -Increase IV fluids, IV bolus. -As needed Zofran. -Will check for C diff and -GI pathogen  2-Recent UTI: He reported resolution of bilateral flank pain. She was treated with Macrobid. Culture no growth today.  CT without evidence of bladder or kidney infection. No need for further  antibiotic.  3-Hypertension: Continue with amlodipine.  4-Hyperlipidemia: Continue with atorvastatin  5-anxiety: Continue with Effexor and Neurontin.  Hypokalemia: replete orally and IV fluids.   Acne -Hold Accutane while inpatient   Obesity: BMI 30: Will benefit of weight loss  Estimated body mass index is 30.41 kg/m as calculated from the following:   Height as of this encounter: 5' 8"$  (1.727 m).   Weight as of this encounter: 90.7 kg.   DVT prophylaxis: Lovenox Code Status: Full code Family Communication: Care discussed with patient Disposition Plan:  Status is: Observation The patient will require care spanning > 2 midnights and should be moved to inpatient because:  Gastroenteritis, still vomiting and having diarrhea requiring hydration    Consultants:  None  Procedures:  none  Antimicrobials:    Subjective: She is still feeling nauseous, she has not been able to drink anything.  Continued to have multiple episodes of watery diarrhea.  Reports mild abdominal discomfort.  Reports resolution of flank pain only has a chronic back pain  Objective: Vitals:   08/21/22 1304 08/21/22 1446 08/21/22 2302 08/22/22 0754  BP:  (!) 156/61 (!) 140/56 134/66  Pulse:  (!) 109 (!) 107 96  Resp:  19 18 16  $ Temp:  99.6 F (37.6 C) 98.2 F (36.8 C) 98.6 F (37 C)  TempSrc:  Oral    SpO2: 94% 96% 92% 96%  Weight:      Height:        Intake/Output Summary (Last 24 hours) at 08/22/2022 0811 Last data filed at 08/22/2022 0502 Gross per 24 hour  Intake 1321.58 ml  Output --  Net 1321.58 ml   Filed Weights   08/21/22 0454 08/21/22 0508  Weight: 90.7 kg 90.7 kg    Examination:  General exam: Appears calm and comfortable  Respiratory system: Clear to auscultation. Respiratory effort normal. Cardiovascular system: S1 & S2 heard, RRR.  Gastrointestinal system: Abdomen is nondistended, soft and mild tender. No organomegaly or masses felt. Normal bowel sounds heard. Central nervous system: Alert and oriented.  Extremities: Symmetric 5 x 5 power.    Data Reviewed: I have personally reviewed following labs and imaging studies  CBC: Recent Labs  Lab 08/21/22 0521 08/22/22 0627  WBC 12.6* 5.7  NEUTROABS 11.3*  --   HGB 14.3 12.0  HCT  41.8 35.6*  MCV 87.6 87.7  PLT 326 [id number]   Basic Metabolic Panel: Recent Labs  Lab 08/21/22 0648 08/22/22 0627  NA 136 132*  K 3.7 2.8*  CL 106 102  CO2 22 22  GLUCOSE 181* 124*  BUN 23 10  CREATININE 0.65 0.55  CALCIUM 8.7* 7.9*   GFR: Estimated Creatinine Clearance: 85.9 mL/min (by C-G formula based on SCr of 0.55 mg/dL). Liver Function Tests: Recent Labs  Lab 08/21/22 0648 08/22/22 0627  AST 27 28   ALT 31 31  ALKPHOS 87 71  BILITOT 1.0 0.7  PROT 7.2 6.2*  ALBUMIN 4.1 3.3*   Recent Labs  Lab 08/21/22 0648  LIPASE 25   No results for input(s): "AMMONIA" in the last 168 hours. Coagulation Profile: No results for input(s): "INR", "PROTIME" in the last 168 hours. Cardiac Enzymes: No results for input(s): "CKTOTAL", "CKMB", "CKMBINDEX", "TROPONINI" in the last 168 hours. BNP (last 3 results) No results for input(s): "PROBNP" in the last 8760 hours. HbA1C: No results for input(s): "HGBA1C" in the last 72 hours. CBG: No results for input(s): "GLUCAP" in the last 168 hours. Lipid Profile: No results for input(s): "CHOL", "HDL", "LDLCALC", "TRIG", "CHOLHDL", "LDLDIRECT" in the last 72 hours. Thyroid Function Tests: No results for input(s): "TSH", "T4TOTAL", "FREET4", "T3FREE", "THYROIDAB" in the last 72 hours. Anemia Panel: No results for input(s): "VITAMINB12", "FOLATE", "FERRITIN", "TIBC", "IRON", "RETICCTPCT" in the last 72 hours. Sepsis Labs: No results for input(s): "PROCALCITON", "LATICACIDVEN" in the last 168 hours.  Recent Results (from the past 240 hour(s))  Urine Culture     Status: Abnormal   Collection Time: 08/14/22  7:16 PM   Specimen: Urine, Clean Catch  Result Value Ref Range Status   Specimen Description URINE, CLEAN CATCH  Final   Special Requests NONE  Final   Culture (A)  Final    <10,000 COLONIES/mL INSIGNIFICANT GROWTH Performed at [facility], [address] [place], [place] [address]    Report Status 08/15/2022 FINAL  Final         Radiology Studies: CT ABDOMEN PELVIS W CONTRAST  Result Date: 08/21/2022 CLINICAL DATA:  62 year old female with abdomen and flank pain, vomiting. History of kidney stone. EXAM: CT ABDOMEN AND PELVIS WITH CONTRAST TECHNIQUE: Multidetector CT imaging of the abdomen and pelvis was performed using the standard protocol following bolus administration of intravenous contrast. RADIATION DOSE REDUCTION: This exam  was performed according to the departmental dose-optimization program which includes automated exposure control, adjustment of the mA and/or kV according to patient size and/or use of iterative reconstruction technique. CONTRAST:  173m OMNIPAQUE IOHEXOL 300 MG/ML  SOLN COMPARISON:  CT Abdomen and Pelvis 10/18/2020. FINDINGS: Lower chest: Lower lung volumes compared to 2022 with chronic lower lobe scarring or atelectasis in both costophrenic angles. No pericardial or pleural effusion. Hepatobiliary: Chronically absent gallbladder. Evidence of hepatic steatosis but otherwise negative liver. Pancreas: Negative. Spleen: Negative. Adrenals/Urinary Tract: Normal adrenal glands. Nonobstructed kidneys with symmetric renal enhancement and contrast excretion. Small chronic right renal midpole cyst is simple and stable (no follow-up imaging recommended). No nephrolithiasis. Decompressed ureters and urinary bladder. Stomach/Bowel: Negative rectum. Chronic diverticulosis of the distal descending colon and throughout the sigmoid colon in the pelvis. But no convincing active inflammation. Superimposed fluid in the large bowel to the level of the descending colon. Occasional diverticula in the transverse colon and right colon with no active inflammation. No large bowel wall thickening identified. Cecum is in the midline. Suspected midline gas containing  appendix on coronal image 42 without inflammation. Decompressed but fluid-filled terminal ileum and distal small bowel. And fluid-filled nondilated small bowel throughout the abdomen and pelvis. Similar fluid in the stomach and duodenum. Small 2nd portion duodenal diverticulum series 2, image 35 with no active inflammation. No free air, free fluid, or mesenteric stranding identified. Vascular/Lymphatic: Aortoiliac calcified atherosclerosis. Normal caliber abdominal aorta and the major arterial structures remain patent. Portal venous system is patent. No lymphadenopathy identified.  Reproductive: Chronically absent uterus. Ovaries are stable and within normal limits. Other: No pelvis free fluid. Musculoskeletal: Chronic L5-S1 anterior interbody fusion. No acute osseous abnormality identified. IMPRESSION: 1. Fluid-filled but nondilated small and large bowel throughout the abdomen compatible with enteritis/diarrhea. But no discrete bowel inflammation. Diverticulosis of the large bowel with no active inflammation. 2. No other acute or inflammatory process identified in the abdomen or pelvis. No nephrolithiasis. 3. Mild Aortic Atherosclerosis (ICD10-I70.0). Electronically Signed   By: Genevie Ann M.D.   On: 08/21/2022 07:51        Scheduled Meds:  amLODipine  5 mg Oral Daily   enoxaparin (LOVENOX) injection  45 mg Subcutaneous Q24H   gabapentin  300 mg Oral BID   potassium chloride  40 mEq Oral Q4H   rosuvastatin  10 mg Oral Daily   venlafaxine XR  150 mg Oral Q breakfast   venlafaxine XR  37.5 mg Oral Q breakfast   Continuous Infusions:  lactated ringers 75 mL/hr at 08/22/22 0502   potassium chloride       LOS: 0 days    Time spent: 35 minutes    Lemont Sitzmann A Morgyn Marut, MD [facility]   If 7PM-7AM, please contact night-coverage [email or web address]  08/22/2022, 8:11 AM

## 2022-08-23 ENCOUNTER — Other Ambulatory Visit: Payer: Self-pay | Admitting: Primary Care

## 2022-08-23 DIAGNOSIS — M545 Low back pain, unspecified: Secondary | ICD-10-CM

## 2022-08-23 DIAGNOSIS — K529 Noninfective gastroenteritis and colitis, unspecified: Secondary | ICD-10-CM | POA: Diagnosis not present

## 2022-08-23 LAB — BASIC METABOLIC PANEL
Anion gap: 5 (ref 5–15)
BUN: 6 mg/dL — ABNORMAL LOW (ref 8–23)
CO2: 24 mmol/L (ref 22–32)
Calcium: 8.8 mg/dL — ABNORMAL LOW (ref 8.9–10.3)
Chloride: 108 mmol/L (ref 98–111)
Creatinine, Ser: 0.55 mg/dL (ref 0.44–1.00)
GFR, Estimated: >60 mL/min (ref >60)
Glucose, Bld: 87 mg/dL (ref 70–99)
Potassium: 3.7 mmol/L (ref 3.5–5.1)
Sodium: 137 mmol/L (ref 135–145)

## 2022-08-23 LAB — MAGNESIUM: Magnesium: 1.9 mg/dL (ref 1.7–2.4)

## 2022-08-23 MED ORDER — ONDANSETRON HCL 4 MG PO TABS: 4.0000 mg | ORAL_TABLET | Freq: Four times a day (QID) | ORAL | 0 refills | Status: DC | PRN

## 2022-08-23 NOTE — Discharge Summary (Signed)
Physician Discharge Summary   Patient: Elaine Young MRN: [id number] DOB: [birth year]  Admit date:     08/21/2022  Discharge date: 08/23/22  Discharge Physician: Elmarie Shiley   PCP: Pleas Koch, NP   Recommendations at discharge:   Encourage weight loss.  FU resolution gastroenteritis.   Discharge Diagnoses: Principal Problem:   Gastroenteritis Active Problems:   Essential hypertension   Hyperlipidemia   Anxiety and depression   Acne vulgaris   Recent urinary tract infection   Class 1 obesity due to excess calories with body mass index (BMI) of 30.0 to 30.9 in adult   Intractable vomiting with nausea  Resolved Problems:   * No resolved hospital problems. [facility] Course: 83 for hypertension, anxiety, hyperlipidemia, nephrolithiasis presented with flank pain.  She was last seen at [facility] 2/13 with hematuria from probable UTI, negative culture, treated with Macrobid.  She reported that she started last night with nausea vomiting diarrhea.  Still complaining of back pain.    Assessment and Plan: 1-Gastroenteritis:  -Patient presented with nausea vomiting diarrhea and abdominal pain. -CT finding consistent with gastroenteritis. -Treated with IV fluids, IV bolus. -As needed Zofran. -C diff negative, GI pathogen positive for Norovirus.   -Diarrhea improved, after few doses of Imodium. She has been tolerating clear liquid diet. If she tolerates soft diet plan to discharge home today.   2-Recent UTI: He reported resolution of bilateral flank pain. She was treated with Macrobid. Culture no growth today.  CT without evidence of bladder or kidney infection. No need for further  antibiotic.   3-Hypertension: Continue with amlodipine.   4-Hyperlipidemia: Continue with atorvastatin   5-anxiety: Continue with Effexor and Neurontin.   Hypokalemia: Replaced.    Acne -Hold Accutane while inpatient   Obesity: BMI 30: Will benefit of weight loss   Estimated  body mass index is 30.41 kg/m as calculated from the following:   Height as of this encounter: 5' 8"$  (1.727 m).   Weight as of this encounter: 90.7 kg.          Consultants: None Procedures performed: None Disposition: Home Diet recommendation:  Discharge Diet Orders (From admission, onward)     Start     Ordered   08/23/22 0000  Diet - low sodium heart healthy        08/23/22 1114           Cardiac diet DISCHARGE MEDICATION: Allergies as of 08/23/2022       Reactions   Codeine Nausea And Vomiting   Latex Rash   Lipitor [atorvastatin] Rash   Shellfish Allergy Swelling, Rash   Sulfa Antibiotics Rash        Medication List     TAKE these medications    acetaminophen 500 MG tablet Commonly known as: TYLENOL Take 500 mg by mouth every 6 (six) hours as needed (for pain.).   amLODipine 5 MG tablet Commonly known as: NORVASC Take 1 tablet (5 mg total) by mouth daily. for blood pressure   gabapentin 300 MG capsule Commonly known as: NEURONTIN Take 1 capsule (300 mg total) by mouth in the morning and at bedtime. For pain.   ibuprofen 200 MG tablet Commonly known as: ADVIL Take 800 mg by mouth every 6 (six) hours as needed for headache or moderate pain.   ISOtretinoin 30 MG capsule Commonly known as: ACCUTANE Take 1 capsule (30 mg total) by mouth 2 (two) times daily.   ondansetron 4 MG tablet Commonly known as: ZOFRAN  Take 1 tablet (4 mg total) by mouth every 6 (six) hours as needed for nausea.   rosuvastatin 10 MG tablet Commonly known as: CRESTOR Take 1 tablet (10 mg total) by mouth daily. for cholesterol.   venlafaxine XR 150 MG 24 hr capsule Commonly known as: EFFEXOR-XR TAKE 1 CAPSULE BY MOUTH DAILY  WITH BREAKFAST FOR ANXIETY AND  DEPRESSION TAKE WITH 37.5 MG   venlafaxine XR 37.5 MG 24 hr capsule Commonly known as: EFFEXOR-XR TAKE 1 CAPSULE BY MOUTH DAILY  WITH BREAKFAST FOR DEPRESSION  AND ANXIETY TAKE WITH 150 MG        Discharge  Exam: Filed Weights   08/21/22 0454 08/21/22 0508  Weight: 90.7 kg 90.7 kg   General; NAD  Condition at discharge: stable  The results of significant diagnostics from this hospitalization (including imaging, microbiology, ancillary and laboratory) are listed below for reference.   Imaging Studies: CT ABDOMEN PELVIS W CONTRAST  Result Date: 08/21/2022 CLINICAL DATA:  62 year old female with abdomen and flank pain, vomiting. History of kidney stone. EXAM: CT ABDOMEN AND PELVIS WITH CONTRAST TECHNIQUE: Multidetector CT imaging of the abdomen and pelvis was performed using the standard protocol following bolus administration of intravenous contrast. RADIATION DOSE REDUCTION: This exam was performed according to the departmental dose-optimization program which includes automated exposure control, adjustment of the mA and/or kV according to patient size and/or use of iterative reconstruction technique. CONTRAST:  130m OMNIPAQUE IOHEXOL 300 MG/ML  SOLN COMPARISON:  CT Abdomen and Pelvis 10/18/2020. FINDINGS: Lower chest: Lower lung volumes compared to 2022 with chronic lower lobe scarring or atelectasis in both costophrenic angles. No pericardial or pleural effusion. Hepatobiliary: Chronically absent gallbladder. Evidence of hepatic steatosis but otherwise negative liver. Pancreas: Negative. Spleen: Negative. Adrenals/Urinary Tract: Normal adrenal glands. Nonobstructed kidneys with symmetric renal enhancement and contrast excretion. Small chronic right renal midpole cyst is simple and stable (no follow-up imaging recommended). No nephrolithiasis. Decompressed ureters and urinary bladder. Stomach/Bowel: Negative rectum. Chronic diverticulosis of the distal descending colon and throughout the sigmoid colon in the pelvis. But no convincing active inflammation. Superimposed fluid in the large bowel to the level of the descending colon. Occasional diverticula in the transverse colon and right colon with no active  inflammation. No large bowel wall thickening identified. Cecum is in the midline. Suspected midline gas containing appendix on coronal image 42 without inflammation. Decompressed but fluid-filled terminal ileum and distal small bowel. And fluid-filled nondilated small bowel throughout the abdomen and pelvis. Similar fluid in the stomach and duodenum. Small 2nd portion duodenal diverticulum series 2, image 35 with no active inflammation. No free air, free fluid, or mesenteric stranding identified. Vascular/Lymphatic: Aortoiliac calcified atherosclerosis. Normal caliber abdominal aorta and the major arterial structures remain patent. Portal venous system is patent. No lymphadenopathy identified. Reproductive: Chronically absent uterus. Ovaries are stable and within normal limits. Other: No pelvis free fluid. Musculoskeletal: Chronic L5-S1 anterior interbody fusion. No acute osseous abnormality identified. IMPRESSION: 1. Fluid-filled but nondilated small and large bowel throughout the abdomen compatible with enteritis/diarrhea. But no discrete bowel inflammation. Diverticulosis of the large bowel with no active inflammation. 2. No other acute or inflammatory process identified in the abdomen or pelvis. No nephrolithiasis. 3. Mild Aortic Atherosclerosis (ICD10-I70.0). Electronically Signed   By: HGenevie AnnM.D.   On: 08/21/2022 07:51    Microbiology: Results for orders placed or performed during the hospital encounter of 08/21/22  Urine Culture     Status: None   Collection Time: 08/21/22  7:15 AM  Specimen: Urine, Random  Result Value Ref Range Status   Specimen Description   Final    URINE, RANDOM Performed at [facility], [address] [place], [place] [address]    Special Requests   Final    NONE Performed at [facility], [address] [place], [place] [address]    Culture   Final    NO GROWTH Performed at [facility], [place] [address] [place], [place] [address]     Report Status 08/22/2022 FINAL  Final  C Difficile Quick Screen w PCR reflex     Status: None   Collection Time: 08/22/22 12:25 PM   Specimen: Rectum; Stool  Result Value Ref Range Status   C Diff antigen NEGATIVE NEGATIVE Final   C Diff toxin NEGATIVE NEGATIVE Final   C Diff interpretation No C. difficile detected.  Final    Comment: Performed at [facility], [address] [place], [place] [address]  Gastrointestinal Panel by PCR , Stool     Status: Abnormal   Collection Time: 08/22/22 12:25 PM   Specimen: Rectum; Stool  Result Value Ref Range Status   Campylobacter species NOT DETECTED NOT DETECTED Final   Plesimonas shigelloides NOT DETECTED NOT DETECTED Final   Salmonella species NOT DETECTED NOT DETECTED Final   Yersinia enterocolitica NOT DETECTED NOT DETECTED Final   Vibrio species NOT DETECTED NOT DETECTED Final   Vibrio cholerae NOT DETECTED NOT DETECTED Final   Enteroaggregative E coli (EAEC) NOT DETECTED NOT DETECTED Final   Enteropathogenic E coli (EPEC) NOT DETECTED NOT DETECTED Final   Enterotoxigenic E coli (ETEC) NOT DETECTED NOT DETECTED Final   Shiga like toxin producing E coli (STEC) NOT DETECTED NOT DETECTED Final   Shigella/Enteroinvasive E coli (EIEC) NOT DETECTED NOT DETECTED Final   Cryptosporidium NOT DETECTED NOT DETECTED Final   Cyclospora cayetanensis NOT DETECTED NOT DETECTED Final   Entamoeba histolytica NOT DETECTED NOT DETECTED Final   Giardia lamblia NOT DETECTED NOT DETECTED Final   Adenovirus F40/41 NOT DETECTED NOT DETECTED Final   Astrovirus NOT DETECTED NOT DETECTED Final   Norovirus GI/GII DETECTED (A) NOT DETECTED Final    Comment: RESULT CALLED TO, READ BACK BY AND VERIFIED WITH: BERNA MIRAFUENT AT 1525 ON 08/22/22 BY SS    Rotavirus A NOT DETECTED NOT DETECTED Final   Sapovirus (I, II, IV, and V) NOT DETECTED NOT DETECTED Final    Comment: Performed at [facility], [address] [place], [place] [address]     Labs: CBC: Recent Labs  Lab 08/21/22 0521 08/22/22 0627  WBC 12.6* 5.7  NEUTROABS 11.3*  --   HGB 14.3 12.0  HCT 41.8 35.6*  MCV 87.6 87.7  PLT 326 [id number]   Basic Metabolic Panel: Recent Labs  Lab 08/21/22 0648 08/22/22 0622 08/22/22 0627 08/22/22 1551 08/23/22 0258  NA 136  --  132* 135 137  K 3.7  --  2.8* 3.8 3.7  CL 106  --  102 104 108  CO2 22  --  22 25 24  $ GLUCOSE 181*  --  124* 100* 87  BUN 23  --  10 7* 6*  CREATININE 0.65  --  0.55 0.57 0.55  CALCIUM 8.7*  --  7.9* 8.5* 8.8*  MG  --  1.7  --   --  1.9   Liver Function Tests: Recent Labs  Lab 08/21/22 0648 08/22/22 0627  AST 27 28  ALT 31 31  ALKPHOS 87 71  BILITOT 1.0 0.7  PROT 7.2 6.2*  ALBUMIN 4.1 3.3*   CBG: No results for input(s): "GLUCAP" in the last 168 hours.  Discharge time spent: greater than 30 minutes.  Signed: Elmarie Shiley, MD [facility] 08/23/2022

## 2022-08-23 NOTE — Plan of Care (Signed)
  Problem: Health Behavior/Discharge Planning: Goal: Ability to manage health-related needs will improve Outcome: Progressing   

## 2022-08-24 ENCOUNTER — Telehealth: Payer: Self-pay | Admitting: *Deleted

## 2022-08-24 NOTE — Telephone Encounter (Signed)
Left a message on voicemail for patient for patient to call the office back.

## 2022-08-24 NOTE — Telephone Encounter (Signed)
Please call patient:  Received refill request for gabapentin from pharmacy. Does she have enough on hand to make it until next week for her visit?

## 2022-08-24 NOTE — Transitions of Care (Post Inpatient/ED Visit) (Signed)
   08/24/2022  Name: Elaine Young MRN: [id number] DOB: July 01, 1961  Today's TOC FU Call Status: Today's TOC FU Call Status:: Unsuccessul Call (1st Attempt) Unsuccessful Call (1st Attempt) Date: 08/24/22  Attempted to reach the patient regarding the most recent Inpatient/ED visit.  Follow Up Plan: Additional outreach attempts will be made to reach the patient to complete the Transitions of Care (Post Inpatient/ED visit) call.   [facility] [phone]

## 2022-08-27 ENCOUNTER — Telehealth: Payer: Self-pay | Admitting: *Deleted

## 2022-08-27 ENCOUNTER — Telehealth: Payer: Self-pay | Admitting: Primary Care

## 2022-08-27 NOTE — Transitions of Care (Post Inpatient/ED Visit) (Signed)
   08/27/2022  Name: Elaine Young MRN: [id number] DOB: 05/20/1961  Today's TOC FU Call Status: Today's TOC FU Call Status:: Unsuccessful Call (2nd Attempt) Unsuccessful Call (2nd Attempt) Date: 08/27/22  Attempted to reach the patient regarding the most recent Inpatient/ED visit.  Follow Up Plan: Additional outreach attempts will be made to reach the patient to complete the Transitions of Care (Post Inpatient/ED visit) call.   [facility] [phone]

## 2022-08-27 NOTE — Telephone Encounter (Signed)
Pt called stating she has a hosp f/u on 08/28/22 with [facility]. Pt states she has a conflict & needs to reschedule. Told pt that [facility] next available slot for hosp f/u isn't until 3/27. Pt states she already has a cpe scheduled for 3/27 & asked could she do both visits within one or can she be squeezed in sooner? Call back # [phone]

## 2022-08-28 ENCOUNTER — Inpatient Hospital Stay: Payer: BLUE CROSS/BLUE SHIELD | Admitting: Primary Care

## 2022-08-28 ENCOUNTER — Telehealth: Payer: Self-pay | Admitting: *Deleted

## 2022-08-28 NOTE — Telephone Encounter (Signed)
Called and rescheduled patient for 08/29/22

## 2022-08-28 NOTE — Transitions of Care (Post Inpatient/ED Visit) (Signed)
   08/28/2022  Name: Elaine Young MRN: [id number] DOB: 05-Sep-1960  Today's TOC FU Call Status: Today's TOC FU Call Status:: Unsuccessful Call (3rd Attempt) Unsuccessful Call (3rd Attempt) Date: 08/28/22  Attempted to reach the patient regarding the most recent Inpatient/ED visit.  Follow Up Plan: No further outreach attempts will be made at this time. We have been unable to contact the patient.  [facility] [phone]

## 2022-08-28 NOTE — Telephone Encounter (Signed)
Unable to reach patient. Left voicemail to return call to our office.   Patient has appt 08/29/22

## 2022-08-29 ENCOUNTER — Encounter: Payer: Self-pay | Admitting: Primary Care

## 2022-08-29 ENCOUNTER — Ambulatory Visit: Payer: BLUE CROSS/BLUE SHIELD | Admitting: Primary Care

## 2022-08-29 VITALS — BP 122/76 | HR 79 | Temp 97.2°F | Ht 68.0 in | Wt 201.0 lb

## 2022-08-29 DIAGNOSIS — Z1231 Encounter for screening mammogram for malignant neoplasm of breast: Secondary | ICD-10-CM | POA: Diagnosis not present

## 2022-08-29 DIAGNOSIS — M545 Low back pain, unspecified: Secondary | ICD-10-CM

## 2022-08-29 DIAGNOSIS — G8929 Other chronic pain: Secondary | ICD-10-CM

## 2022-08-29 DIAGNOSIS — A0811 Acute gastroenteropathy due to Norwalk agent: Secondary | ICD-10-CM | POA: Diagnosis not present

## 2022-08-29 LAB — CBC
HCT: 39.7 % (ref 36.0–46.0)
Hemoglobin: 13.4 g/dL (ref 12.0–15.0)
MCHC: 33.8 g/dL (ref 30.0–36.0)
MCV: 89.7 fl (ref 78.0–100.0)
Platelets: 345 10*3/uL (ref 150.0–400.0)
RBC: 4.43 Mil/uL (ref 3.87–5.11)
RDW: 13.9 % (ref 11.5–15.5)
WBC: 6.2 10*3/uL (ref 4.0–10.5)

## 2022-08-29 LAB — BASIC METABOLIC PANEL
BUN: 11 mg/dL (ref 6–23)
CO2: 24 mEq/L (ref 19–32)
Calcium: 9.7 mg/dL (ref 8.4–10.5)
Chloride: 107 mEq/L (ref 96–112)
Creatinine, Ser: 0.69 mg/dL (ref 0.40–1.20)
GFR: 92.9 mL/min (ref >60.00)
Glucose, Bld: 102 mg/dL — ABNORMAL HIGH (ref 70–99)
Potassium: 4.4 mEq/L (ref 3.5–5.1)
Sodium: 139 mEq/L (ref 135–145)

## 2022-08-29 MED ORDER — GABAPENTIN 300 MG PO CAPS: 300.0000 mg | ORAL_CAPSULE | Freq: Two times a day (BID) | ORAL | 0 refills | Status: DC

## 2022-08-29 NOTE — Assessment & Plan Note (Signed)
Recent hospitalization. Reviewed hospital notes, labs, imaging.  Exam today reassuring. Will recheck BMP and CBC today.  Advance diet as tolerated. Continue to maintain hydration.

## 2022-08-29 NOTE — Telephone Encounter (Signed)
Refilled for patient while she was at her appt on 08/29/22

## 2022-08-29 NOTE — Progress Notes (Signed)
Subjective:    Patient ID: Elaine Young, female    DOB: May 11, 1961, 62 y.o.   MRN: [id number]  HPI  Elaine Young is a very pleasant 62 y.o. female with a history of hypertension, kidney stone, hyperlipidemia, prediabetes, chronic back pain, cholecystotomy, recurrent UTI who presents today for hospital follow up.  She is needing a refill of her gabapentin. She is also needing another order for mammogram.   She presented to [facility] ED on 08/21/22 with bilateral lower back pain with radiation around to her abdomen with nausea and vomiting, diarrhea. During her stay in the ED she was noted to be hypertensive and tachycardic. She was treated with IV fluids, pain medication, and anti-nausea medication. She underwent CT abdomen/pelvis which revealed fluid filled nondilated small and large bowel, no other acute process. Symptoms, UA, and exam concerning for pyelonephritis. She was treated with IV rocephin and admitted.   During her hospital stay she was diagnosed with gastritis. Her UTI was also treated with Macrobid course. Stool studies were collected which were negative for c-diff. GI pathogen was positive for Norovirus. She began to feel better so she was discharged home on 08/23/22 with recommendation for PCP follow up.   Since her hospital stay she's feeling tired, but overall better. She denies nausea, vomiting, and diarrhea. She's not had to take Zofran at home. She is monitoring her HR at home which is running in the 70's. She's eating small portion as tolerated. She is drinking plenty of water. Her last bowel movement was last night. She has noticed lower extremity cramping so she's eating bananas and sweet potatoes.     Review of Systems  Constitutional:  Positive for fatigue.  Cardiovascular:  Negative for palpitations.  Neurological:  Negative for dizziness.         Past Medical History:  Diagnosis Date   Carpal tunnel syndrome    Cervical disc disorder     Complication of anesthesia    Essential hypertension    Functional incontinence    Generalized anxiety disorder    Hyperlipidemia    Lower extremity numbness    Lumbar disc disease    Migraines    PONV (postoperative nausea and vomiting)    per pt, she usually gets zofran and scopalamine patch    Renal disorder    kidney stone   Seasonal allergies    Vitamin D deficiency     Social History   Socioeconomic History   Marital status: Married    Spouse name: Not on file   Number of children: Not on file   Years of education: Not on file   Highest education level: Not on file  Occupational History   Not on file  Tobacco Use   Smoking status: Never   Smokeless tobacco: Never  Vaping Use   Vaping Use: Never used  Substance and Sexual Activity   Alcohol use: Yes    Alcohol/week: 0.0 standard drinks of alcohol   Drug use: No   Sexual activity: Not on file  Other Topics Concern   Not on file  Social History Narrative   Married.   1 child. 1 grandchild.   Retired.    Enjoys reading.    Social Determinants of Health   Financial Resource Strain: Not on file  Food Insecurity: No Food Insecurity (08/21/2022)   Hunger Vital Sign    Worried About Running Out of Food in the Last Year: Never true    Ran Out of Food in  the Last Year: Never true  Transportation Needs: No Transportation Needs (08/21/2022)   PRAPARE - Hydrologist (Medical): No    Lack of Transportation (Non-Medical): No  Physical Activity: Not on file  Stress: Not on file  Social Connections: Not on file  Intimate Partner Violence: Not At Risk (08/21/2022)   Humiliation, Afraid, Rape, and Kick questionnaire    Fear of Current or Ex-Partner: No    Emotionally Abused: No    Physically Abused: No    Sexually Abused: No    Past Surgical History:  Procedure Laterality Date   ABDOMINAL EXPOSURE N/A 11/16/2019   Procedure: ABDOMINAL EXPOSURE;  Surgeon: Marty Heck, MD;   Location: [facility];  Service: Vascular;  Laterality: N/A;   ABDOMINAL HYSTERECTOMY     ANTERIOR LUMBAR FUSION N/A 11/16/2019   Procedure: LUMBAR FIVE-SACRAL ONE ANTERIOR LUMBAR INTERBODY FUSION;  Surgeon: Eustace Moore, MD;  Location: [facility];  Service: Neurosurgery;  Laterality: N/A;  anterior approach   BLADDER SUSPENSION  2005   BREAST BIOPSY Bilateral yrs ago in [place]   high risk lesions, not cancer per pt   BREAST CYST EXCISION Bilateral yrs ago in [place]   hard to see some excision areas   CHOLECYSTECTOMY     TONSILLECTOMY      Family History  Problem Relation Age of Onset   Hyperlipidemia Mother    Breast cancer Mother 54   Ovarian cancer Maternal Aunt    Lung cancer Maternal Uncle    Arthritis Maternal Grandmother    Arthritis Maternal Grandfather    Colon cancer Maternal Grandfather    Arthritis Paternal Grandmother    Hypertension Paternal Grandmother    Diabetes Paternal Grandmother    Arthritis Paternal Grandfather    Diabetes Paternal Grandfather    Arthritis Cousin    Breast cancer Cousin        maternal side x2    Allergies  Allergen Reactions   Codeine Nausea And Vomiting   Latex Rash   Lipitor [Atorvastatin] Rash   Shellfish Allergy Swelling and Rash   Sulfa Antibiotics Rash    Current Outpatient Medications on File Prior to Visit  Medication Sig Dispense Refill   acetaminophen (TYLENOL) 500 MG tablet Take 500 mg by mouth every 6 (six) hours as needed (for pain.).     amLODipine (NORVASC) 5 MG tablet Take 1 tablet (5 mg total) by mouth daily. for blood pressure 90 tablet 0   ibuprofen (ADVIL) 200 MG tablet Take 800 mg by mouth every 6 (six) hours as needed for headache or moderate pain.     ISOtretinoin (ACCUTANE) 30 MG capsule Take 1 capsule (30 mg total) by mouth 2 (two) times daily. 60 capsule 0   rosuvastatin (CRESTOR) 10 MG tablet Take 1 tablet (10 mg total) by mouth daily. for cholesterol. 90 tablet 0   venlafaxine XR (EFFEXOR-XR) 150 MG 24 hr capsule TAKE 1  CAPSULE BY MOUTH DAILY  WITH BREAKFAST FOR ANXIETY AND  DEPRESSION TAKE WITH 37.5 MG 90 capsule 0   venlafaxine XR (EFFEXOR-XR) 37.5 MG 24 hr capsule TAKE 1 CAPSULE BY MOUTH DAILY  WITH BREAKFAST FOR DEPRESSION  AND ANXIETY TAKE WITH 150 MG 90 capsule 0   ondansetron (ZOFRAN) 4 MG tablet Take 1 tablet (4 mg total) by mouth every 6 (six) hours as needed for nausea. (Patient not taking: Reported on 08/29/2022) 20 tablet 0   No current facility-administered medications on file prior to visit.  BP 122/76   Pulse 79   Temp (!) 97.2 F (36.2 C) (Temporal)   Ht '5\' 8"'$  (1.727 m)   Wt 201 lb (91.2 kg)   SpO2 98%   BMI 30.56 kg/m  Objective:   Physical Exam Cardiovascular:     Rate and Rhythm: Normal rate and regular rhythm.  Pulmonary:     Effort: Pulmonary effort is normal.     Breath sounds: Normal breath sounds.  Abdominal:     General: Bowel sounds are normal.     Palpations: Abdomen is soft.     Tenderness: There is no abdominal tenderness.  Musculoskeletal:     Cervical back: Neck supple.  Skin:    General: Skin is warm and dry.           Assessment & Plan:  Norovirus Assessment & Plan: Recent hospitalization. Reviewed hospital notes, labs, imaging.  Exam today reassuring. Will recheck BMP and CBC today.  Advance diet as tolerated. Continue to maintain hydration.     Orders: -     CBC -     Basic metabolic panel  Chronic bilateral low back pain, unspecified whether sciatica present -     Gabapentin; Take 1 capsule (300 mg total) by mouth in the morning and at bedtime. For pain.  Dispense: 180 capsule; Refill: 0  Screening mammogram for breast cancer -     3D Screening Mammogram, Left and Right; Future        Pleas Koch, NP

## 2022-08-29 NOTE — Patient Instructions (Signed)
Stop by the lab prior to leaving today. I will notify you of your results once received.   Advance your diet as tolerated.  [facility].   It was a pleasure to see you today!

## 2022-09-04 ENCOUNTER — Other Ambulatory Visit: Payer: Self-pay | Admitting: Primary Care

## 2022-09-04 DIAGNOSIS — E785 Hyperlipidemia, unspecified: Secondary | ICD-10-CM

## 2022-09-04 DIAGNOSIS — I10 Essential (primary) hypertension: Secondary | ICD-10-CM

## 2022-09-12 DIAGNOSIS — R200 Anesthesia of skin: Secondary | ICD-10-CM | POA: Diagnosis not present

## 2022-09-12 DIAGNOSIS — M5412 Radiculopathy, cervical region: Secondary | ICD-10-CM | POA: Diagnosis not present

## 2022-09-12 DIAGNOSIS — R202 Paresthesia of skin: Secondary | ICD-10-CM | POA: Diagnosis not present

## 2022-09-25 DIAGNOSIS — M5412 Radiculopathy, cervical region: Secondary | ICD-10-CM | POA: Diagnosis not present

## 2022-09-26 ENCOUNTER — Encounter: Payer: BLUE CROSS/BLUE SHIELD | Admitting: Primary Care

## 2022-10-02 DIAGNOSIS — M5412 Radiculopathy, cervical region: Secondary | ICD-10-CM | POA: Diagnosis not present

## 2022-10-04 ENCOUNTER — Other Ambulatory Visit: Payer: Self-pay | Admitting: Primary Care

## 2022-10-04 DIAGNOSIS — F32A Depression, unspecified: Secondary | ICD-10-CM

## 2022-10-08 DIAGNOSIS — M5412 Radiculopathy, cervical region: Secondary | ICD-10-CM | POA: Diagnosis not present

## 2022-10-09 DIAGNOSIS — M5412 Radiculopathy, cervical region: Secondary | ICD-10-CM | POA: Diagnosis not present

## 2022-10-11 DIAGNOSIS — M5412 Radiculopathy, cervical region: Secondary | ICD-10-CM | POA: Diagnosis not present

## 2022-10-17 ENCOUNTER — Encounter: Payer: Self-pay | Admitting: *Deleted

## 2022-10-17 ENCOUNTER — Ambulatory Visit (INDEPENDENT_AMBULATORY_CARE_PROVIDER_SITE_OTHER): Payer: BLUE CROSS/BLUE SHIELD | Admitting: Primary Care

## 2022-10-17 ENCOUNTER — Encounter: Payer: Self-pay | Admitting: Primary Care

## 2022-10-17 VITALS — BP 132/78 | HR 88 | Temp 97.5°F | Ht 68.0 in | Wt 200.0 lb

## 2022-10-17 DIAGNOSIS — F419 Anxiety disorder, unspecified: Secondary | ICD-10-CM

## 2022-10-17 DIAGNOSIS — E785 Hyperlipidemia, unspecified: Secondary | ICD-10-CM | POA: Diagnosis not present

## 2022-10-17 DIAGNOSIS — F32A Depression, unspecified: Secondary | ICD-10-CM

## 2022-10-17 DIAGNOSIS — I10 Essential (primary) hypertension: Secondary | ICD-10-CM | POA: Diagnosis not present

## 2022-10-17 DIAGNOSIS — R7303 Prediabetes: Secondary | ICD-10-CM | POA: Diagnosis not present

## 2022-10-17 DIAGNOSIS — Z8249 Family history of ischemic heart disease and other diseases of the circulatory system: Secondary | ICD-10-CM

## 2022-10-17 DIAGNOSIS — M542 Cervicalgia: Secondary | ICD-10-CM

## 2022-10-17 DIAGNOSIS — Z0000 Encounter for general adult medical examination without abnormal findings: Secondary | ICD-10-CM | POA: Diagnosis not present

## 2022-10-17 DIAGNOSIS — M545 Low back pain, unspecified: Secondary | ICD-10-CM

## 2022-10-17 DIAGNOSIS — M5412 Radiculopathy, cervical region: Secondary | ICD-10-CM | POA: Diagnosis not present

## 2022-10-17 DIAGNOSIS — R002 Palpitations: Secondary | ICD-10-CM | POA: Diagnosis not present

## 2022-10-17 DIAGNOSIS — G8929 Other chronic pain: Secondary | ICD-10-CM

## 2022-10-17 MED ORDER — GABAPENTIN 300 MG PO CAPS: 300.0000 mg | ORAL_CAPSULE | Freq: Three times a day (TID) | ORAL | 0 refills | Status: DC

## 2022-10-17 NOTE — Assessment & Plan Note (Signed)
Controlled.  Continue venlafaxine ER 187.5 mg daily.

## 2022-10-17 NOTE — Progress Notes (Signed)
Subjective:    Patient ID: Elaine Young, female    DOB: [birth year], 62 y.o.   MRN: [id number]  HPI  Elaine Young is a very pleasant 62 y.o. female who presents today for complete physical and follow up of chronic conditions.  Immunizations: -Tetanus: Completed in 2017 -Shingles: Completed Shingrix series  Diet: Fair diet.  Exercise: No regular exercise.  Eye exam: Completes annually  Dental exam: Completes semi-annually    Pap Smear: Hysterectomy  Mammogram: Scheduled for May 2024  Colonoscopy: Never completed, declined last year. Opted for Cologuard and never completed. Declines today.   BP Readings from Last 3 Encounters:  10/17/22 132/78  08/29/22 122/76  08/23/22 125/77      Review of Systems  Constitutional:  Negative for unexpected weight change.  HENT:  Negative for rhinorrhea.   Eyes:  Negative for visual disturbance.  Respiratory:  Negative for cough and shortness of breath.   Cardiovascular:  Negative for chest pain.  Gastrointestinal:  Negative for constipation and diarrhea.  Genitourinary:  Negative for difficulty urinating.  Musculoskeletal:  Positive for arthralgias and neck pain.  Skin:  Negative for rash.  Allergic/Immunologic: Negative for environmental allergies.  Neurological:  Positive for numbness and headaches. Negative for dizziness.  Psychiatric/Behavioral:  The patient is not nervous/anxious.          Past Medical History:  Diagnosis Date   Acne vulgaris 09/12/2021   Carpal tunnel syndrome    Cervical disc disorder    Complication of anesthesia    Essential hypertension    Functional incontinence    Generalized anxiety disorder    Hyperlipidemia    Intractable vomiting with nausea 08/22/2022   Lower extremity numbness    Lumbar disc disease    Migraines    Norovirus 08/21/2022   PONV (postoperative nausea and vomiting)    per pt, she usually gets zofran and scopalamine patch    Post-viral cough syndrome  08/02/2021   Renal disorder    kidney stone   Seasonal allergies    Vitamin D deficiency     Social History   Socioeconomic History   Marital status: Married    Spouse name: Not on file   Number of children: Not on file   Years of education: Not on file   Highest education level: Not on file  Occupational History   Not on file  Tobacco Use   Smoking status: Never   Smokeless tobacco: Never  Vaping Use   Vaping Use: Never used  Substance and Sexual Activity   Alcohol use: Yes    Alcohol/week: 0.0 standard drinks of alcohol   Drug use: No   Sexual activity: Not on file  Other Topics Concern   Not on file  Social History Narrative   Married.   1 child. 1 grandchild.   Retired.    Enjoys reading.    Social Determinants of Health   Financial Resource Strain: Not on file  Food Insecurity: No Food Insecurity (08/21/2022)   Hunger Vital Sign    Worried About Running Out of Food in the Last Year: Never true    Ran Out of Food in the Last Year: Never true  Transportation Needs: No Transportation Needs (08/21/2022)   PRAPARE - Administrator, Civil Service (Medical): No    Lack of Transportation (Non-Medical): No  Physical Activity: Not on file  Stress: Not on file  Social Connections: Not on file  Intimate Partner Violence: Not At Risk (08/21/2022)  Humiliation, Afraid, Rape, and Kick questionnaire    Fear of Current or Ex-Partner: No    Emotionally Abused: No    Physically Abused: No    Sexually Abused: No    Past Surgical History:  Procedure Laterality Date   ABDOMINAL EXPOSURE N/A 11/16/2019   Procedure: ABDOMINAL EXPOSURE;  Surgeon: Cephus Shelling, MD;  Location: [facility] OR;  Service: Vascular;  Laterality: N/A;   ABDOMINAL HYSTERECTOMY     ANTERIOR LUMBAR FUSION N/A 11/16/2019   Procedure: LUMBAR FIVE-SACRAL ONE ANTERIOR LUMBAR INTERBODY FUSION;  Surgeon: Tia Alert, MD;  Location: [facility] OR;  Service: Neurosurgery;  Laterality: N/A;  anterior  approach   BLADDER SUSPENSION  2005   BREAST BIOPSY Bilateral yrs ago in [place]   high risk lesions, not cancer per pt   BREAST CYST EXCISION Bilateral yrs ago in [place]   hard to see some excision areas   CHOLECYSTECTOMY     TONSILLECTOMY      Family History  Problem Relation Age of Onset   Hyperlipidemia Mother    Breast cancer Mother 45   Ovarian cancer Maternal Aunt    Lung cancer Maternal Uncle    Arthritis Maternal Grandmother    Arthritis Maternal Grandfather    Colon cancer Maternal Grandfather    Arthritis Paternal Grandmother    Hypertension Paternal Grandmother    Diabetes Paternal Grandmother    Arthritis Paternal Grandfather    Diabetes Paternal Grandfather    Arthritis Cousin    Breast cancer Cousin        maternal side x2    Allergies  Allergen Reactions   Codeine Nausea And Vomiting   Latex Rash   Lipitor [Atorvastatin] Rash   Shellfish Allergy Swelling and Rash   Sulfa Antibiotics Rash    Current Outpatient Medications on File Prior to Visit  Medication Sig Dispense Refill   acetaminophen (TYLENOL) 500 MG tablet Take 500 mg by mouth every 6 (six) hours as needed (for pain.).     amLODipine (NORVASC) 5 MG tablet TAKE 1 TABLET BY MOUTH DAILY FOR BLOOD PRESSURE 90 tablet 0   ibuprofen (ADVIL) 200 MG tablet Take 800 mg by mouth every 6 (six) hours as needed for headache or moderate pain.     rosuvastatin (CRESTOR) 10 MG tablet TAKE 1 TABLET BY MOUTH DAILY FOR CHOLESTEROL 90 tablet 0   venlafaxine XR (EFFEXOR-XR) 150 MG 24 hr capsule TAKE 1 CAPSULE BY MOUTH DAILY  WITH BREAKFAST FOR ANXIETY AND  DEPRESSION TAKE WITH 37.5 MG 90 capsule 0   venlafaxine XR (EFFEXOR-XR) 37.5 MG 24 hr capsule TAKE 1 CAPSULE BY MOUTH DAILY  WITH BREAKFAST FOR DEPRESSION  AND ANXIETY TAKE WITH 150 MG 90 capsule 0   No current facility-administered medications on file prior to visit.    BP 132/78   Pulse 88   Temp (!) 97.5 F (36.4 C) (Temporal)   Ht  (1.727 m)   Wt 200 lb  (90.7 kg)   SpO2 97%   BMI 30.41 kg/m  Objective:   Physical Exam HENT:     Right Ear: Tympanic membrane and ear canal normal.     Left Ear: Tympanic membrane and ear canal normal.     Nose: Nose normal.  Eyes:     Conjunctiva/sclera: Conjunctivae normal.     Pupils: Pupils are equal, round, and reactive to light.  Neck:     Thyroid: No thyromegaly.  Cardiovascular:     Rate and Rhythm: Normal rate and  regular rhythm.     Heart sounds: No murmur heard. Pulmonary:     Effort: Pulmonary effort is normal.     Breath sounds: Normal breath sounds. No rales.  Abdominal:     General: Bowel sounds are normal.     Palpations: Abdomen is soft.     Tenderness: There is no abdominal tenderness.  Musculoskeletal:        General: Normal range of motion.     Cervical back: Neck supple.  Lymphadenopathy:     Cervical: No cervical adenopathy.  Skin:    General: Skin is warm and dry.     Findings: No rash.  Neurological:     Mental Status: She is alert and oriented to person, place, and time.     Cranial Nerves: No cranial nerve deficit.     Deep Tendon Reflexes: Reflexes are normal and symmetric.  Psychiatric:        Mood and Affect: Mood normal.           Assessment & Plan:  Essential hypertension Assessment & Plan: Controlled.  Continue amlodipine 5 mg daily.   Palpitations Assessment & Plan: Continued.  Scheduled with cardiology.   Prediabetes Assessment & Plan: Repeat A1C pending.  Discussed the importance of a healthy diet and regular exercise in order for weight loss, and to reduce the risk of further co-morbidity.   Orders: -     Hemoglobin A1c  Chronic neck pain Assessment & Plan: Continued. Also with left upper extremity radiculopathy.  Following with neurosurgery.  Continue gabapentin 300 mg TID. She is considering surgery.   Chronic bilateral low back pain, unspecified whether sciatica present Assessment & Plan: Controlled.  Continue  gabapentin 300 mg TID.  Orders: -     Gabapentin; Take 1 capsule (300 mg total) by mouth 3 (three) times daily. For pain.  Dispense: 270 capsule; Refill: 0  Family history of brain aneurysm Assessment & Plan: Asymptomatic.  Due for repeat CT scan.  Orders placed.    Orders: -     CT ANGIO HEAD NECK W WO CM; Future  Hyperlipidemia, unspecified hyperlipidemia type Assessment & Plan: Repeat lipid panel pending.  Discussed the importance of a healthy diet and regular exercise in order for weight loss, and to reduce the risk of further co-morbidity. Continue rosuvastatin 10 mg daily.   Orders: -     Lipid panel  Anxiety and depression Assessment & Plan: Controlled.  Continue venlafaxine ER 187.5 mg daily.   Preventative health care Assessment & Plan: Immunizations UTD.  Mammogram scheduled.  Colonoscopy overdue, she declines colonoscopy and cologuard despite recommendations  Discussed the importance of a healthy diet and regular exercise in order for weight loss, and to reduce the risk of further co-morbidity.  Exam stable. Labs pending.  Follow up in 1 year for repeat physical.          Doreene Nest, NP

## 2022-10-17 NOTE — Assessment & Plan Note (Addendum)
Continued. Also with left upper extremity radiculopathy.  Following with neurosurgery.  Continue gabapentin 300 mg TID. She is considering surgery.

## 2022-10-17 NOTE — Assessment & Plan Note (Signed)
Immunizations UTD.  Mammogram scheduled.  Colonoscopy overdue, she declines colonoscopy and cologuard despite recommendations  Discussed the importance of a healthy diet and regular exercise in order for weight loss, and to reduce the risk of further co-morbidity.  Exam stable. Labs pending.  Follow up in 1 year for repeat physical.

## 2022-10-17 NOTE — Assessment & Plan Note (Addendum)
Continued.  Scheduled with cardiology.

## 2022-10-17 NOTE — Addendum Note (Signed)
Addended by: Alvina Chou on: 10/17/2022 08:40 AM   Modules accepted: Orders

## 2022-10-17 NOTE — Assessment & Plan Note (Signed)
Asymptomatic.  Due for repeat CT scan.  Orders placed.

## 2022-10-17 NOTE — Assessment & Plan Note (Signed)
Repeat lipid panel pending.  Discussed the importance of a healthy diet and regular exercise in order for weight loss, and to reduce the risk of further co-morbidity. Continue rosuvastatin 10 mg daily. 

## 2022-10-17 NOTE — Assessment & Plan Note (Signed)
Repeat A1C pending.  Discussed the importance of a healthy diet and regular exercise in order for weight loss, and to reduce the risk of further co-morbidity.  

## 2022-10-17 NOTE — Patient Instructions (Signed)
Stop by the lab prior to leaving today. I will notify you of your results once received.   You will be contacted via phone regarding your CT scan.  It was a pleasure to see you today!  

## 2022-10-17 NOTE — Assessment & Plan Note (Signed)
Controlled. Continue amlodipine 5mg daily.

## 2022-10-17 NOTE — Assessment & Plan Note (Signed)
Controlled.  Continue gabapentin 300 mg TID.

## 2022-10-18 LAB — LIPID PANEL
Chol/HDL Ratio: 3.1 ratio (ref 0.0–4.4)
Cholesterol, Total: 178 mg/dL (ref 100–199)
HDL: 58 mg/dL (ref >39)
LDL Chol Calc (NIH): 96 mg/dL (ref 0–99)
Triglycerides: 138 mg/dL (ref 0–149)
VLDL Cholesterol Cal: 24 mg/dL (ref 5–40)

## 2022-10-18 LAB — HEMOGLOBIN A1C
Est. average glucose Bld gHb Est-mCnc: 143 mg/dL
Hgb A1c MFr Bld: 6.6 % — ABNORMAL HIGH (ref 4.8–5.6)

## 2022-10-19 ENCOUNTER — Other Ambulatory Visit: Payer: Self-pay | Admitting: Primary Care

## 2022-10-19 DIAGNOSIS — F419 Anxiety disorder, unspecified: Secondary | ICD-10-CM

## 2022-10-19 DIAGNOSIS — F411 Generalized anxiety disorder: Secondary | ICD-10-CM

## 2022-10-31 ENCOUNTER — Ambulatory Visit
Admission: RE | Admit: 2022-10-31 | Discharge: 2022-10-31 | Disposition: A | Discharge status: Home / Self Care | Payer: BLUE CROSS/BLUE SHIELD | Source: Ambulatory Visit | Attending: Primary Care | Admitting: Primary Care

## 2022-10-31 DIAGNOSIS — Z1231 Encounter for screening mammogram for malignant neoplasm of breast: Secondary | ICD-10-CM | POA: Diagnosis not present

## 2022-10-31 DIAGNOSIS — M5412 Radiculopathy, cervical region: Secondary | ICD-10-CM | POA: Diagnosis not present

## 2022-11-05 ENCOUNTER — Encounter: Payer: Self-pay | Admitting: Cardiovascular Disease

## 2022-11-05 NOTE — Progress Notes (Signed)
Cardiology Office Note:     Date:  11/07/2022   ID:  Elaine Young, DOB [birth year], MRN [id number]  PCP:  Elaine Nest, NP   [facility] Providers Cardiologist:  previous Elaine Young, new to Elaine Young  Click to update primary MD,subspecialty MD or APP then REFRESH:1}    Referring MD: Elaine Nest, NP   Chief Complaint  Patient presents with   Tachycardia    History of Present Illness:   Nov 06, 2022    Elaine Young is a 62 y.o. 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  She has been on Crestor for years.  Her last lipid profile from 2 weeks ago shows an LDL of 96.  Total cholesterol is 178.  HDL is 58.  Triglyceride levels 138.    Fam hx  Aunt Marie - Mi at age in 40s, murmur Uncle Louis - CABG  Uncle tony - MI 60-70s Mother  - Afib  Fathers mother - heart issues  Father - brain aneurism  age 77  Aunt Myriam Jacobson (paternal aunt ) - MI     Past Medical History:  Diagnosis Date   Acne vulgaris 09/12/2021   Carpal tunnel syndrome    Cervical disc disorder    Complication of anesthesia    Essential hypertension    Functional incontinence    Generalized anxiety disorder    Hyperlipidemia    Intractable vomiting with nausea 08/22/2022   Lower extremity numbness    Lumbar disc disease    Migraines    Norovirus 08/21/2022   PONV (postoperative nausea and vomiting)    per pt, she usually gets zofran and scopalamine patch    Post-viral cough syndrome 08/02/2021   Renal disorder    kidney stone   Seasonal allergies    Vitamin D deficiency     Past Surgical History:  Procedure Laterality Date   ABDOMINAL EXPOSURE N/A 11/16/2019   Procedure: ABDOMINAL  EXPOSURE;  Surgeon: Cephus Shelling, MD;  Location: [facility] OR;  Service: Vascular;  Laterality: N/A;   ABDOMINAL HYSTERECTOMY     ANTERIOR LUMBAR FUSION N/A 11/16/2019   Procedure: LUMBAR FIVE-SACRAL ONE ANTERIOR LUMBAR INTERBODY FUSION;  Surgeon: Tia Alert, MD;  Location: [facility] OR;  Service: Neurosurgery;  Laterality: N/A;  anterior approach   BLADDER SUSPENSION  2005   BREAST BIOPSY Bilateral yrs ago in [place]   high risk lesions, not cancer per pt   BREAST CYST EXCISION Bilateral yrs ago in [place]   hard to see some excision areas   CHOLECYSTECTOMY     TONSILLECTOMY      Current Medications: Current Meds  Medication Sig   acetaminophen (TYLENOL) 500 MG tablet Take 500 mg by mouth every 6 (six) hours as needed (for pain.).   amLODipine (NORVASC) 5 MG tablet TAKE 1 TABLET BY MOUTH DAILY FOR BLOOD PRESSURE   gabapentin (NEURONTIN) 300 MG capsule Take 1 capsule (300 mg total) by mouth 3 (three) times daily. For pain.   ibuprofen (ADVIL) 200 MG tablet Take 800 mg by mouth every 6 (six) hours as needed for headache or moderate  pain.   metoprolol succinate (TOPROL XL) 25 MG 24 hr tablet Take 1 tablet (25 mg total) by mouth daily.   rosuvastatin (CRESTOR) 10 MG tablet TAKE 1 TABLET BY MOUTH DAILY FOR CHOLESTEROL   venlafaxine XR (EFFEXOR-XR) 150 MG 24 hr capsule TAKE 1 CAPSULE BY MOUTH DAILY  WITH BREAKFAST FOR ANXIETY AND  DEPRESSION TAKE WITH 37.5 MG   venlafaxine XR (EFFEXOR-XR) 37.5 MG 24 hr capsule TAKE 1 CAPSULE BY MOUTH DAILY  WITH BREAKFAST FOR DEPRESSION  AND ANXIETY TAKE WITH 150 MG     Allergies:   Codeine, Latex, Lipitor [atorvastatin], Shellfish allergy, and Sulfa antibiotics   Social History   Socioeconomic History   Marital status: Married    Spouse name: Not on file   Number of children: Not on file   Years of education: Not on file   Highest education level: Not on file  Occupational History   Not on file  Tobacco Use   Smoking status: Never   Smokeless tobacco: Never   Vaping Use   Vaping Use: Never used  Substance and Sexual Activity   Alcohol use: Yes    Alcohol/week: 0.0 standard drinks of alcohol   Drug use: No   Sexual activity: Not on file  Other Topics Concern   Not on file  Social History Narrative   Married.   1 child. 1 grandchild.   Retired.    Enjoys reading.    Social Determinants of Health   Financial Resource Strain: Not on file  Food Insecurity: No Food Insecurity (08/21/2022)   Hunger Vital Sign    Worried About Running Out of Food in the Last Year: Never true    Ran Out of Food in the Last Year: Never true  Transportation Needs: No Transportation Needs (08/21/2022)   PRAPARE - Administrator, Civil Service (Medical): No    Lack of Transportation (Non-Medical): No  Physical Activity: Not on file  Stress: Not on file  Social Connections: Not on file     Family History: The patient's family history includes Arthritis in her cousin, maternal grandfather, maternal grandmother, paternal grandfather, and paternal grandmother; Breast cancer in her cousin; Breast cancer (age of onset: 40) in her mother; Colon cancer in her maternal grandfather; Diabetes in her paternal grandfather and paternal grandmother; Hyperlipidemia in her mother; Hypertension in her paternal grandmother; Lung cancer in her maternal uncle; Ovarian cancer in her maternal aunt.  ROS:   Please see the history of present illness.     All other systems reviewed and are negative.  EKGs/Labs/Other Studies Reviewed:    The following studies were reviewed today:   EKG:   08/21/22   sinus tach at 115   .  NS ST / T wave abn.      Recent Labs: 08/22/2022: ALT 31 08/23/2022: Magnesium 1.9 08/29/2022: BUN 11; Creatinine, Ser 0.69; Hemoglobin 13.4; Platelets 345.0; Potassium 4.4; Sodium 139  Recent Lipid Panel    Component Value Date/Time   CHOL 178 10/17/2022 0841   TRIG 138 10/17/2022 0841   HDL 58 10/17/2022 0841   CHOLHDL 3.1 10/17/2022 0841    CHOLHDL 3 09/12/2021 1127   VLDL 20.4 09/12/2021 1127   LDLCALC 96 10/17/2022 0841     Risk Assessment/Calculations:                Physical Exam:    VS:  BP 138/76   Pulse 91   Ht 5\' 8"  (1.727 m)   Wt 201  lb (91.2 kg)   SpO2 96%   BMI 30.56 kg/m     Wt Readings from Last 3 Encounters:  11/07/22 201 lb (91.2 kg)  10/17/22 200 lb (90.7 kg)  08/29/22 201 lb (91.2 kg)     GEN:  Well nourished, well developed in no acute distress HEENT: Normal NECK: No JVD; No carotid bruits LYMPHATICS: No lymphadenopathy CARDIAC: RRR, no murmurs, rubs, gallops RESPIRATORY:  Clear to auscultation without rales, wheezing or rhonchi  ABDOMEN: Soft, non-tender, non-distended MUSCULOSKELETAL:  No edema; No deformity  SKIN: Warm and dry NEUROLOGIC:  Alert and oriented x 3 PSYCHIATRIC:  Normal affect   ASSESSMENT:    1. Primary hypertension   2. Tachycardia   3. Family history of coronary artery disease    PLAN:    In order of problems listed above:  Tachycardia: She had tachycardia when she was admitted to the hospital.  I suspect she had a UTI or perhaps a renal stone.  In any case I think that her tachycardia was related to her urologic issue.  She still remains a little bit faster than normal.  Blood pressure remains high.  Will add Toprol-XL 25 mg a day to help with her tachycardia and her hypertension.  I think some of this may also be due to deconditioning.  I have advised her to start an exercise program.  She is still walking at least 3 to 4 days a week and will progress each week.  This will also help with weight loss cholesterol management and blood pressure management.  2.  Hypertension: Will add Toprol-XL 25 mg a day.  I encouraged her to work on weight loss and exercise program.  3.  Family history of coronary artery disease: She has a large family history of coronary disease.  Will get a coronary calcium score.  This may help guide [place] in our lipid management.            Medication Adjustments/Labs and Tests Ordered: Current medicines are reviewed at length with the patient today.  Concerns regarding medicines are outlined above.  Orders Placed This Encounter  Procedures   CT CARDIAC SCORING (SELF PAY ONLY)   Meds ordered this encounter  Medications   metoprolol succinate (TOPROL XL) 25 MG 24 hr tablet    Sig: Take 1 tablet (25 mg total) by mouth daily.    Dispense:  90 tablet    Refill:  3    Patient Instructions  Medication Instructions:  START Metoprolol Succinate (Toprol XL ) 25mg  daily *If you need a refill on your cardiac medications before your next appointment, please call your pharmacy*  Lab Work: NONE If you have labs (blood work) drawn today and your tests are completely normal, you will receive your results only by: MyChart Message (if you have MyChart) OR A paper copy in the mail If you have any lab test that is abnormal or we need to change your treatment, we will call you to review the results.  Testing/Procedures: Coronary Calcium Score CT Your physician has requested that you have cardiac CT. Cardiac computed tomography (CT) is a painless test that uses an x-ray machine to take clear, detailed pictures of your heart. For further information please visit [email or web address] Please follow instruction sheet as given.  Follow-Up: At [facility], you and your health needs are our priority.  As part of our continuing mission to provide you with exceptional heart care, we have created designated Provider Care Teams.  These Care  Teams include your primary Cardiologist (physician) and Advanced Practice Providers (APPs -  Physician Assistants and Nurse Practitioners) who all work together to provide you with the care you need, when you need it.  Your next appointment:   3 month(s)  Provider:   Kristeen Miss, MD     Signed, Kristeen Miss, MD  11/07/2022 11:22 AM    [facility]

## 2022-11-06 DIAGNOSIS — M5412 Radiculopathy, cervical region: Secondary | ICD-10-CM | POA: Diagnosis not present

## 2022-11-07 ENCOUNTER — Encounter: Payer: Self-pay | Admitting: Cardiovascular Disease

## 2022-11-07 ENCOUNTER — Ambulatory Visit: Payer: BLUE CROSS/BLUE SHIELD | Attending: Cardiovascular Disease | Admitting: Cardiovascular Disease

## 2022-11-07 VITALS — BP 138/76 | HR 91 | Ht 68.0 in | Wt 201.0 lb

## 2022-11-07 DIAGNOSIS — Z8249 Family history of ischemic heart disease and other diseases of the circulatory system: Secondary | ICD-10-CM | POA: Diagnosis not present

## 2022-11-07 DIAGNOSIS — I10 Essential (primary) hypertension: Secondary | ICD-10-CM

## 2022-11-07 DIAGNOSIS — R000 Tachycardia, unspecified: Secondary | ICD-10-CM

## 2022-11-07 MED ORDER — METOPROLOL SUCCINATE ER 25 MG PO TB24
25.0000 mg | ORAL_TABLET | Freq: Every day | ORAL | 3 refills | Status: AC
Start: 2022-11-07 — End: ?

## 2022-11-07 NOTE — Patient Instructions (Signed)
Medication Instructions:  START Metoprolol Succinate (Toprol XL ) 25mg  daily *If you need a refill on your cardiac medications before your next appointment, please call your pharmacy*  Lab Work: NONE If you have labs (blood work) drawn today and your tests are completely normal, you will receive your results only by: MyChart Message (if you have MyChart) OR A paper copy in the mail If you have any lab test that is abnormal or we need to change your treatment, we will call you to review the results.  Testing/Procedures: Coronary Calcium Score CT Your physician has requested that you have cardiac CT. Cardiac computed tomography (CT) is a painless test that uses an x-ray machine to take clear, detailed pictures of your heart. For further information please visit [email or web address] Please follow instruction sheet as given.  Follow-Up: At [facility], you and your health needs are our priority.  As part of our continuing mission to provide you with exceptional heart care, we have created designated Provider Care Teams.  These Care Teams include your primary Cardiologist (physician) and Advanced Practice Providers (APPs -  Physician Assistants and Nurse Practitioners) who all work together to provide you with the care you need, when you need it.  Your next appointment:   3 month(s)  Provider:   Kristeen Miss, MD

## 2022-11-13 ENCOUNTER — Other Ambulatory Visit: Payer: Self-pay | Admitting: Primary Care

## 2022-11-13 DIAGNOSIS — G8929 Other chronic pain: Secondary | ICD-10-CM

## 2022-11-13 DIAGNOSIS — Z79899 Other long term (current) drug therapy: Secondary | ICD-10-CM

## 2022-11-13 DIAGNOSIS — M545 Low back pain, unspecified: Secondary | ICD-10-CM

## 2022-11-13 DIAGNOSIS — E785 Hyperlipidemia, unspecified: Secondary | ICD-10-CM

## 2022-11-13 DIAGNOSIS — I10 Essential (primary) hypertension: Secondary | ICD-10-CM

## 2022-11-14 DIAGNOSIS — M5412 Radiculopathy, cervical region: Secondary | ICD-10-CM | POA: Diagnosis not present

## 2022-11-21 ENCOUNTER — Ambulatory Visit
Admission: RE | Admit: 2022-11-21 | Discharge: 2022-11-21 | Disposition: A | Discharge status: Home / Self Care | Payer: Self-pay | Source: Ambulatory Visit | Attending: Cardiovascular Disease | Admitting: Cardiovascular Disease

## 2022-11-21 DIAGNOSIS — I10 Essential (primary) hypertension: Secondary | ICD-10-CM | POA: Insufficient documentation

## 2022-11-21 DIAGNOSIS — M5412 Radiculopathy, cervical region: Secondary | ICD-10-CM | POA: Diagnosis not present

## 2022-11-21 DIAGNOSIS — Z8249 Family history of ischemic heart disease and other diseases of the circulatory system: Secondary | ICD-10-CM | POA: Insufficient documentation

## 2022-11-21 DIAGNOSIS — R000 Tachycardia, unspecified: Secondary | ICD-10-CM | POA: Insufficient documentation

## 2022-11-27 MED ORDER — EZETIMIBE 10 MG PO TABS
10.0000 mg | ORAL_TABLET | Freq: Every day | ORAL | 3 refills | Status: DC
Start: 2022-11-27 — End: 2023-11-07

## 2022-11-27 NOTE — Telephone Encounter (Signed)
-----   Message from Vesta Mixer, MD sent at 11/23/2022  7:46 AM EDT ----- Coronary calcium score is 43.4 ( 78th percentile for age / sex matched controls)  Her last LDL was 96. Continue Rosuvastatin Add zetia 10 mg a day Goal LDL is 50-70 Recheck lipids and ALT in 3 months

## 2022-11-27 NOTE — Telephone Encounter (Signed)
Called and spoke with patient who agrees to plan. Zetia sent to mail order per request, labs entered and scheduled for 03/01/23.

## 2022-11-28 DIAGNOSIS — M5412 Radiculopathy, cervical region: Secondary | ICD-10-CM | POA: Diagnosis not present

## 2022-12-12 ENCOUNTER — Other Ambulatory Visit: Payer: Self-pay | Admitting: Primary Care

## 2022-12-12 DIAGNOSIS — F419 Anxiety disorder, unspecified: Secondary | ICD-10-CM

## 2022-12-12 DIAGNOSIS — M5412 Radiculopathy, cervical region: Secondary | ICD-10-CM | POA: Diagnosis not present

## 2022-12-28 DIAGNOSIS — M4722 Other spondylosis with radiculopathy, cervical region: Secondary | ICD-10-CM | POA: Diagnosis not present

## 2022-12-28 DIAGNOSIS — M5412 Radiculopathy, cervical region: Secondary | ICD-10-CM | POA: Diagnosis not present

## 2022-12-28 DIAGNOSIS — M4802 Spinal stenosis, cervical region: Secondary | ICD-10-CM | POA: Diagnosis not present

## 2023-02-28 ENCOUNTER — Ambulatory Visit: Payer: BLUE CROSS/BLUE SHIELD | Admitting: Cardiovascular Disease

## 2023-03-01 ENCOUNTER — Ambulatory Visit: Payer: BLUE CROSS/BLUE SHIELD

## 2023-03-06 ENCOUNTER — Ambulatory Visit: Payer: BLUE CROSS/BLUE SHIELD | Admitting: Cardiovascular Disease

## 2023-04-10 ENCOUNTER — Ambulatory Visit: Payer: Self-pay | Admitting: Primary Care

## 2023-04-15 IMAGING — CT CT ABD-PELV W/ CM
2 of 5 series · 16 of 46 positions shown, 18 images · IV contrast (omnipaque)
Comparison: 10/16/2020

CLINICAL DATA: Right lower quadrant abdominal pain.

EXAM:
CT ABDOMEN AND PELVIS WITH CONTRAST
TECHNIQUE: Multidetector CT imaging of the abdomen and pelvis was performed
using the standard protocol following bolus administration of
intravenous contrast.
CONTRAST:  100mL OMNIPAQUE IOHEXOL 300 MG/ML  SOLN

[Series 3: abd/ pelvis 5.0 i30f 2 · axial · 0.93mm/px · z∈[+763,+1208]mm · 13 of 101 slices shown, 15 images]
[im 6/101  soft-tissue]
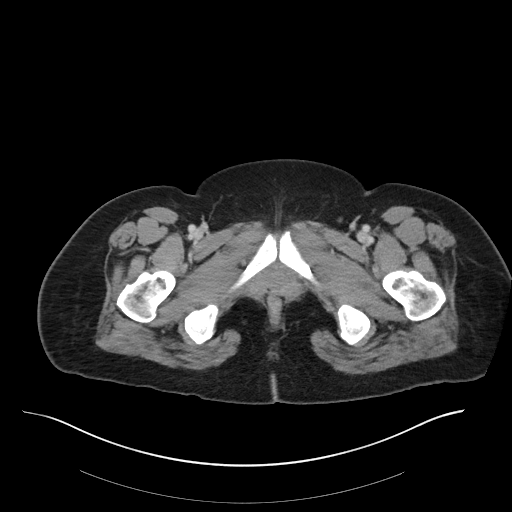
[im 6/101  bone]
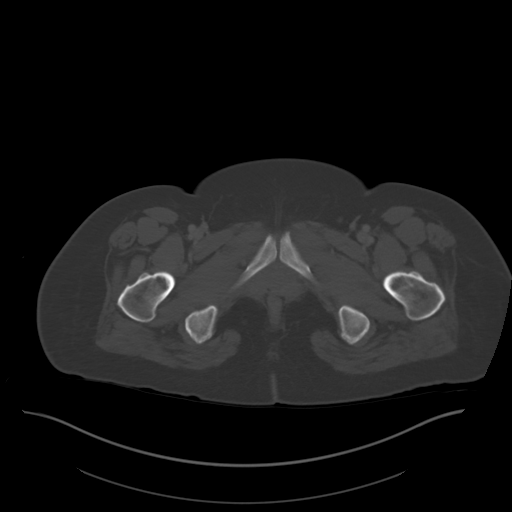
[im 16/101  soft-tissue]
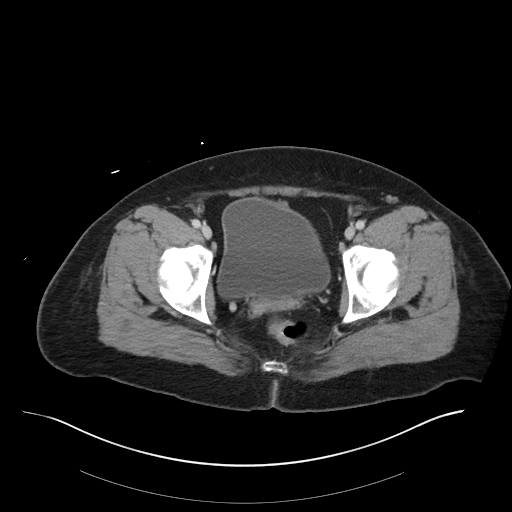
[im 22/101  soft-tissue]
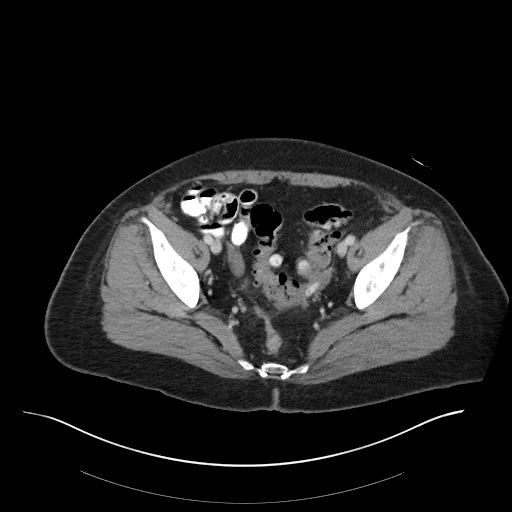
[im 27/101  soft-tissue]
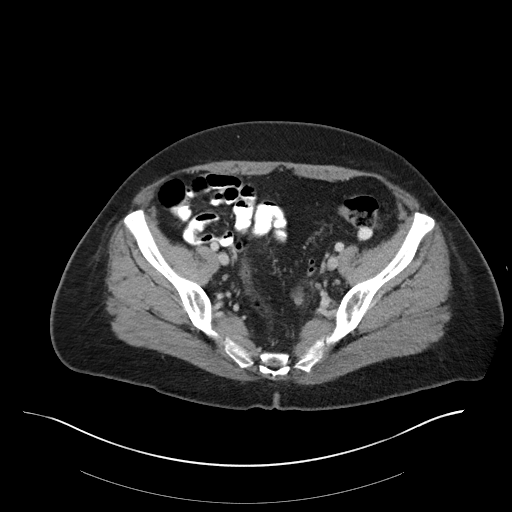
[im 37/101  soft-tissue]
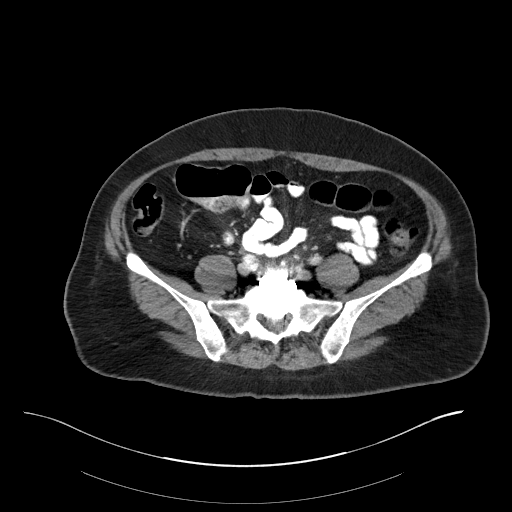
[im 43/101  soft-tissue]
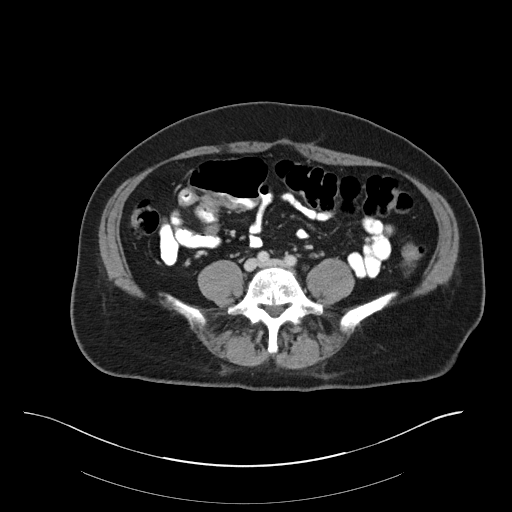
[im 53/101  soft-tissue]
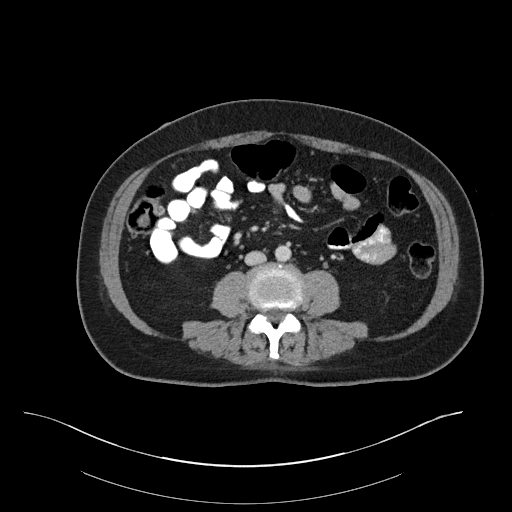
[im 58/101  soft-tissue]
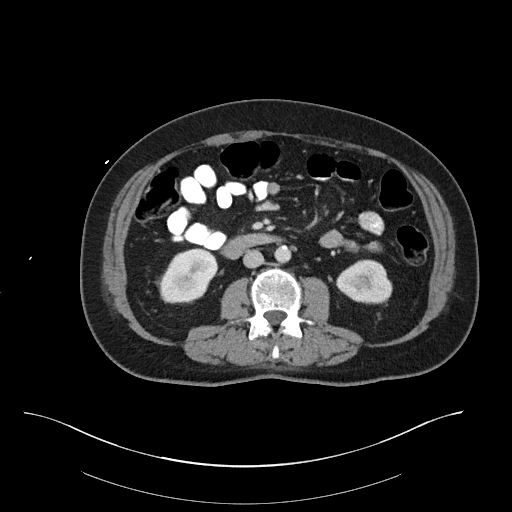
[im 64/101  soft-tissue]
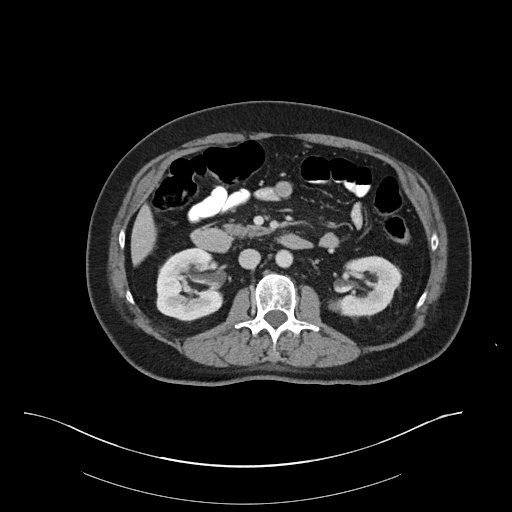
[im 64/101  bone]
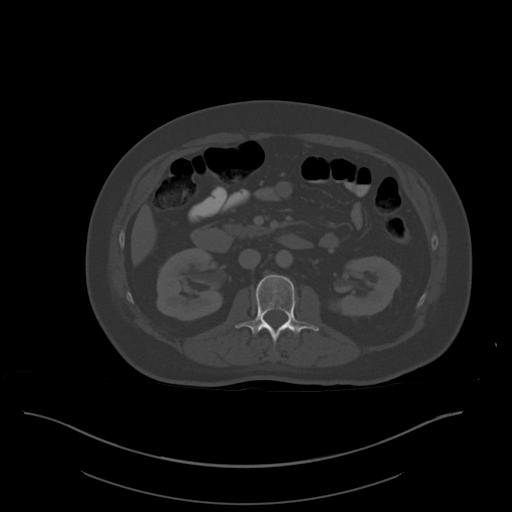
[im 74/101  soft-tissue]
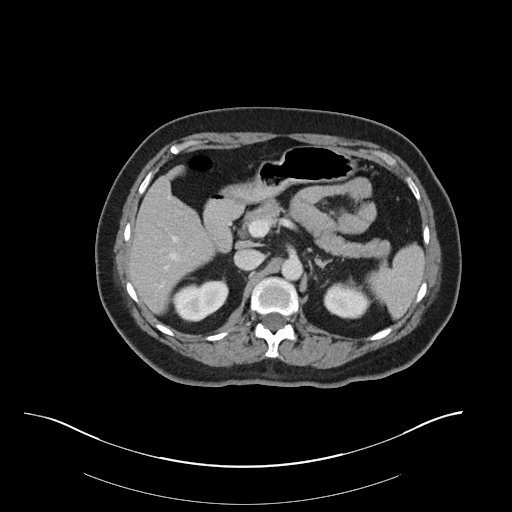
[im 79/101  soft-tissue]
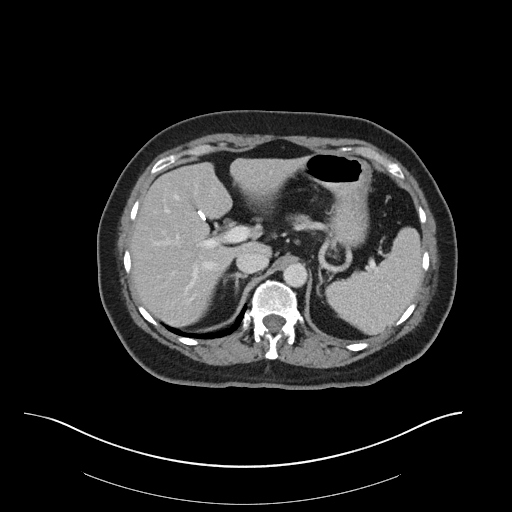
[im 85/101  soft-tissue]
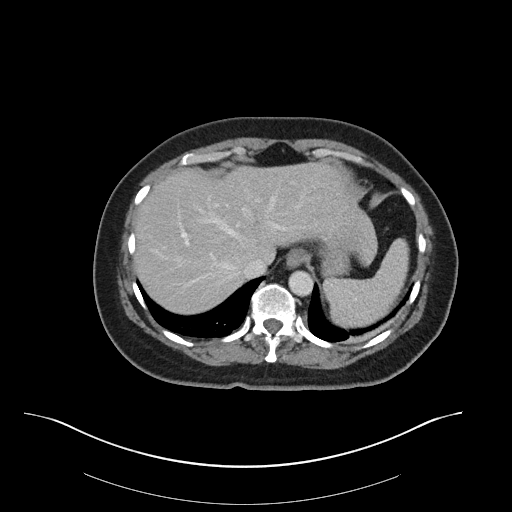
[im 95/101  soft-tissue]
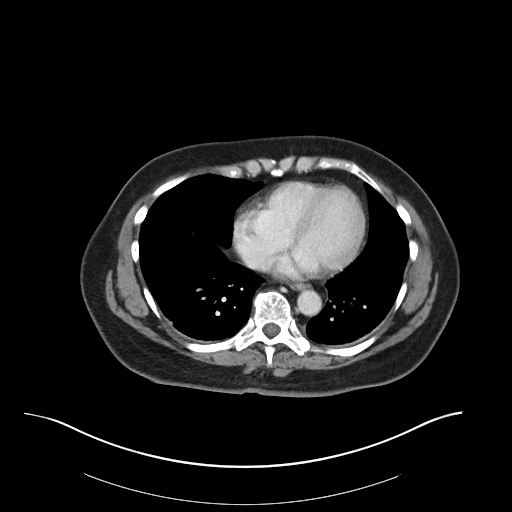

[Series 6: coronal soft tissue · coronal · 0.90mm/px · 3 of 105 slices shown]
[im 35/105  soft-tissue]
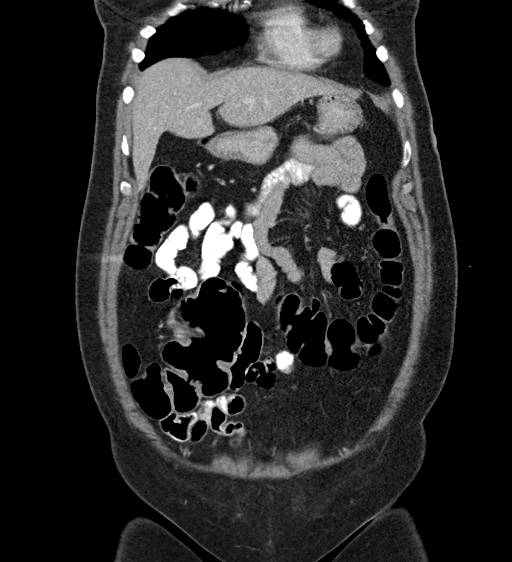
[im 47/105  soft-tissue]
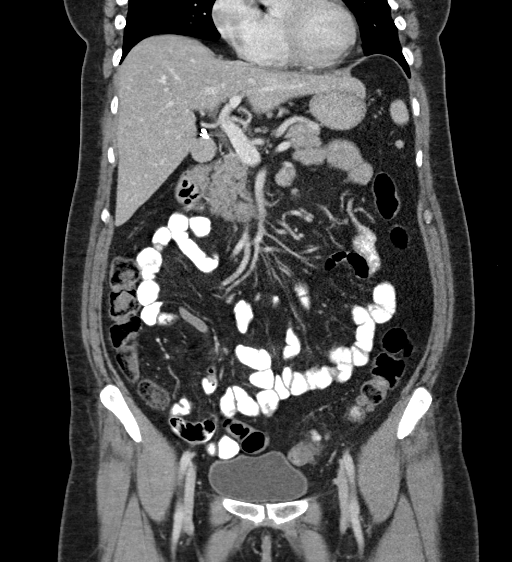
[im 58/105  soft-tissue]
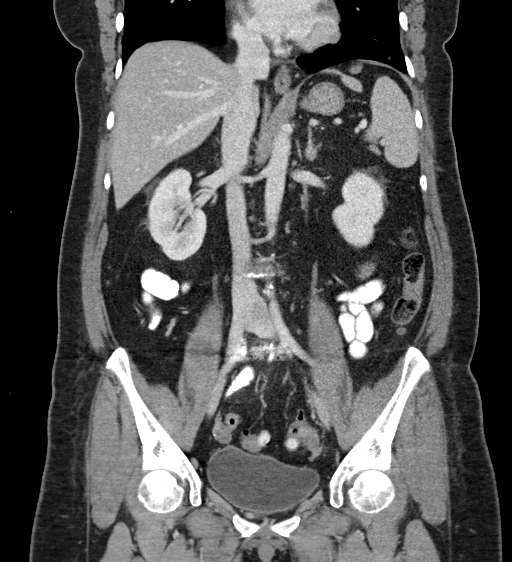

[16 of 46 positions shown; findings below may reference images not displayed]

FINDINGS: Lower chest: Subsegmental atelectasis noted in the lung bases.

Hepatobiliary: No suspicious focal abnormality within the liver
parenchyma. Gallbladder surgically absent. No intrahepatic or
extrahepatic biliary dilation.

Pancreas: No focal mass lesion. No dilatation of the main duct. No
intraparenchymal cyst. No peripancreatic edema.

Spleen: No splenomegaly. No focal mass lesion.

Adrenals/Urinary Tract: No adrenal nodule or mass. 60 mm cyst noted
interpolar right kidney. Left kidney unremarkable. No evidence for
hydroureter. The urinary bladder appears normal for the degree of
distention.

Stomach/Bowel: Stomach is unremarkable. No gastric wall thickening.
No evidence of outlet obstruction. Duodenum is normally positioned
as is the ligament of Treitz. Small duodenal diverticulum noted. No
small bowel wall thickening. No small bowel dilatation. The terminal
ileum is normal. The base of the appendix is upper normal diameter
at 6-7 mm (coronal image 47/series 6) without periappendiceal edema
or inflammation. Mid and distal appendix is decompressed measuring 3
mm diameter. No edema or inflammation in the region of the cecal
tip. No gross colonic mass. No colonic wall thickening. Diverticular
changes are noted in the left colon without evidence of
diverticulitis.

Vascular/Lymphatic: There is abdominal aortic atherosclerosis
without aneurysm. There is no gastrohepatic or hepatoduodenal
ligament lymphadenopathy. No retroperitoneal or mesenteric
lymphadenopathy. No pelvic sidewall lymphadenopathy.

Reproductive: Uterus surgically absent.  There is no adnexal mass.

Other: No intraperitoneal free fluid.

Musculoskeletal: No worrisome lytic or sclerotic osseous
abnormality.
IMPRESSION: 1. No acute findings in the abdomen or pelvis. Specifically, no
findings to explain the patient's history of right lower quadrant
pain.
2. Left colonic diverticulosis without diverticulitis.
3. Aortic Atherosclerosis ([id number]).

## 2023-06-29 ENCOUNTER — Other Ambulatory Visit: Payer: Self-pay | Admitting: Primary Care

## 2023-06-29 DIAGNOSIS — G8929 Other chronic pain: Secondary | ICD-10-CM

## 2023-06-29 DIAGNOSIS — I10 Essential (primary) hypertension: Secondary | ICD-10-CM

## 2023-06-29 DIAGNOSIS — E785 Hyperlipidemia, unspecified: Secondary | ICD-10-CM

## 2023-07-30 ENCOUNTER — Other Ambulatory Visit: Payer: Self-pay | Admitting: Primary Care

## 2023-07-30 DIAGNOSIS — F419 Anxiety disorder, unspecified: Secondary | ICD-10-CM

## 2023-07-31 NOTE — Telephone Encounter (Signed)
Patient is due for CPE/follow up in April, this will be required prior to any further refills.  Please schedule, thank you!

## 2023-07-31 NOTE — Telephone Encounter (Signed)
Patient has been scheduled

## 2023-08-14 ENCOUNTER — Other Ambulatory Visit: Payer: Self-pay | Admitting: Medical Genetics

## 2023-08-15 ENCOUNTER — Other Ambulatory Visit: Payer: Self-pay

## 2023-09-04 ENCOUNTER — Other Ambulatory Visit: Payer: Self-pay | Admitting: Primary Care

## 2023-09-04 DIAGNOSIS — F419 Anxiety disorder, unspecified: Secondary | ICD-10-CM

## 2023-09-04 DIAGNOSIS — F411 Generalized anxiety disorder: Secondary | ICD-10-CM

## 2023-10-22 ENCOUNTER — Encounter: Payer: Self-pay | Admitting: Primary Care

## 2023-11-07 ENCOUNTER — Encounter: Payer: Self-pay | Admitting: Primary Care

## 2023-11-07 ENCOUNTER — Ambulatory Visit (INDEPENDENT_AMBULATORY_CARE_PROVIDER_SITE_OTHER): Payer: Self-pay | Admitting: Primary Care

## 2023-11-07 VITALS — BP 146/82 | HR 81 | Temp 97.6°F | Ht 68.0 in | Wt 210.0 lb

## 2023-11-07 DIAGNOSIS — Z7985 Long-term (current) use of injectable non-insulin antidiabetic drugs: Secondary | ICD-10-CM

## 2023-11-07 DIAGNOSIS — E785 Hyperlipidemia, unspecified: Secondary | ICD-10-CM

## 2023-11-07 DIAGNOSIS — I10 Essential (primary) hypertension: Secondary | ICD-10-CM

## 2023-11-07 DIAGNOSIS — Z0000 Encounter for general adult medical examination without abnormal findings: Secondary | ICD-10-CM | POA: Diagnosis not present

## 2023-11-07 DIAGNOSIS — R002 Palpitations: Secondary | ICD-10-CM

## 2023-11-07 DIAGNOSIS — E1165 Type 2 diabetes mellitus with hyperglycemia: Secondary | ICD-10-CM | POA: Diagnosis not present

## 2023-11-07 DIAGNOSIS — Z1231 Encounter for screening mammogram for malignant neoplasm of breast: Secondary | ICD-10-CM

## 2023-11-07 DIAGNOSIS — Z0001 Encounter for general adult medical examination with abnormal findings: Secondary | ICD-10-CM

## 2023-11-07 DIAGNOSIS — F32A Depression, unspecified: Secondary | ICD-10-CM

## 2023-11-07 DIAGNOSIS — F419 Anxiety disorder, unspecified: Secondary | ICD-10-CM

## 2023-11-07 MED ORDER — AMLODIPINE BESYLATE 10 MG PO TABS
10.0000 mg | ORAL_TABLET | Freq: Every day | ORAL | 3 refills | Status: DC
Start: 2023-11-07 — End: 2023-12-12

## 2023-11-07 MED ORDER — AMLODIPINE BESYLATE 10 MG PO TABS
10.0000 mg | ORAL_TABLET | Freq: Every day | ORAL | 3 refills | Status: DC
Start: 2023-11-07 — End: 2023-11-07

## 2023-11-07 MED ORDER — EZETIMIBE 10 MG PO TABS: 10.0000 mg | ORAL_TABLET | Freq: Every day | ORAL | 3 refills | Status: AC

## 2023-11-07 NOTE — Assessment & Plan Note (Signed)
 Seems like she is experiencing statin myalgias. Will hold rosuvastatin  x 2 weeks, she will update. Continue Zetia  10 mg daily.  Repeat lipid panel pending.

## 2023-11-07 NOTE — Assessment & Plan Note (Signed)
Overall controlled.   Continue to monitor.

## 2023-11-07 NOTE — Assessment & Plan Note (Signed)
Controlled.  Continue venlafaxine ER 187.5 mg daily.

## 2023-11-07 NOTE — Progress Notes (Signed)
 Subjective:    Patient ID: Elaine Elaine, female    DOB: Young 18, 1962, 63 y.o.   MRN: [id number]  HPI  Elaine Elaine is a very pleasant 63 y.o. female who presents today for complete physical and follow up of chronic conditions.  She has been checking her BP at home over the last few months as she hasn't felt "well" BP readings are running 150-170/70-80s. She's noticed headaches, fatigue, hair thinning, and muscle aches. She has a family history of hypertension in her father. Her sister and thyroid  have thyroid  issues. She is compliant to her amlodipine  5 mg daily. She has also noticed muscle aches to her thighs and abdomen for the last several months.   Immunizations: -Tetanus: Completed in 2017 -Shingles: Completed Shingrix series  Diet: Fair diet.  Exercise: No regular exercise.  Eye exam: Completes annually  Dental exam: Completes semi-annually    Pap Smear: Hysterectomy  Mammogram: Completed in May 2024  Colonoscopy: Never completed, declines. Also declines Cologuard.   BP Readings from Last 3 Encounters:  11/07/23 (!) 146/82  11/07/22 138/76  10/17/22 132/78      Review of Systems  Constitutional:  Positive for fatigue. Negative for unexpected weight change.  HENT:  Negative for rhinorrhea.   Respiratory:  Negative for cough and shortness of breath.   Gastrointestinal:  Negative for constipation and diarrhea.  Endocrine: Positive for polydipsia and polyphagia. Negative for polyuria.  Genitourinary:  Negative for difficulty urinating.  Musculoskeletal:  Positive for myalgias. Negative for arthralgias.  Skin:  Negative for rash.  Allergic/Immunologic: Negative for environmental allergies.  Neurological:  Positive for headaches. Negative for dizziness.  Psychiatric/Behavioral:  The patient is not nervous/anxious.          Past Medical History:  Diagnosis Date   Acne vulgaris 09/12/2021   Carpal tunnel syndrome    Cervical disc disorder     Complication of anesthesia    Essential hypertension    Functional incontinence    Generalized anxiety disorder    Hyperlipidemia    Intractable vomiting with nausea 08/22/2022   Lower extremity numbness    Lumbar disc disease    Migraines    Norovirus 08/21/2022   PONV (postoperative nausea and vomiting)    per pt, she usually gets zofran  and scopalamine patch    Post-viral cough syndrome 08/02/2021   Renal disorder    kidney stone   S/P lumbar fusion 11/16/2019   Seasonal allergies    Vitamin D deficiency     Social History   Socioeconomic History   Marital status: Married    Spouse name: Not on file   Number of children: Not on file   Years of education: Not on file   Highest education level: Associate degree: occupational, Scientist, product/process development, or vocational program  Occupational History   Not on file  Tobacco Use   Smoking status: Never   Smokeless tobacco: Never  Vaping Use   Vaping status: Never Used  Substance and Sexual Activity   Alcohol use: Yes    Alcohol/week: 0.0 standard drinks of alcohol   Drug use: No   Sexual activity: Not on file  Other Topics Concern   Not on file  Social History Narrative   Married.   1 child. 1 grandchild.   Retired.    Enjoys reading.    Social Drivers of Health   Financial Resource Strain: Low Risk  (11/06/2023)   Overall Financial Resource Strain (CARDIA)    Difficulty of Paying Living Expenses:  Not hard at all  Food Insecurity: No Food Insecurity (11/06/2023)   Hunger Vital Sign    Worried About Running Out of Food in the Last Year: Never true    Ran Out of Food in the Last Year: Never true  Transportation Needs: No Transportation Needs (11/06/2023)   PRAPARE - Administrator, Civil Service (Medical): No    Lack of Transportation (Non-Medical): No  Physical Activity: Insufficiently Active (11/06/2023)   Exercise Vital Sign    Days of Exercise per Week: 3 days    Minutes of Exercise per Session: 30 min  Stress: Stress  Concern Present (11/06/2023)   [name] of Occupational Health - Occupational Stress Questionnaire    Feeling of Stress : To some extent  Social Connections: Moderately Integrated (11/06/2023)   Social Connection and Isolation Panel [NHANES]    Frequency of Communication with Friends and Family: More than three times a week    Frequency of Social Gatherings with Friends and Family: Three times a week    Attends Religious Services: More than 4 times per year    Active Member of Clubs or Organizations: No    Attends Banker Meetings: Not on file    Marital Status: Married  Intimate Partner Violence: Not At Risk (08/21/2022)   Humiliation, Afraid, Rape, and Kick questionnaire    Fear of Current or Ex-Partner: No    Emotionally Abused: No    Physically Abused: No    Sexually Abused: No    Past Surgical History:  Procedure Laterality Date   ABDOMINAL EXPOSURE N/A 11/16/2019   Procedure: ABDOMINAL EXPOSURE;  Surgeon: Elaine Hensen, MD;  Location: [facility] OR;  Service: Vascular;  Laterality: N/A;   ABDOMINAL HYSTERECTOMY     ANTERIOR LUMBAR FUSION N/A 11/16/2019   Procedure: LUMBAR FIVE-SACRAL ONE ANTERIOR LUMBAR INTERBODY FUSION;  Surgeon: Elaine Mar, MD;  Location: [facility] OR;  Service: Neurosurgery;  Laterality: N/A;  anterior approach   BLADDER SUSPENSION  2005   BREAST BIOPSY Bilateral yrs ago in [place]   high risk lesions, not cancer per pt   BREAST CYST EXCISION Bilateral yrs ago in [place]   hard to see some excision areas   CHOLECYSTECTOMY     Neck fusion  12/28/2022   TONSILLECTOMY      Family History  Problem Relation Age of Onset   Hyperlipidemia Mother    Breast cancer Mother 70   Ovarian cancer Maternal Aunt    Lung cancer Maternal Uncle    Arthritis Maternal Grandmother    Arthritis Maternal Grandfather    Colon cancer Maternal Grandfather    Arthritis Paternal Grandmother    Hypertension Paternal Grandmother    Diabetes Paternal Grandmother     Arthritis Paternal Grandfather    Diabetes Paternal Grandfather    Arthritis Cousin    Breast cancer Cousin        maternal side x2    Allergies  Allergen Reactions   Codeine Nausea And Vomiting   Latex Rash   Lipitor [Atorvastatin] Rash   Shellfish Allergy Swelling and Rash   Sulfa Antibiotics Rash    Current Outpatient Medications on File Prior to Visit  Medication Sig Dispense Refill   acetaminophen  (TYLENOL ) 500 MG tablet Take 500 mg by mouth every 6 (six) hours as needed (for pain.).     ibuprofen (ADVIL) 200 MG tablet Take 800 mg by mouth every 6 (six) hours as needed for headache or moderate pain.  rosuvastatin  (CRESTOR ) 10 MG tablet TAKE 1 TABLET BY MOUTH DAILY FOR CHOLESTEROL 90 tablet 2   venlafaxine  XR (EFFEXOR -XR) 150 MG 24 hr capsule TAKE 1 CAPSULE BY MOUTH DAILY  WITH BREAKFAST FOR ANXIETY AND  DEPRESSION. TAKE WITH 37.5 MG 90 capsule 0   venlafaxine  XR (EFFEXOR -XR) 37.5 MG 24 hr capsule TAKE 1 CAPSULE BY MOUTH DAILY  WITH BREAKFAST FOR DEPRESSION  AND ANXIETY. TAKE WITH 150 MG 90 capsule 2   metoprolol  succinate (TOPROL  XL) 25 MG 24 hr tablet Take 1 tablet (25 mg total) by mouth daily. (Patient not taking: Reported on 11/07/2023) 90 tablet 3   No current facility-administered medications on file prior to visit.    BP (!) 146/82   Pulse 81   Temp 97.6 F (36.4 C) (Temporal)   Ht 5\' 8"  (1.727 m)   Wt 210 lb (95.3 kg)   SpO2 97%   BMI 31.93 kg/m  Objective:   Physical Exam HENT:     Right Ear: Tympanic membrane and ear canal normal.     Left Ear: Tympanic membrane and ear canal normal.  Eyes:     Pupils: Pupils are equal, round, and reactive to light.  Cardiovascular:     Rate and Rhythm: Normal rate and regular rhythm.  Pulmonary:     Effort: Pulmonary effort is normal.     Breath sounds: Normal breath sounds.  Abdominal:     General: Bowel sounds are normal.     Palpations: Abdomen is soft.     Tenderness: There is no abdominal tenderness.   Musculoskeletal:        General: Normal range of motion.     Cervical back: Neck supple.  Skin:    General: Skin is warm and dry.  Neurological:     Mental Status: She is alert and oriented to person, place, and time.     Cranial Nerves: No cranial nerve deficit.     Deep Tendon Reflexes:     Reflex Scores:      Patellar reflexes are 2+ on the right side and 2+ on the left side. Psychiatric:        Mood and Affect: Mood normal.           Assessment & Plan:  Encounter for annual general medical examination with abnormal findings in adult Assessment & Plan: Immunizations UTD. Mammogram due, orders placed. Colonoscopy overdue, she continues to decline despite recommendations.  She also declines Cologuard.  Discussed the importance of a healthy diet and regular exercise in order for weight loss, and to reduce the risk of further co-morbidity.  Exam stable. Labs pending.  Follow up in 1 year for repeat physical.    Screening mammogram for breast cancer -     3D Screening Mammogram, Left and Right; Future  Hyperlipidemia, unspecified hyperlipidemia type Assessment & Plan: Seems like she is experiencing statin myalgias. Will hold rosuvastatin  x 2 weeks, she will update. Continue Zetia  10 mg daily.  Repeat lipid panel pending.  Orders: -     Comprehensive metabolic panel with GFR -     Lipid panel -     Ezetimibe ; Take 1 tablet (10 mg total) by mouth daily.  Dispense: 90 tablet; Refill: 3  Type 2 diabetes mellitus with hyperglycemia, without long-term current use of insulin (HCC) Assessment & Plan: Repeat A1C pending. No follow up since last year despite recommendations. Do suspect A1C to have deteriorated.  We discussed options for treatment if A1c returns uncontrolled. We will  start Ozempic if that is the case.  We discussed instructions for use and potential side effects. Await results.  Urine microalbumin pending.  Orders: -     Hemoglobin A1c -      Microalbumin / creatinine urine ratio  Essential hypertension Assessment & Plan: Above goal today, also with home readings.  Given readings, coupled with symptoms, will treat. Increase amlodipine  to 10 mg daily.  She will update regarding blood pressure readings in 2 weeks.  Orders: -     amLODIPine  Besylate; Take 1 tablet (10 mg total) by mouth daily. for blood pressure.  Dispense: 90 tablet; Refill: 3  Anxiety and depression Assessment & Plan: Controlled.  Continue venlafaxine  ER 187.5 mg daily.   Palpitations Assessment & Plan: Overall controlled. Continue to monitor.         Madalene Mickler K Kajsa Butrum, NP

## 2023-11-07 NOTE — Assessment & Plan Note (Signed)
 Above goal today, also with home readings.  Given readings, coupled with symptoms, will treat. Increase amlodipine  to 10 mg daily.  She will update regarding blood pressure readings in 2 weeks.

## 2023-11-07 NOTE — Assessment & Plan Note (Signed)
 Immunizations UTD. Mammogram due, orders placed. Colonoscopy overdue, she continues to decline despite recommendations.  She also declines Cologuard.  Discussed the importance of a healthy diet and regular exercise in order for weight loss, and to reduce the risk of further co-morbidity.  Exam stable. Labs pending.  Follow up in 1 year for repeat physical.

## 2023-11-07 NOTE — Patient Instructions (Addendum)
 Stop by the lab prior to leaving today. I will notify you of your results once received.   We increased your dose of amlodipine  to 10 mg.  Take 1 tablet by mouth every day for blood pressure.  [facility]   Please schedule a follow up visit for 3 months.  It was a pleasure to see you today!

## 2023-11-07 NOTE — Assessment & Plan Note (Signed)
 Repeat A1C pending. No follow up since last year despite recommendations. Do suspect A1C to have deteriorated.  We discussed options for treatment if A1c returns uncontrolled. We will start Ozempic if that is the case.  We discussed instructions for use and potential side effects. Await results.  Urine microalbumin pending.

## 2023-11-07 NOTE — Addendum Note (Signed)
 Addended by: Darcella Earnest on: 11/07/2023 08:28 AM   Modules accepted: Orders

## 2023-11-08 DIAGNOSIS — E1165 Type 2 diabetes mellitus with hyperglycemia: Secondary | ICD-10-CM

## 2023-11-08 DIAGNOSIS — E785 Hyperlipidemia, unspecified: Secondary | ICD-10-CM

## 2023-11-08 LAB — COMPREHENSIVE METABOLIC PANEL WITH GFR
ALT: 28 IU/L (ref 0–32)
AST: 22 IU/L (ref 0–40)
Albumin: 4.6 g/dL (ref 3.9–4.9)
Alkaline Phosphatase: 108 IU/L (ref 44–121)
BUN/Creatinine Ratio: 14 (ref 12–28)
BUN: 10 mg/dL (ref 8–27)
Bilirubin Total: 0.3 mg/dL (ref 0.0–1.2)
CO2: 20 mmol/L (ref 20–29)
Calcium: 9.3 mg/dL (ref 8.7–10.3)
Chloride: 105 mmol/L (ref 96–106)
Creatinine, Ser: 0.69 mg/dL (ref 0.57–1.00)
Globulin, Total: 2.4 g/dL (ref 1.5–4.5)
Glucose: 139 mg/dL — ABNORMAL HIGH (ref 70–99)
Potassium: 4.4 mmol/L (ref 3.5–5.2)
Sodium: 139 mmol/L (ref 134–144)
Total Protein: 7 g/dL (ref 6.0–8.5)
eGFR: 97 mL/min/{1.73_m2} (ref >59)

## 2023-11-08 LAB — MICROALBUMIN / CREATININE URINE RATIO
Creatinine, Urine: 79.7 mg/dL
Microalb/Creat Ratio: 108 mg/g{creat} — ABNORMAL HIGH (ref 0–29)
Microalbumin, Urine: 86.1 ug/mL

## 2023-11-08 LAB — LIPID PANEL
Chol/HDL Ratio: 5.5 ratio — ABNORMAL HIGH (ref 0.0–4.4)
Cholesterol, Total: 278 mg/dL — ABNORMAL HIGH (ref 100–199)
HDL: 51 mg/dL (ref >39)
LDL Chol Calc (NIH): 196 mg/dL — ABNORMAL HIGH (ref 0–99)
Triglycerides: 167 mg/dL — ABNORMAL HIGH (ref 0–149)
VLDL Cholesterol Cal: 31 mg/dL (ref 5–40)

## 2023-11-08 LAB — HEMOGLOBIN A1C
Est. average glucose Bld gHb Est-mCnc: 146 mg/dL
Hgb A1c MFr Bld: 6.7 % — ABNORMAL HIGH (ref 4.8–5.6)

## 2023-11-08 LAB — TSH: TSH: 3.08 u[IU]/mL (ref 0.450–4.500)

## 2023-11-08 MED ORDER — OZEMPIC (0.25 OR 0.5 MG/DOSE) 2 MG/3ML ~~LOC~~ SOPN
PEN_INJECTOR | SUBCUTANEOUS | 0 refills | Status: DC
Start: 2023-11-08 — End: 2023-11-11

## 2023-11-11 ENCOUNTER — Other Ambulatory Visit (HOSPITAL_COMMUNITY): Payer: Self-pay

## 2023-11-11 ENCOUNTER — Telehealth: Payer: Self-pay

## 2023-11-11 ENCOUNTER — Other Ambulatory Visit: Payer: Self-pay | Admitting: Nurse Practitioner

## 2023-11-11 DIAGNOSIS — E1165 Type 2 diabetes mellitus with hyperglycemia: Secondary | ICD-10-CM

## 2023-11-11 DIAGNOSIS — F411 Generalized anxiety disorder: Secondary | ICD-10-CM

## 2023-11-11 DIAGNOSIS — F32A Depression, unspecified: Secondary | ICD-10-CM

## 2023-11-11 MED ORDER — OZEMPIC (0.25 OR 0.5 MG/DOSE) 2 MG/3ML ~~LOC~~ SOPN
PEN_INJECTOR | SUBCUTANEOUS | 0 refills | Status: DC
Start: 2023-11-11 — End: 2024-02-25

## 2023-11-11 MED ORDER — ATORVASTATIN CALCIUM 40 MG PO TABS
40.0000 mg | ORAL_TABLET | Freq: Every day | ORAL | 0 refills | Status: AC
Start: 2023-11-11 — End: ?

## 2023-11-11 NOTE — Telephone Encounter (Signed)
 Pharmacy Patient Advocate Encounter  Received notification from [employer] that Prior Authorization for Ozempic 2 has been APPROVED from 11/11/23 to 11/23/24. Ran test claim, Copay is $66.00 for 84 day supply. This test claim was processed through [facility]- copay amounts may vary at other pharmacies due to pharmacy/plan contracts, or as the patient moves through the different stages of their insurance plan.   PA #/Case ID/Reference #: [id number]

## 2023-11-11 NOTE — Telephone Encounter (Signed)
 Pharmacy Patient Advocate Encounter   Received notification from Patient Pharmacy that prior authorization for Ozempic  is required/requested.   Insurance verification completed.   The patient is insured through [facility] .   Per test claim: PA required; PA submitted to above mentioned insurance via CoverMyMeds Key/confirmation #/EOC [id number] Status is pending

## 2023-11-21 ENCOUNTER — Ambulatory Visit: Admitting: Dermatology

## 2023-11-21 ENCOUNTER — Encounter: Payer: Self-pay | Admitting: Dermatology

## 2023-11-21 DIAGNOSIS — L821 Other seborrheic keratosis: Secondary | ICD-10-CM | POA: Diagnosis not present

## 2023-11-21 DIAGNOSIS — L738 Other specified follicular disorders: Secondary | ICD-10-CM | POA: Diagnosis not present

## 2023-11-21 NOTE — Patient Instructions (Signed)

## 2023-11-21 NOTE — Progress Notes (Signed)
   Follow-Up Visit   Subjective  Elaine Young is a 63 y.o. female who presents for the following: patient reports after a year of isotretinoin  treatment she feels bumps have returned at face. Hx of acne.   Patient took total of 8,400 mg of isotretinoin  and 88.60 mg/kg   Also reports a dry patch at left shoulder she would like checked.   The patient has spots, moles and lesions to be evaluated, some may be new or changing and the patient may have concern these could be cancer.   The following portions of the chart were reviewed this encounter and updated as appropriate: medications, allergies, medical history  Review of Systems:  No other skin or systemic complaints except as noted in HPI or Assessment and Plan.  Objective  Well appearing patient in no apparent distress; mood and affect are within normal limits.   A focused examination was performed of the following areas: Face, left shoulder   Relevant exam findings are noted in the Assessment and Plan.    Assessment & Plan    Sebaceous Hyperplasia - Small yellow papules with a central dell on cheeks - Benign-appearing and does not require removal - Observe. Call for changes. Isotretinoin  - could shrink oil glands but change may not be permanent. Is not approved for this specific use Discussed laser, chemical peels, electrodesiccation Patient had recurrence after excision of a lesion in the past Patient defers treatment for now  SEBORRHEIC KERATOSIS Left shoulder  - Stuck-on, waxy, tan-brown papules and/or plaques  - Benign-appearing - Discussed benign etiology and prognosis. - Observe - Call for any changes   SEBACEOUS HYPERPLASIA   SEBORRHEIC KERATOSES    Return if symptoms worsen or fail to improve.  I, Randee Busing, CMA, am acting as scribe for Harris Liming, MD.   Documentation: I have reviewed the above documentation for accuracy and completeness, and I agree with the  above.  Harris Liming, MD

## 2023-11-28 DIAGNOSIS — I10 Essential (primary) hypertension: Secondary | ICD-10-CM

## 2023-11-29 MED ORDER — VALSARTAN 80 MG PO TABS
80.0000 mg | ORAL_TABLET | Freq: Every day | ORAL | 0 refills | Status: DC
Start: 2023-11-29 — End: 2024-01-07

## 2023-12-12 ENCOUNTER — Encounter: Payer: Self-pay | Admitting: Primary Care

## 2023-12-12 ENCOUNTER — Ambulatory Visit: Admitting: Primary Care

## 2023-12-12 VITALS — BP 118/74 | HR 76 | Temp 97.3°F | Ht 68.0 in | Wt 200.0 lb

## 2023-12-12 DIAGNOSIS — E1165 Type 2 diabetes mellitus with hyperglycemia: Secondary | ICD-10-CM | POA: Diagnosis not present

## 2023-12-12 DIAGNOSIS — Z794 Long term (current) use of insulin: Secondary | ICD-10-CM

## 2023-12-12 DIAGNOSIS — I10 Essential (primary) hypertension: Secondary | ICD-10-CM

## 2023-12-12 DIAGNOSIS — R310 Gross hematuria: Secondary | ICD-10-CM | POA: Diagnosis not present

## 2023-12-12 LAB — POC URINALSYSI DIPSTICK (AUTOMATED)
Bilirubin, UA: NEGATIVE
Blood, UA: POSITIVE
Glucose, UA: NEGATIVE
Ketones, UA: NEGATIVE
Nitrite, UA: NEGATIVE
Protein, UA: POSITIVE — AB
Spec Grav, UA: 1.015 (ref 1.010–1.025)
Urobilinogen, UA: 0.2 U/dL
pH, UA: 6 (ref 5.0–8.0)

## 2023-12-12 MED ORDER — LANCET DEVICE MISC
1.0000 | Freq: Three times a day (TID) | 0 refills | Status: DC
Start: 2023-12-12 — End: 2023-12-29

## 2023-12-12 MED ORDER — BLOOD GLUCOSE MONITORING SUPPL DEVI
1.0000 | Freq: Three times a day (TID) | 0 refills | Status: DC
Start: 2023-12-12 — End: 2023-12-29

## 2023-12-12 MED ORDER — LANCETS MISC. MISC
1.0000 | Freq: Three times a day (TID) | 5 refills | Status: AC
Start: 2023-12-12 — End: ?

## 2023-12-12 MED ORDER — BLOOD GLUCOSE TEST VI STRP
1.0000 | ORAL_STRIP | Freq: Three times a day (TID) | 5 refills | Status: AC
Start: 2023-12-12 — End: ?

## 2023-12-12 NOTE — Assessment & Plan Note (Signed)
 Controlled!  Continue valsartan  80 mg daily. Remain off amlodipine  10 mg daily.  BMP pending.

## 2023-12-12 NOTE — Progress Notes (Signed)
 Subjective:    Patient ID: Elaine Young, female    DOB: 01-Jul-1961, 63 y.o.   MRN: [id number]  Vaginal Bleeding The patient's pertinent negatives include no vaginal discharge. Associated symptoms include hematuria. Pertinent negatives include no dysuria, frequency, headaches or urgency.    Elaine Young is a very pleasant 63 y.o. female with a history of hypertension, type 2 diabetes, palpitations who presents today to discuss high blood pressure and gross hematuria.   She is also needing a glucometer.   1) Hypertension: Currently managed on amlodipine  10 mg daily, valsartan  80 mg daily.  She was last evaluated in the office on 11/07/2023 for an annual physical, blood pressure was above goal so her amlodipine  was increased to 10 mg daily.  She contacted us  via MyChart on 11/28/2023 with reports of continued elevated blood pressure readings and ankle/pedal edema.  Given this, valsartan  80 mg was prescribed and amlodipine  10 mg was discontinued..  She is here for follow-up today.  Since her last visit she is compliant to valsartan  80 mg daily. Her ankle edema and bloating have resolved. She is checking her BP at home which is running 110-120/80.   BP Readings from Last 3 Encounters:  12/12/23 118/74  11/07/23 (!) 146/82  11/07/22 138/76   2) Gross Hematuria: Acute for the last 2 months. She notices light pink bleeding when wiping after urinating or when leaking urine onto her panty liner. This occurs each time she urinates, does not occur without urination or with bowel movements. She denies unexplained weight loss. She has been improving her exercise and diet.   She was never a smoker. She denies a personal history of cancer but does have a significant family history of various cancers including direct relatives.   Wt Readings from Last 3 Encounters:  12/12/23 200 lb (90.7 kg)  11/07/23 210 lb (95.3 kg)  11/07/22 201 lb (91.2 kg)     Review of Systems  Respiratory:   Negative for shortness of breath.   Cardiovascular:  Negative for chest pain.  Genitourinary:  Positive for hematuria. Negative for dysuria, frequency, urgency, vaginal bleeding and vaginal discharge.  Neurological:  Negative for headaches.         Past Medical History:  Diagnosis Date   Acne vulgaris 09/12/2021   Carpal tunnel syndrome    Cervical disc disorder    Complication of anesthesia    Essential hypertension    Functional incontinence    Generalized anxiety disorder    Hyperlipidemia    Intractable vomiting with nausea 08/22/2022   Lower extremity numbness    Lumbar disc disease    Migraines    Norovirus 08/21/2022   PONV (postoperative nausea and vomiting)    per pt, she usually gets zofran  and scopalamine patch    Post-viral cough syndrome 08/02/2021   Renal disorder    kidney stone   S/P lumbar fusion 11/16/2019   Seasonal allergies    Vitamin D deficiency     Social History   Socioeconomic History   Marital status: Married    Spouse name: Not on file   Number of children: Not on file   Years of education: Not on file   Highest education level: Associate degree: occupational, Scientist, product/process development, or vocational program  Occupational History   Not on file  Tobacco Use   Smoking status: Never   Smokeless tobacco: Never  Vaping Use   Vaping status: Never Used  Substance and Sexual Activity   Alcohol use: Yes  Alcohol/week: 0.0 standard drinks of alcohol   Drug use: No   Sexual activity: Not on file  Other Topics Concern   Not on file  Social History Narrative   Married.   1 child. 1 grandchild.   Retired.    Enjoys reading.    Social Drivers of Corporate investment banker Strain: Low Risk  (11/06/2023)   Overall Financial Resource Strain (CARDIA)    Difficulty of Paying Living Expenses: Not hard at all  Food Insecurity: No Food Insecurity (11/06/2023)   Hunger Vital Sign    Worried About Running Out of Food in the Last Year: Never true    Ran Out of  Food in the Last Year: Never true  Transportation Needs: No Transportation Needs (11/06/2023)   PRAPARE - Administrator, Civil Service (Medical): No    Lack of Transportation (Non-Medical): No  Physical Activity: Insufficiently Active (11/06/2023)   Exercise Vital Sign    Days of Exercise per Week: 3 days    Minutes of Exercise per Session: 30 min  Stress: Stress Concern Present (11/06/2023)   [name] of Occupational Health - Occupational Stress Questionnaire    Feeling of Stress : To some extent  Social Connections: Moderately Integrated (11/06/2023)   Social Connection and Isolation Panel    Frequency of Communication with Friends and Family: More than three times a week    Frequency of Social Gatherings with Friends and Family: Three times a week    Attends Religious Services: More than 4 times per year    Active Member of Clubs or Organizations: No    Attends Banker Meetings: Not on file    Marital Status: Married  Intimate Partner Violence: Not At Risk (08/21/2022)   Humiliation, Afraid, Rape, and Kick questionnaire    Fear of Current or Ex-Partner: No    Emotionally Abused: No    Physically Abused: No    Sexually Abused: No    Past Surgical History:  Procedure Laterality Date   ABDOMINAL EXPOSURE N/A 11/16/2019   Procedure: ABDOMINAL EXPOSURE;  Surgeon: Young Hensen, MD;  Location: [facility] OR;  Service: Vascular;  Laterality: N/A;   ABDOMINAL HYSTERECTOMY     ANTERIOR LUMBAR FUSION N/A 11/16/2019   Procedure: LUMBAR FIVE-SACRAL ONE ANTERIOR LUMBAR INTERBODY FUSION;  Surgeon: Isadora Mar, MD;  Location: [facility];  Service: Neurosurgery;  Laterality: N/A;  anterior approach   BLADDER SUSPENSION  2005   BREAST BIOPSY Bilateral yrs ago in [place]   high risk lesions, not cancer per pt   BREAST CYST EXCISION Bilateral yrs ago in [place]   hard to see some excision areas   CHOLECYSTECTOMY     Neck fusion  12/28/2022   TONSILLECTOMY      Family History   Problem Relation Age of Onset   Hyperlipidemia Mother    Breast cancer Mother 23   Ovarian cancer Maternal Aunt    Lung cancer Maternal Uncle    Arthritis Maternal Grandmother    Arthritis Maternal Grandfather    Colon cancer Maternal Grandfather    Arthritis Paternal Grandmother    Hypertension Paternal Grandmother    Diabetes Paternal Grandmother    Arthritis Paternal Grandfather    Diabetes Paternal Grandfather    Arthritis Cousin    Breast cancer Cousin        maternal side x2    Allergies  Allergen Reactions   Codeine Nausea And Vomiting   Latex Rash   Lipitor [Atorvastatin ]  Rash   Shellfish Allergy Swelling and Rash   Sulfa Antibiotics Rash    Current Outpatient Medications on File Prior to Visit  Medication Sig Dispense Refill   acetaminophen  (TYLENOL ) 500 MG tablet Take 500 mg by mouth every 6 (six) hours as needed (for pain.).     atorvastatin  (LIPITOR) 40 MG tablet Take 1 tablet (40 mg total) by mouth daily. for cholesterol. 90 tablet 0   ezetimibe  (ZETIA ) 10 MG tablet Take 1 tablet (10 mg total) by mouth daily. 90 tablet 3   ibuprofen (ADVIL) 200 MG tablet Take 800 mg by mouth every 6 (six) hours as needed for headache or moderate pain.     metoprolol  succinate (TOPROL  XL) 25 MG 24 hr tablet Take 1 tablet (25 mg total) by mouth daily. 90 tablet 3   Semaglutide ,0.25 or 0.5MG /DOS, (OZEMPIC , 0.25 OR 0.5 MG/DOSE,) 2 MG/3ML SOPN Inject 0.25 mg into the skin once weekly for 4 weeks, then increase to 0.5 mg once weekly thereafter for diabetes. 9 mL 0   valsartan  (DIOVAN ) 80 MG tablet Take 1 tablet (80 mg total) by mouth daily. for blood pressure. 30 tablet 0   venlafaxine  XR (EFFEXOR -XR) 150 MG 24 hr capsule TAKE 1 CAPSULE BY MOUTH DAILY  WITH BREAKFAST FOR ANXIETY AND  DEPRESSION. TAKE WITH  VENLAFAXINE  XR 37.5MG . 90 capsule 3   venlafaxine  XR (EFFEXOR -XR) 37.5 MG 24 hr capsule TAKE 1 CAPSULE BY MOUTH DAILY  WITH BREAKFAST FOR DEPRESSION  AND ANXIETY. TAKE WITH 150 MG 90  capsule 2   No current facility-administered medications on file prior to visit.    BP 118/74   Pulse 76   Temp (!) 97.3 F (36.3 C) (Temporal)   Ht 5' 8 (1.727 m)   Wt 200 lb (90.7 kg)   SpO2 98%   BMI 30.41 kg/m  Objective:   Physical Exam  Cardiovascular:     Rate and Rhythm: Normal rate and regular rhythm.  Pulmonary:     Effort: Pulmonary effort is normal.     Breath sounds: Normal breath sounds.   Musculoskeletal:     Cervical back: Neck supple.   Skin:    General: Skin is warm and dry.   Neurological:     Mental Status: She is alert and oriented to person, place, and time.   Psychiatric:        Mood and Affect: Mood normal.           Assessment & Plan:  Gross hematuria Assessment & Plan: Symptoms do not seem vaginal as she only notices when urinating.   UA today with 1+ leuks, trace blood, otherwise negative. Urine culture and urine microscopic pending.  Consider urology referral if warranted.  Await results.  Orders: -     POCT Urinalysis Dipstick (Automated) -     Urine Microscopic -     Urine Culture  Type 2 diabetes mellitus with hyperglycemia, without long-term current use of insulin (HCC) -     Blood Glucose Monitoring Suppl; 1 each by Does not apply route in the morning, at noon, and at bedtime. May substitute to any manufacturer covered by patient's insurance.  Dispense: 1 each; Refill: 0 -     Blood Glucose Test; 1 each by In Vitro route in the morning, at noon, and at bedtime. May substitute to any manufacturer covered by patient's insurance.  Dispense: 100 strip; Refill: 5 -     Lancet Device; 1 each by Does not apply route in the morning,  at noon, and at bedtime. May substitute to any manufacturer covered by patient's insurance.  Dispense: 1 each; Refill: 0 -     Lancets Misc.; 1 each by Does not apply route in the morning, at noon, and at bedtime. May substitute to any manufacturer covered by patient's insurance.  Dispense: 100 each;  Refill: 5  Essential hypertension Assessment & Plan: Controlled!  Continue valsartan  80 mg daily. Remain off amlodipine  10 mg daily.  BMP pending.  Orders: -     Basic metabolic panel with GFR        Gabriel John, NP

## 2023-12-12 NOTE — Patient Instructions (Addendum)
 Continue taking valsartan  80 mg once daily for blood pressure.  We will be in touch once you receive your urine test result.  Start checking your blood sugar levels.  Appropriate times to check your blood sugar levels are:  -Before any meal (breakfast, lunch, dinner) -Two hours after any meal (breakfast, lunch, dinner) -Bedtime  Record your readings and notify me if you continue to consistently run at or above 150.   It was a pleasure to see you today!

## 2023-12-12 NOTE — Addendum Note (Signed)
 Addended by: Bernadene Brewer on: 12/12/2023 12:27 PM   Modules accepted: Orders

## 2023-12-12 NOTE — Assessment & Plan Note (Addendum)
 Symptoms do not seem vaginal as she only notices when urinating.   UA today with 1+ leuks, trace blood, otherwise negative. Urine culture and urine microscopic pending.  Consider urology referral if warranted.  Await results.

## 2023-12-13 ENCOUNTER — Other Ambulatory Visit: Payer: Self-pay | Admitting: Primary Care

## 2023-12-13 ENCOUNTER — Ambulatory Visit: Payer: Self-pay | Admitting: Primary Care

## 2023-12-13 DIAGNOSIS — R310 Gross hematuria: Secondary | ICD-10-CM

## 2023-12-13 LAB — URINE CULTURE
MICRO NUMBER:: 16572768
Result:: NO GROWTH
SPECIMEN QUALITY:: ADEQUATE

## 2023-12-13 LAB — BASIC METABOLIC PANEL WITH GFR
BUN: 15 mg/dL (ref 7–25)
CO2: 24 mmol/L (ref 20–32)
Calcium: 10.1 mg/dL (ref 8.6–10.4)
Chloride: 104 mmol/L (ref 98–110)
Creat: 0.86 mg/dL (ref 0.50–1.05)
Glucose, Bld: 79 mg/dL (ref 65–99)
Potassium: 4.3 mmol/L (ref 3.5–5.3)
Sodium: 139 mmol/L (ref 135–146)
eGFR: 76 mL/min/{1.73_m2} (ref >=60)

## 2023-12-13 LAB — EXTRA URINE SPECIMEN

## 2023-12-16 ENCOUNTER — Other Ambulatory Visit

## 2023-12-16 DIAGNOSIS — R310 Gross hematuria: Secondary | ICD-10-CM

## 2023-12-17 ENCOUNTER — Ambulatory Visit: Payer: Self-pay | Admitting: Primary Care

## 2023-12-17 DIAGNOSIS — R310 Gross hematuria: Secondary | ICD-10-CM

## 2023-12-17 LAB — URINALYSIS, MICROSCOPIC ONLY: RBC / HPF: NONE SEEN /HPF (ref 0–2)

## 2023-12-19 ENCOUNTER — Ambulatory Visit
Admission: RE | Admit: 2023-12-19 | Discharge: 2023-12-19 | Disposition: A | Discharge status: Home / Self Care | Source: Ambulatory Visit | Attending: Primary Care | Admitting: Primary Care

## 2023-12-19 DIAGNOSIS — Z1231 Encounter for screening mammogram for malignant neoplasm of breast: Secondary | ICD-10-CM | POA: Insufficient documentation

## 2023-12-20 ENCOUNTER — Ambulatory Visit: Payer: Self-pay | Admitting: Primary Care

## 2023-12-29 ENCOUNTER — Other Ambulatory Visit: Payer: Self-pay

## 2023-12-29 ENCOUNTER — Encounter (HOSPITAL_COMMUNITY): Payer: Self-pay | Admitting: Emergency Medicine

## 2023-12-29 ENCOUNTER — Emergency Department (HOSPITAL_COMMUNITY)

## 2023-12-29 ENCOUNTER — Emergency Department (HOSPITAL_COMMUNITY)
Admission: EM | Admit: 2023-12-29 | Discharge: 2023-12-29 | Disposition: A | Discharge status: Home / Self Care | Attending: Emergency Medicine | Admitting: Emergency Medicine

## 2023-12-29 DIAGNOSIS — R109 Unspecified abdominal pain: Secondary | ICD-10-CM

## 2023-12-29 DIAGNOSIS — N12 Tubulo-interstitial nephritis, not specified as acute or chronic: Secondary | ICD-10-CM | POA: Insufficient documentation

## 2023-12-29 DIAGNOSIS — R1031 Right lower quadrant pain: Secondary | ICD-10-CM | POA: Diagnosis present

## 2023-12-29 DIAGNOSIS — Z91040 Latex allergy status: Secondary | ICD-10-CM | POA: Insufficient documentation

## 2023-12-29 DIAGNOSIS — R112 Nausea with vomiting, unspecified: Secondary | ICD-10-CM

## 2023-12-29 LAB — CBC
HCT: 40.1 % (ref 36.0–46.0)
Hemoglobin: 13.5 g/dL (ref 12.0–15.0)
MCH: 30.8 pg (ref 26.0–34.0)
MCHC: 33.7 g/dL (ref 30.0–36.0)
MCV: 91.3 fL (ref 80.0–100.0)
Platelets: 289 10*3/uL (ref 150–400)
RBC: 4.39 MIL/uL (ref 3.87–5.11)
RDW: 13.2 % (ref 11.5–15.5)
WBC: 6.6 10*3/uL (ref 4.0–10.5)
nRBC: 0 % (ref 0.0–0.2)

## 2023-12-29 LAB — URINALYSIS, ROUTINE W REFLEX MICROSCOPIC
Bilirubin Urine: NEGATIVE
Glucose, UA: NEGATIVE mg/dL
Ketones, ur: NEGATIVE mg/dL
Nitrite: NEGATIVE
Protein, ur: NEGATIVE mg/dL
Specific Gravity, Urine: 1.014 (ref 1.005–1.030)
WBC, UA: >50 WBC/hpf (ref 0–5)
pH: 5 (ref 5.0–8.0)

## 2023-12-29 LAB — BASIC METABOLIC PANEL WITH GFR
Anion gap: 8 (ref 5–15)
BUN: 15 mg/dL (ref 8–23)
CO2: 21 mmol/L — ABNORMAL LOW (ref 22–32)
Calcium: 9.3 mg/dL (ref 8.9–10.3)
Chloride: 110 mmol/L (ref 98–111)
Creatinine, Ser: 0.66 mg/dL (ref 0.44–1.00)
GFR, Estimated: >60 mL/min (ref >60)
Glucose, Bld: 110 mg/dL — ABNORMAL HIGH (ref 70–99)
Potassium: 4.6 mmol/L (ref 3.5–5.1)
Sodium: 139 mmol/L (ref 135–145)

## 2023-12-29 MED ORDER — CEFPODOXIME PROXETIL 200 MG PO TABS: 200.0000 mg | ORAL_TABLET | Freq: Two times a day (BID) | ORAL | 0 refills | Status: AC

## 2023-12-29 MED ORDER — OXYCODONE-ACETAMINOPHEN 5-325 MG PO TABS: 1.0000 | ORAL_TABLET | ORAL | 0 refills | Status: DC | PRN

## 2023-12-29 MED ORDER — FENTANYL CITRATE PF 50 MCG/ML IJ SOSY
50.0000 ug | PREFILLED_SYRINGE | Freq: Once | INTRAMUSCULAR | Status: AC
  Administered 2023-12-29: 50 ug via INTRAVENOUS
  Filled 2023-12-29: qty 1

## 2023-12-29 MED ORDER — FENTANYL CITRATE PF 50 MCG/ML IJ SOSY: 50.0000 ug | PREFILLED_SYRINGE | INTRAMUSCULAR | Status: DC | PRN

## 2023-12-29 MED ORDER — ONDANSETRON HCL 4 MG/2ML IJ SOLN
4.0000 mg | Freq: Once | INTRAMUSCULAR | Status: AC
  Administered 2023-12-29: 4 mg via INTRAVENOUS
  Filled 2023-12-29: qty 2

## 2023-12-29 MED ORDER — IOHEXOL 350 MG/ML SOLN
75.0000 mL | Freq: Once | INTRAVENOUS | Status: AC | PRN
  Administered 2023-12-29: 75 mL via INTRAVENOUS

## 2023-12-29 MED ORDER — SODIUM CHLORIDE 0.9 % IV SOLN
2.0000 g | Freq: Once | INTRAVENOUS | Status: AC
  Administered 2023-12-29: 2 g via INTRAVENOUS
  Filled 2023-12-29: qty 20

## 2023-12-29 MED ORDER — HYDROMORPHONE HCL 1 MG/ML IJ SOLN
1.0000 mg | Freq: Once | INTRAMUSCULAR | Status: AC
  Administered 2023-12-29: 1 mg via INTRAVENOUS
  Filled 2023-12-29: qty 1

## 2023-12-29 MED ORDER — HYDROMORPHONE HCL 1 MG/ML IJ SOLN
1.0000 mg | Freq: Once | INTRAMUSCULAR | Status: AC | PRN
  Administered 2023-12-29: 1 mg via INTRAVENOUS
  Filled 2023-12-29: qty 1

## 2023-12-29 MED ORDER — ACETAMINOPHEN 500 MG PO TABS
500.0000 mg | ORAL_TABLET | Freq: Once | ORAL | Status: AC
  Administered 2023-12-29: 500 mg via ORAL
  Filled 2023-12-29: qty 1

## 2023-12-29 MED ORDER — ONDANSETRON 4 MG PO TBDP: 4.0000 mg | ORAL_TABLET | Freq: Three times a day (TID) | ORAL | 0 refills | Status: DC | PRN

## 2023-12-29 MED ORDER — HYDROMORPHONE HCL 1 MG/ML IJ SOLN: 1.0000 mg | Freq: Once | INTRAMUSCULAR | Status: DC | PRN

## 2023-12-29 MED ORDER — OXYCODONE-ACETAMINOPHEN 5-325 MG PO TABS
1.0000 | ORAL_TABLET | Freq: Once | ORAL | Status: AC
  Administered 2023-12-29: 1 via ORAL
  Filled 2023-12-29: qty 1

## 2023-12-29 NOTE — ED Triage Notes (Signed)
 Patient arrives ambulatory by POV c/o right sided flank pain radiating into right lower abdomen and into groin. States she has had intermittent pain for a while. Began having hematuria about 2 weeks ago and is supposed to follow up with urology. Also having nausea.

## 2023-12-29 NOTE — ED Provider Notes (Signed)
 [place] EMERGENCY DEPARTMENT AT [facility] Provider Note   CSN: [id number] Arrival date & time: 12/29/23  9170     Patient presents with: Flank Pain   Elaine Young is a 63 y.o. female.  {Add pertinent medical, surgical, social history, OB history to HPI:32947} HPI     Prior to Admission medications   Medication Sig Start Date End Date Taking? Authorizing Provider  acetaminophen  (TYLENOL ) 500 MG tablet Take 500 mg by mouth every 6 (six) hours as needed (for pain.).    [provider]  atorvastatin  (LIPITOR) 40 MG tablet Take 1 tablet (40 mg total) by mouth daily. for cholesterol. 11/11/23   Clark, Katherine K, NP  Blood Glucose Monitoring Suppl DEVI 1 each by Does not apply route in the morning, at noon, and at bedtime. May substitute to any manufacturer covered by patient's insurance. 12/12/23   Gretta Comer POUR, NP  ezetimibe  (ZETIA ) 10 MG tablet Take 1 tablet (10 mg total) by mouth daily. 11/07/23 02/05/24  Clark, Katherine K, NP  Glucose Blood (BLOOD GLUCOSE TEST STRIPS) STRP 1 each by In Vitro route in the morning, at noon, and at bedtime. May substitute to any manufacturer covered by patient's insurance. 12/12/23   Clark, Katherine K, NP  ibuprofen (ADVIL) 200 MG tablet Take 800 mg by mouth every 6 (six) hours as needed for headache or moderate pain.    [provider]  Lancet Device MISC 1 each by Does not apply route in the morning, at noon, and at bedtime. May substitute to any manufacturer covered by patient's insurance. 12/12/23 01/11/24  Gretta Comer POUR, NP  Lancets Misc. MISC 1 each by Does not apply route in the morning, at noon, and at bedtime. May substitute to any manufacturer covered by patient's insurance. 12/12/23   Clark, Katherine K, NP  metoprolol  succinate (TOPROL  XL) 25 MG 24 hr tablet Take 1 tablet (25 mg total) by mouth daily. 11/07/22   Nahser, Aleene PARAS, MD  Semaglutide ,0.25 or 0.5MG /DOS, (OZEMPIC , 0.25 OR 0.5 MG/DOSE,) 2 MG/3ML  SOPN Inject 0.25 mg into the skin once weekly for 4 weeks, then increase to 0.5 mg once weekly thereafter for diabetes. 11/11/23   Gretta Comer POUR, NP  valsartan  (DIOVAN ) 80 MG tablet Take 1 tablet (80 mg total) by mouth daily. for blood pressure. 11/29/23   Clark, Katherine K, NP  venlafaxine  XR (EFFEXOR -XR) 150 MG 24 hr capsule TAKE 1 CAPSULE BY MOUTH DAILY  WITH BREAKFAST FOR ANXIETY AND  DEPRESSION. TAKE WITH  VENLAFAXINE  XR 37.5MG . 11/12/23   Clark, Katherine K, NP  venlafaxine  XR (EFFEXOR -XR) 37.5 MG 24 hr capsule TAKE 1 CAPSULE BY MOUTH DAILY  WITH BREAKFAST FOR DEPRESSION  AND ANXIETY. TAKE WITH 150 MG 12/12/22   Gretta Comer POUR, NP    Allergies: Codeine, Latex, Lipitor [atorvastatin ], Shellfish allergy, and Sulfa antibiotics    Review of Systems  Updated Vital Signs BP (!) 150/75   Pulse 75   Temp 97.7 F (36.5 C) (Oral)   Resp 15   Ht 5' 8 (1.727 m)   Wt 90.7 kg   SpO2 93%   BMI 30.41 kg/m   Physical Exam  (all labs ordered are listed, but only abnormal results are displayed) Labs Reviewed  URINALYSIS, ROUTINE W REFLEX MICROSCOPIC - Abnormal; Notable for the following components:      Result Value   APPearance CLOUDY (*)    Hgb urine dipstick SMALL (*)    Leukocytes,Ua LARGE (*)  Bacteria, UA FEW (*)    Non Squamous Epithelial 0-5 (*)    All other components within normal limits  BASIC METABOLIC PANEL WITH GFR - Abnormal; Notable for the following components:   CO2 21 (*)    Glucose, Bld 110 (*)    All other components within normal limits  URINE CULTURE  CBC    EKG: None  Radiology: CT Renal Stone Study Result Date: 12/29/2023 CLINICAL DATA:  Abdominal/flank pain, stone suspected EXAM: CT ABDOMEN AND PELVIS WITHOUT CONTRAST TECHNIQUE: Multidetector CT imaging of the abdomen and pelvis was performed following the standard protocol without IV contrast. RADIATION DOSE REDUCTION: This exam was performed according to the departmental dose-optimization program  which includes automated exposure control, adjustment of the mA and/or kV according to patient size and/or use of iterative reconstruction technique. COMPARISON:  CT abdomen/pelvis dated 08/21/2022. FINDINGS: Lower chest: Dependent changes at the bilateral posterior lower lobes. Hepatobiliary: No focal liver abnormality is seen. Status post cholecystectomy. No biliary dilatation. Pancreas: Unremarkable. No pancreatic ductal dilatation or surrounding inflammatory changes. Spleen: No splenic injury or perisplenic hematoma. Adrenals/Urinary Tract: Adrenal glands are unremarkable. Grossly unchanged right renal cyst. No urolithiasis or hydronephrosis. Bladder is unremarkable. Stomach/Bowel: Stomach is within normal limits. Suspected gas containing appendix appears closely apposed to/abutting a loop of small bowel is otherwise within normal limits (series 7, image 69). No evidence of obstruction or focal inflammatory changes. Colonic diverticulosis, most pronounced in the sigmoid colon, without evidence of acute diverticulitis. Moderate volume of stool throughout the colon. Vascular/Lymphatic: Abdominal aorta is normal in caliber with atherosclerotic calcification. No enlarged abdominal or pelvic lymph nodes. Reproductive: Status post hysterectomy. No adnexal masses. Other: No abdominopelvic ascites. No intraperitoneal free air. No abdominal wall hernia. Musculoskeletal: Prior L5-S1 anterior interbody fusion. No acute osseous abnormality. No suspicious osseous lesion. IMPRESSION: 1. No acute localizing findings in the abdomen or pelvis. 2. No urolithiasis or hydronephrosis. 3. Colonic diverticulosis, most pronounced in the sigmoid colon, without evidence of acute diverticulitis. 4.  Aortic Atherosclerosis (ICD10-I70.0). Electronically Signed   By: Harrietta Sherry M.D.   On: 12/29/2023 12:50    {Document cardiac monitor, telemetry assessment procedure when appropriate:32947} Procedures   Medications Ordered in the  ED  cefTRIAXone  (ROCEPHIN ) 2 g in sodium chloride  0.9 % 100 mL IVPB (has no administration in time range)  ondansetron  (ZOFRAN ) injection 4 mg (4 mg Intravenous Given 12/29/23 1238)  fentaNYL  (SUBLIMAZE ) injection 50 mcg (50 mcg Intravenous Given 12/29/23 1238)  oxyCODONE -acetaminophen  (PERCOCET/ROXICET) 5-325 MG per tablet 1 tablet (1 tablet Oral Given 12/29/23 1310)  HYDROmorphone  (DILAUDID ) injection 1 mg (1 mg Intravenous Given 12/29/23 1416)      {Click here for ABCD2, HEART and other calculators REFRESH Note before signing:1}                              Medical Decision Making Amount and/or Complexity of Data Reviewed Labs: ordered. Radiology: ordered.  Risk Prescription drug management.   63 year old patient comes in with chief complaint of right-sided flank pain radiating down her groin.  Patient has history of kidney stones, cholecystectomy.  Patient's symptoms have been present now for several days, but the pain has worsened significantly in the last 3 days.   On initial evaluation, patient noted to have lower quadrant tenderness, with positive McBurney's.  She also has flank tenderness.  Differential diagnosis includes kidney stone, pyelonephritis, acute appendicitis, perforated viscus. she is status post hysterectomy and cholecystectomy.  Low suspicion for ovarian cyst or torsion.  Low suspicion for PID.  Initial plan is to get CT renal stone. Basic labs also ordered.   Reassessment: Patient's UA is positive for large leukocytes, hematuria and pyuria.  However CT scan, which was independently interpreted does not show any perforation or hydronephrosis.  Per urologist there is no evidence of kidney stone.  I reassessed the patient.  She continues to have significant discomfort.  Fentanyl  did not help her.  On repeat abdominal exam, she has positive McBurney's, persistent suprapubic tenderness with guarding and right flank tenderness.  I discussed the case with the  radiologist.  They cannot definitively say that patient does not have appendicitis based on the CT scan that was done.  Their CT scan does not show any concerning findings in the right lower quadrant.  They recommend that if there is persistent clinical concerns I will get CT abdomen pelvis with oral and IV contrast.  That is the current plan.  If the CT is negative, then patient will need to be treated for pyelonephritis.  4:34 PM Patient has severe pain. Giving her 1 round of IV Dilaudid .  CT pending at this time.  Clinically, patient has pyelonephritis if the CT scan is negative.  Final diagnoses:  None    ED Discharge Orders     None

## 2023-12-29 NOTE — Discharge Instructions (Addendum)
 Your history, exam, workup today are consistent with pyelonephritis or urinary tract infection that is causing symptoms in the kidney.  The CT scan did not show obstructing kidney stones and the second CT scan did not show appendicitis.  Your labs are otherwise reassuring as we discussed and we feel you are now safe for discharge home.  Please use the pain medicine, nausea, and antibiotic to treat symptoms and follow-up with your primary doctor.  If any symptoms change or worsen acutely, please return to the nearest emergency department.

## 2023-12-29 NOTE — ED Provider Notes (Signed)
 Care assumed from Dr. Charlyn.  Time of transfer of care, patient is waiting for repeat CT abdomen pelvis with both oral and IV contrast to get a better look at the appendix as the previous team is clinically concerned about appendicitis now that it does not appear there is a large kidney stone.  Patient does have evidence of urinary tract infection so if the CT is negative, suspect pyelonephritis is primary symptoms.  Patient is receiving antibiotics and if workup is otherwise reassuring she may be candidate for discharge home.  7:12 PM Repeat CT scan did not show appendicitis.  Suspect pyelonephritis causing all of her symptoms.  Patient needed more pain medicine and nausea medicine but she would like to try going home.  We offered admission for given the severity of her symptoms.  Patient will get prescription pain medicine, nausea rest, and antibiotics.  She understood return precautions and follow-up instructions and was discharged in good condition.   Clinical Impression: 1. Flank pain   2. Pyelonephritis   3. Nausea and vomiting, unspecified vomiting type     Disposition: Discharge  Condition: Good  I have discussed the results, Dx and Tx plan with the pt(& family if present). He/she/they expressed understanding and agree(s) with the plan. Discharge instructions discussed at great length. Strict return precautions discussed and pt &/or family have verbalized understanding of the instructions. No further questions at time of discharge.    New Prescriptions   CEFPODOXIME (VANTIN) 200 MG TABLET    Take 1 tablet (200 mg total) by mouth 2 (two) times daily for 14 days.   ONDANSETRON  (ZOFRAN -ODT) 4 MG DISINTEGRATING TABLET    Take 1 tablet (4 mg total) by mouth every 8 (eight) hours as needed for nausea or vomiting.   OXYCODONE -ACETAMINOPHEN  (PERCOCET/ROXICET) 5-325 MG TABLET    Take 1 tablet by mouth every 4 (four) hours as needed for severe pain (pain score 7-10).    Follow Up: Gretta Comer POUR, NP [address] [place] [address] [phone]     [facility] [address] [place]  [address] [phone]       Sanvi Ehler, Lonni PARAS, MD 12/29/23 [phone]

## 2023-12-29 NOTE — ED Notes (Signed)
 This RN called CT for reminder of patient scan of abdomen and pelvis as patient had to drink oral contrast.  Pt to be scanned from 1645-1715 today 12/29/2023.

## 2023-12-31 ENCOUNTER — Ambulatory Visit: Admitting: Primary Care

## 2023-12-31 ENCOUNTER — Encounter: Payer: Self-pay | Admitting: Primary Care

## 2023-12-31 VITALS — BP 138/88 | HR 87 | Temp 97.2°F | Ht 68.0 in | Wt 200.0 lb

## 2023-12-31 DIAGNOSIS — N10 Acute tubulo-interstitial nephritis: Secondary | ICD-10-CM | POA: Diagnosis not present

## 2023-12-31 DIAGNOSIS — N898 Other specified noninflammatory disorders of vagina: Secondary | ICD-10-CM

## 2023-12-31 LAB — URINE CULTURE

## 2023-12-31 MED ORDER — FLUCONAZOLE 150 MG PO TABS: 150.0000 mg | ORAL_TABLET | Freq: Once | ORAL | 0 refills | Status: AC

## 2023-12-31 NOTE — Assessment & Plan Note (Addendum)
 With recent ED visit. ED notes, labs, imaging reviewed.  Exam today seems improved  Continue cefpodoxime twice daily x 14 days until complete. Continue Zofran  as needed.  She will update if symptoms do not resolve and or begin to progress.  Prescription for fluoxetine provided for potential antibiotic induced vaginitis.

## 2023-12-31 NOTE — Progress Notes (Signed)
 Subjective:    Patient ID: Elaine Young, female    DOB: 25-Aug-1960, 63 y.o.   MRN: [id number]  HPI  Elaine Young is a very pleasant 63 y.o. female with a history of hypertension, type 2 diabetes, anxiety depression, chronic back pain who presents today for ED follow-up.  She presented to [facility] ED on 12/29/2023 for flank pain, nausea and vomiting.  She underwent CT renal stone which was without acute abnormality.  She then underwent CT abdomen pelvis with contrast Which was also negative for acute abnormality.  Based on symptoms and urinalysis she was suspected to have pyelonephritis.  Admission was recommended but she kindly declined.  She was discharged home later that day with a prescription for cefpodoxime 200 mg BID x 14 days, Zofran  as needed, Percocet as needed.  Since her ED visit she's feeling much better. She does notice her right sided flank pain and RLQ abdominal pain but pain is much improved. She is compliant to her cefpodoxaime as prescribed. She is using Zofran  as needed. She is hydrating well with water.   She has developed vaginal itching, has a history of antibiotic induced vaginitis.   Review of Systems  Constitutional:  Negative for fever.  Gastrointestinal:  Positive for abdominal pain.  Genitourinary:  Positive for flank pain. Negative for dysuria, hematuria and vaginal discharge.       Vaginal itching         Past Medical History:  Diagnosis Date   Acne vulgaris 09/12/2021   Carpal tunnel syndrome    Cervical disc disorder    Complication of anesthesia    Essential hypertension    Functional incontinence    Generalized anxiety disorder    Hyperlipidemia    Intractable vomiting with nausea 08/22/2022   Lower extremity numbness    Lumbar disc disease    Migraines    Norovirus 08/21/2022   PONV (postoperative nausea and vomiting)    per pt, she usually gets zofran  and scopalamine patch    Post-viral cough syndrome 08/02/2021   Renal  disorder    kidney stone   S/P lumbar fusion 11/16/2019   Seasonal allergies    Vitamin D deficiency     Social History   Socioeconomic History   Marital status: Married    Spouse name: Not on file   Number of children: Not on file   Years of education: Not on file   Highest education level: Associate degree: occupational, Scientist, product/process development, or vocational program  Occupational History   Not on file  Tobacco Use   Smoking status: Never   Smokeless tobacco: Never  Vaping Use   Vaping status: Never Used  Substance and Sexual Activity   Alcohol use: Yes    Alcohol/week: 0.0 standard drinks of alcohol   Drug use: No   Sexual activity: Not on file  Other Topics Concern   Not on file  Social History Narrative   Married.   1 child. 1 grandchild.   Retired.    Enjoys reading.    Social Drivers of Corporate investment banker Strain: Low Risk  (11/06/2023)   Overall Financial Resource Strain (CARDIA)    Difficulty of Paying Living Expenses: Not hard at all  Food Insecurity: No Food Insecurity (11/06/2023)   Hunger Vital Sign    Worried About Running Out of Food in the Last Year: Never true    Ran Out of Food in the Last Year: Never true  Transportation Needs: No Transportation Needs (11/06/2023)  PRAPARE - Administrator, Civil Service (Medical): No    Lack of Transportation (Non-Medical): No  Physical Activity: Insufficiently Active (11/06/2023)   Exercise Vital Sign    Days of Exercise per Week: 3 days    Minutes of Exercise per Session: 30 min  Stress: Stress Concern Present (11/06/2023)   [name] of Occupational Health - Occupational Stress Questionnaire    Feeling of Stress : To some extent  Social Connections: Moderately Integrated (11/06/2023)   Social Connection and Isolation Panel    Frequency of Communication with Friends and Family: More than three times a week    Frequency of Social Gatherings with Friends and Family: Three times a week    Attends  Religious Services: More than 4 times per year    Active Member of Clubs or Organizations: No    Attends Banker Meetings: Not on file    Marital Status: Married  Intimate Partner Violence: Not At Risk (08/21/2022)   Humiliation, Afraid, Rape, and Kick questionnaire    Fear of Current or Ex-Partner: No    Emotionally Abused: No    Physically Abused: No    Sexually Abused: No    Past Surgical History:  Procedure Laterality Date   ABDOMINAL EXPOSURE N/A 11/16/2019   Procedure: ABDOMINAL EXPOSURE;  Surgeon: Gretta Lonni PARAS, MD;  Location: [facility] OR;  Service: Vascular;  Laterality: N/A;   ABDOMINAL HYSTERECTOMY     ANTERIOR LUMBAR FUSION N/A 11/16/2019   Procedure: LUMBAR FIVE-SACRAL ONE ANTERIOR LUMBAR INTERBODY FUSION;  Surgeon: Joshua Alm RAMAN, MD;  Location: [facility] OR;  Service: Neurosurgery;  Laterality: N/A;  anterior approach   BLADDER SUSPENSION  2005   BREAST BIOPSY Bilateral yrs ago in [place]   high risk lesions, not cancer per pt   BREAST CYST EXCISION Bilateral yrs ago in [place]   hard to see some excision areas   CHOLECYSTECTOMY     Neck fusion  12/28/2022   TONSILLECTOMY      Family History  Problem Relation Age of Onset   Hyperlipidemia Mother    Breast cancer Mother 76   Ovarian cancer Maternal Aunt    Lung cancer Maternal Uncle    Arthritis Maternal Grandmother    Arthritis Maternal Grandfather    Colon cancer Maternal Grandfather    Arthritis Paternal Grandmother    Hypertension Paternal Grandmother    Diabetes Paternal Grandmother    Arthritis Paternal Grandfather    Diabetes Paternal Grandfather    Arthritis Cousin    Breast cancer Cousin        maternal side x2    Allergies  Allergen Reactions   Codeine Nausea And Vomiting   Latex Rash   Lipitor [Atorvastatin ] Rash   Shellfish Allergy Swelling and Rash   Sulfa Antibiotics Rash    Current Outpatient Medications on File Prior to Visit  Medication Sig Dispense Refill   acetaminophen  (TYLENOL )  500 MG tablet Take 1,000 mg by mouth daily as needed for mild pain (pain score 1-3) or moderate pain (pain score 4-6).     atorvastatin  (LIPITOR) 40 MG tablet Take 1 tablet (40 mg total) by mouth daily. for cholesterol. 90 tablet 0   cefpodoxime (VANTIN) 200 MG tablet Take 1 tablet (200 mg total) by mouth 2 (two) times daily for 14 days. 28 tablet 0   ezetimibe  (ZETIA ) 10 MG tablet Take 1 tablet (10 mg total) by mouth daily. 90 tablet 3   Glucose Blood (BLOOD GLUCOSE TEST STRIPS) STRP  1 each by In Vitro route in the morning, at noon, and at bedtime. May substitute to any manufacturer covered by patient's insurance. 100 strip 5   Lancets Misc. MISC 1 each by Does not apply route in the morning, at noon, and at bedtime. May substitute to any manufacturer covered by patient's insurance. 100 each 5   methocarbamol  (ROBAXIN ) 500 MG tablet Take 500 mg by mouth at bedtime as needed for muscle spasms.     metoprolol  succinate (TOPROL  XL) 25 MG 24 hr tablet Take 1 tablet (25 mg total) by mouth daily. 90 tablet 3   ondansetron  (ZOFRAN -ODT) 4 MG disintegrating tablet Take 1 tablet (4 mg total) by mouth every 8 (eight) hours as needed for nausea or vomiting. 20 tablet 0   Semaglutide  (OZEMPIC , 1 MG/DOSE, [place]) Inject 1 mg into the skin once a week.     Semaglutide ,0.25 or 0.5MG /DOS, (OZEMPIC , 0.25 OR 0.5 MG/DOSE,) 2 MG/3ML SOPN Inject 0.25 mg into the skin once weekly for 4 weeks, then increase to 0.5 mg once weekly thereafter for diabetes. 9 mL 0   valsartan  (DIOVAN ) 80 MG tablet Take 1 tablet (80 mg total) by mouth daily. for blood pressure. 30 tablet 0   venlafaxine  XR (EFFEXOR -XR) 150 MG 24 hr capsule TAKE 1 CAPSULE BY MOUTH DAILY  WITH BREAKFAST FOR ANXIETY AND  DEPRESSION. TAKE WITH  VENLAFAXINE  XR 37.5MG . 90 capsule 3   venlafaxine  XR (EFFEXOR -XR) 37.5 MG 24 hr capsule TAKE 1 CAPSULE BY MOUTH DAILY  WITH BREAKFAST FOR DEPRESSION  AND ANXIETY. TAKE WITH 150 MG 90 capsule 2   oxyCODONE -acetaminophen   (PERCOCET/ROXICET) 5-325 MG tablet Take 1 tablet by mouth every 4 (four) hours as needed for severe pain (pain score 7-10). (Patient not taking: Reported on 12/31/2023) 15 tablet 0   No current facility-administered medications on file prior to visit.    BP 138/88   Pulse 87   Temp (!) 97.2 F (36.2 C) (Temporal)   Ht 5' 8 (1.727 m)   Wt 200 lb (90.7 kg)   SpO2 95%   BMI 30.41 kg/m  Objective:   Physical Exam  Cardiovascular:     Rate and Rhythm: Normal rate and regular rhythm.  Pulmonary:     Effort: Pulmonary effort is normal.  Abdominal:     Palpations: Abdomen is soft.     Tenderness: There is abdominal tenderness in the right upper quadrant, right lower quadrant and suprapubic area. There is right CVA tenderness. There is no left CVA tenderness.           Assessment & Plan:  Vaginal itching -     Fluconazole ; Take 1 tablet (150 mg total) by mouth once for 1 dose.  Dispense: 1 tablet; Refill: 0  Acute pyelonephritis Assessment & Plan: With recent ED visit. ED notes, labs, imaging reviewed.  Exam today seems improved  Continue cefpodoxime twice daily x 14 days until complete. Continue Zofran  as needed.  She will update if symptoms do not resolve and or begin to progress.  Prescription for fluoxetine provided for potential antibiotic induced vaginitis.         Lyndon Chenoweth K Adeleigh Barletta, NP

## 2024-01-07 ENCOUNTER — Other Ambulatory Visit: Payer: Self-pay

## 2024-01-07 DIAGNOSIS — I10 Essential (primary) hypertension: Secondary | ICD-10-CM

## 2024-01-07 MED ORDER — VALSARTAN 80 MG PO TABS: 80.0000 mg | ORAL_TABLET | Freq: Every day | ORAL | 2 refills | Status: AC

## 2024-01-09 ENCOUNTER — Ambulatory Visit: Payer: Self-pay | Admitting: Primary Care

## 2024-01-09 ENCOUNTER — Other Ambulatory Visit: Payer: Self-pay | Admitting: Primary Care

## 2024-01-09 ENCOUNTER — Encounter: Payer: Self-pay | Admitting: Primary Care

## 2024-01-09 ENCOUNTER — Ambulatory Visit: Admitting: Primary Care

## 2024-01-09 ENCOUNTER — Ambulatory Visit
Admission: RE | Admit: 2024-01-09 | Discharge: 2024-01-09 | Disposition: A | Discharge status: Home / Self Care | Source: Ambulatory Visit | Attending: Primary Care | Admitting: Primary Care

## 2024-01-09 VITALS — BP 142/68 | HR 78 | Temp 97.2°F | Resp 97 | Ht 68.0 in | Wt 202.0 lb

## 2024-01-09 DIAGNOSIS — R1031 Right lower quadrant pain: Secondary | ICD-10-CM | POA: Insufficient documentation

## 2024-01-09 DIAGNOSIS — R310 Gross hematuria: Secondary | ICD-10-CM

## 2024-01-09 DIAGNOSIS — R109 Unspecified abdominal pain: Secondary | ICD-10-CM

## 2024-01-09 DIAGNOSIS — K5792 Diverticulitis of intestine, part unspecified, without perforation or abscess without bleeding: Secondary | ICD-10-CM

## 2024-01-09 LAB — POC URINALSYSI DIPSTICK (AUTOMATED)
Bilirubin, UA: NEGATIVE
Blood, UA: POSITIVE
Glucose, UA: NEGATIVE
Ketones, UA: NEGATIVE
Nitrite, UA: NEGATIVE
Protein, UA: NEGATIVE
Spec Grav, UA: 1.015 (ref 1.010–1.025)
Urobilinogen, UA: 0.2 U/dL
pH, UA: 6 (ref 5.0–8.0)

## 2024-01-09 LAB — CBC WITH DIFFERENTIAL/PLATELET
Basophils Absolute: 0 K/uL (ref 0.0–0.1)
Basophils Relative: 0.1 % (ref 0.0–3.0)
Eosinophils Absolute: 0.2 K/uL (ref 0.0–0.7)
Eosinophils Relative: 2.5 % (ref 0.0–5.0)
HCT: 39.9 % (ref 36.0–46.0)
Hemoglobin: 13.4 g/dL (ref 12.0–15.0)
Lymphocytes Relative: 26.7 % (ref 12.0–46.0)
Lymphs Abs: 1.8 K/uL (ref 0.7–4.0)
MCHC: 33.7 g/dL (ref 30.0–36.0)
MCV: 90.7 fl (ref 78.0–100.0)
Monocytes Absolute: 0.5 K/uL (ref 0.1–1.0)
Monocytes Relative: 8 % (ref 3.0–12.0)
Neutro Abs: 4.2 K/uL (ref 1.4–7.7)
Neutrophils Relative %: 62.7 % (ref 43.0–77.0)
Platelets: 299 K/uL (ref 150.0–400.0)
RBC: 4.39 Mil/uL (ref 3.87–5.11)
RDW: 13.6 % (ref 11.5–15.5)
WBC: 6.6 K/uL (ref 4.0–10.5)

## 2024-01-09 LAB — BASIC METABOLIC PANEL WITH GFR
BUN: 12 mg/dL (ref 6–23)
CO2: 28 meq/L (ref 19–32)
Calcium: 9.5 mg/dL (ref 8.4–10.5)
Chloride: 103 meq/L (ref 96–112)
Creatinine, Ser: 0.74 mg/dL (ref 0.40–1.20)
GFR: 85.78 mL/min (ref >60.00)
Glucose, Bld: 93 mg/dL (ref 70–99)
Potassium: 4.4 meq/L (ref 3.5–5.1)
Sodium: 137 meq/L (ref 135–145)

## 2024-01-09 MED ORDER — METRONIDAZOLE 500 MG PO TABS: 500.0000 mg | ORAL_TABLET | Freq: Three times a day (TID) | ORAL | 0 refills | Status: AC

## 2024-01-09 MED ORDER — CIPROFLOXACIN HCL 500 MG PO TABS: 500.0000 mg | ORAL_TABLET | Freq: Two times a day (BID) | ORAL | 0 refills | Status: AC

## 2024-01-09 MED ORDER — IOHEXOL 300 MG/ML  SOLN
100.0000 mL | Freq: Once | INTRAMUSCULAR | Status: AC | PRN
  Administered 2024-01-09: 100 mL via INTRAVENOUS

## 2024-01-09 NOTE — Progress Notes (Signed)
 Subjective:    Patient ID: Elaine Young, female    DOB: September 01, 1960, 63 y.o.   MRN: [id number]  Back Pain Pertinent negatives include no abdominal pain, dysuria, fever or pelvic pain.    Elaine Young is a very pleasant 63 y.o. female with a history of hypertension, type 2 diabetes, gross hematuria, chronic low back pain, acute pyelonephritis who presents today to discuss groin pain.  She was last evaluated on 12/31/2023 for ED follow-up for acute pyelonephritis.  During this visit symptoms had improved and she was compliant to her antibiotics as prescribed. She contacted us  via MyChart 3 days ago with reports of right groin and flank pain with intermittent nausea and hematuria.  She is here for reevaluation today.  Today she discusses right flank pain, right groin pain. Her pain is worse when she's active with walking or moving. Her pain also wakes her during the night. She continues to notice intermittent hematuria during urination and on her pad. She's compliant to her antibiotics twice daily. Also with nausea.   She denies fevers, vomiting, abdominal pain, bowel changes, difficulty urinating. She's taken Percocet once for her pain with improvement temporarily.  BP Readings from Last 3 Encounters:  01/09/24 (!) 142/68  12/31/23 138/88  12/29/23 135/72      Review of Systems  Constitutional:  Negative for fever.  Gastrointestinal:  Positive for nausea. Negative for abdominal pain, constipation, diarrhea and vomiting.  Genitourinary:  Positive for flank pain and hematuria. Negative for dysuria, frequency, pelvic pain, vaginal bleeding and vaginal discharge.  Musculoskeletal:  Positive for back pain.         Past Medical History:  Diagnosis Date   Acne vulgaris 09/12/2021   Carpal tunnel syndrome    Cervical disc disorder    Complication of anesthesia    Essential hypertension    Functional incontinence    Generalized anxiety disorder    Hyperlipidemia     Intractable vomiting with nausea 08/22/2022   Lower extremity numbness    Lumbar disc disease    Migraines    Norovirus 08/21/2022   PONV (postoperative nausea and vomiting)    per pt, she usually gets zofran  and scopalamine patch    Post-viral cough syndrome 08/02/2021   Renal disorder    kidney stone   S/P lumbar fusion 11/16/2019   Seasonal allergies    Vitamin D deficiency     Social History   Socioeconomic History   Marital status: Married    Spouse name: Not on file   Number of children: Not on file   Years of education: Not on file   Highest education level: Associate degree: occupational, Scientist, product/process development, or vocational program  Occupational History   Not on file  Tobacco Use   Smoking status: Never   Smokeless tobacco: Never  Vaping Use   Vaping status: Never Used  Substance and Sexual Activity   Alcohol use: Yes    Alcohol/week: 0.0 standard drinks of alcohol   Drug use: No   Sexual activity: Not on file  Other Topics Concern   Not on file  Social History Narrative   Married.   1 child. 1 grandchild.   Retired.    Enjoys reading.    Social Drivers of Corporate investment banker Strain: Low Risk  (11/06/2023)   Overall Financial Resource Strain (CARDIA)    Difficulty of Paying Living Expenses: Not hard at all  Food Insecurity: No Food Insecurity (11/06/2023)   Hunger Vital Sign  Worried About Programme researcher, broadcasting/film/video in the Last Year: Never true    Ran Out of Food in the Last Year: Never true  Transportation Needs: No Transportation Needs (11/06/2023)   PRAPARE - Administrator, Civil Service (Medical): No    Lack of Transportation (Non-Medical): No  Physical Activity: Insufficiently Active (11/06/2023)   Exercise Vital Sign    Days of Exercise per Week: 3 days    Minutes of Exercise per Session: 30 min  Stress: Stress Concern Present (11/06/2023)   [name] of Occupational Health - Occupational Stress Questionnaire    Feeling of Stress : To some  extent  Social Connections: Moderately Integrated (11/06/2023)   Social Connection and Isolation Panel    Frequency of Communication with Friends and Family: More than three times a week    Frequency of Social Gatherings with Friends and Family: Three times a week    Attends Religious Services: More than 4 times per year    Active Member of Clubs or Organizations: No    Attends Banker Meetings: Not on file    Marital Status: Married  Intimate Partner Violence: Not At Risk (08/21/2022)   Humiliation, Afraid, Rape, and Kick questionnaire    Fear of Current or Ex-Partner: No    Emotionally Abused: No    Physically Abused: No    Sexually Abused: No    Past Surgical History:  Procedure Laterality Date   ABDOMINAL EXPOSURE N/A 11/16/2019   Procedure: ABDOMINAL EXPOSURE;  Surgeon: Gretta Lonni PARAS, MD;  Location: [facility] OR;  Service: Vascular;  Laterality: N/A;   ABDOMINAL HYSTERECTOMY     ANTERIOR LUMBAR FUSION N/A 11/16/2019   Procedure: LUMBAR FIVE-SACRAL ONE ANTERIOR LUMBAR INTERBODY FUSION;  Surgeon: Joshua Alm RAMAN, MD;  Location[facility];  Service: Neurosurgery;  Laterality: N/A;  anterior approach   BLADDER SUSPENSION  2005   BREAST BIOPSY Bilateral yrs ago in [place]   high risk lesions, not cancer per pt   BREAST CYST EXCISION Bilateral yrs ago in [place]   hard to see some excision areas   CHOLECYSTECTOMY     Neck fusion  12/28/2022   TONSILLECTOMY      Family History  Problem Relation Age of Onset   Hyperlipidemia Mother    Breast cancer Mother 45   Ovarian cancer Maternal Aunt    Lung cancer Maternal Uncle    Arthritis Maternal Grandmother    Arthritis Maternal Grandfather    Colon cancer Maternal Grandfather    Arthritis Paternal Grandmother    Hypertension Paternal Grandmother    Diabetes Paternal Grandmother    Arthritis Paternal Grandfather    Diabetes Paternal Grandfather    Arthritis Cousin    Breast cancer Cousin        maternal side x2    Allergies   Allergen Reactions   Codeine Nausea And Vomiting   Latex Rash   Lipitor [Atorvastatin ] Rash   Shellfish Allergy Swelling and Rash   Sulfa Antibiotics Rash    Current Outpatient Medications on File Prior to Visit  Medication Sig Dispense Refill   acetaminophen  (TYLENOL ) 500 MG tablet Take 1,000 mg by mouth daily as needed for mild pain (pain score 1-3) or moderate pain (pain score 4-6).     atorvastatin  (LIPITOR) 40 MG tablet Take 1 tablet (40 mg total) by mouth daily. for cholesterol. 90 tablet 0   cefpodoxime  (VANTIN ) 200 MG tablet Take 1 tablet (200 mg total) by mouth 2 (two) times daily  for 14 days. 28 tablet 0   ezetimibe  (ZETIA ) 10 MG tablet Take 1 tablet (10 mg total) by mouth daily. 90 tablet 3   Glucose Blood (BLOOD GLUCOSE TEST STRIPS) STRP 1 each by In Vitro route in the morning, at noon, and at bedtime. May substitute to any manufacturer covered by patient's insurance. 100 strip 5   Lancets Misc. MISC 1 each by Does not apply route in the morning, at noon, and at bedtime. May substitute to any manufacturer covered by patient's insurance. 100 each 5   methocarbamol  (ROBAXIN ) 500 MG tablet Take 500 mg by mouth at bedtime as needed for muscle spasms.     metoprolol  succinate (TOPROL  XL) 25 MG 24 hr tablet Take 1 tablet (25 mg total) by mouth daily. 90 tablet 3   ondansetron  (ZOFRAN -ODT) 4 MG disintegrating tablet Take 1 tablet (4 mg total) by mouth every 8 (eight) hours as needed for nausea or vomiting. 20 tablet 0   Semaglutide  (OZEMPIC , 1 MG/DOSE, [place]) Inject 1 mg into the skin once a week.     Semaglutide ,0.25 or 0.5MG /DOS, (OZEMPIC , 0.25 OR 0.5 MG/DOSE,) 2 MG/3ML SOPN Inject 0.25 mg into the skin once weekly for 4 weeks, then increase to 0.5 mg once weekly thereafter for diabetes. 9 mL 0   valsartan  (DIOVAN ) 80 MG tablet Take 1 tablet (80 mg total) by mouth daily. for blood pressure. 90 tablet 2   venlafaxine  XR (EFFEXOR -XR) 150 MG 24 hr capsule TAKE 1 CAPSULE BY MOUTH DAILY  WITH  BREAKFAST FOR ANXIETY AND  DEPRESSION. TAKE WITH  VENLAFAXINE  XR 37.5MG . 90 capsule 3   venlafaxine  XR (EFFEXOR -XR) 37.5 MG 24 hr capsule TAKE 1 CAPSULE BY MOUTH DAILY  WITH BREAKFAST FOR DEPRESSION  AND ANXIETY. TAKE WITH 150 MG 90 capsule 2   oxyCODONE -acetaminophen  (PERCOCET/ROXICET) 5-325 MG tablet Take 1 tablet by mouth every 4 (four) hours as needed for severe pain (pain score 7-10). (Patient not taking: Reported on 01/09/2024) 15 tablet 0   No current facility-administered medications on file prior to visit.    BP (!) 142/68   Pulse 78   Temp (!) 97.2 F (36.2 C) (Temporal)   Resp (!) 97   Ht 5' 8 (1.727 m)   Wt 202 lb (91.6 kg)   BMI 30.71 kg/m  Objective:   Physical Exam Cardiovascular:     Rate and Rhythm: Normal rate and regular rhythm.  Pulmonary:     Effort: Pulmonary effort is normal.     Breath sounds: Normal breath sounds.  Abdominal:     Tenderness: There is abdominal tenderness in the right upper quadrant and right lower quadrant. There is right CVA tenderness. There is no left CVA tenderness.      Comments: Tenderness to right upper quadrant, right lower quadrant, right groin.  Musculoskeletal:     Cervical back: Neck supple.  Skin:    General: Skin is warm and dry.  Neurological:     Mental Status: She is alert and oriented to person, place, and time.  Psychiatric:        Mood and Affect: Mood normal.           Assessment & Plan:  Flank pain Assessment & Plan: HPI and exam today suspicious for either pyelonephritis or renal stone, however cannot exclude appendicitis, ovarian involvement.  CBC with differential, BMP pending. Stat CT abdomen pelvis ordered and pending.  Urinalysis today with 3+ leuks, trace blood.  No nitrites. Urine culture ordered and pending.  Strict  ED precautions provided. Await results.  Orders: -     CT ABDOMEN PELVIS W CONTRAST; Future -     CBC with Differential/Platelet -     Basic metabolic panel with GFR -      Urine Culture  Right lower quadrant abdominal pain -     CT ABDOMEN PELVIS W CONTRAST; Future  Gross hematuria -     POCT Urinalysis Dipstick (Automated) -     Urine Culture        Comer MARLA Gaskins, NP

## 2024-01-09 NOTE — Patient Instructions (Signed)
 Stop by the lab prior to leaving today. I will notify you of your results once received.   You will receive a phone call regarding the CT scan.  It was a pleasure to see you today!

## 2024-01-09 NOTE — Assessment & Plan Note (Addendum)
 HPI and exam today suspicious for either pyelonephritis or renal stone, however cannot exclude appendicitis, ovarian involvement.  CBC with differential, BMP pending. Stat CT abdomen pelvis ordered and pending.  Urinalysis today with 3+ leuks, trace blood.  No nitrites. Urine culture ordered and pending.  Strict ED precautions provided. Await results.

## 2024-01-10 LAB — URINE CULTURE
MICRO NUMBER:: 16682500
Result:: NO GROWTH
SPECIMEN QUALITY:: ADEQUATE

## 2024-01-23 ENCOUNTER — Other Ambulatory Visit: Payer: Self-pay | Admitting: Primary Care

## 2024-01-23 DIAGNOSIS — E1165 Type 2 diabetes mellitus with hyperglycemia: Secondary | ICD-10-CM

## 2024-01-24 ENCOUNTER — Other Ambulatory Visit: Payer: Self-pay | Admitting: Primary Care

## 2024-01-24 DIAGNOSIS — F32A Depression, unspecified: Secondary | ICD-10-CM

## 2024-01-24 MED ORDER — VENLAFAXINE HCL ER 37.5 MG PO CP24: ORAL_CAPSULE | ORAL | 2 refills | Status: AC

## 2024-01-24 NOTE — Telephone Encounter (Signed)
 Please call patient:  Received refill request for Ozempic  0.5 mg dose yet the 1 mg dose is on her profile. Which dose is she one? It looks like I didn't prescribe the 1 mg dose.

## 2024-01-24 NOTE — Telephone Encounter (Signed)
 Unable to reach patient. Left voicemail to return call to our office.

## 2024-01-24 NOTE — Telephone Encounter (Unsigned)
 Copied from CRM [id number]. Topic: Clinical - Medication Refill >> Jan 24, 2024 10:07 AM Burnard DEL wrote: Medication:  venlafaxine  XR (EFFEXOR -XR) 37.5 MG 24 hr capsule   Has the patient contacted their pharmacy? Yes (Agent: If no, request that the patient contact the pharmacy for the refill. If patient does not wish to contact the pharmacy document the reason why and proceed with request.) (Agent: If yes, when and what did the pharmacy advise?)  This is the patient's preferred pharmacy:  [facility] - [place], [place] - [address] [facility] [address] [place] [address] [place] [address] Phone: [phone] Fax: [phone]    Is this the correct pharmacy for this prescription? Yes If no, delete pharmacy and type the correct one.   Has the prescription been filled recently? No  Is the patient out of the medication? Yes  Has the patient been seen for an appointment in the last year OR does the patient have an upcoming appointment? Yes  Can we respond through MyChart? Yes  Agent: Please be advised that Rx refills may take up to 3 business days. We ask that you follow-up with your pharmacy.

## 2024-01-24 NOTE — Telephone Encounter (Signed)
 Copied from CRM #[id number]. Topic: General - Other >> Jan 24, 2024 10:05 AM Burnard DEL wrote: Reason for CRM: Patient returned call regarding ozempic  dosage.Patient stated that she is on .5. She just started ,so she have 3 more dosages left,she has only taken 1 dosage.

## 2024-02-07 ENCOUNTER — Encounter: Payer: Self-pay | Admitting: Primary Care

## 2024-02-07 ENCOUNTER — Ambulatory Visit: Payer: Self-pay | Admitting: Primary Care

## 2024-02-07 ENCOUNTER — Ambulatory Visit: Admitting: Primary Care

## 2024-02-07 VITALS — BP 122/70 | HR 76 | Temp 97.3°F | Ht 68.0 in | Wt 200.0 lb

## 2024-02-07 DIAGNOSIS — Z7984 Long term (current) use of oral hypoglycemic drugs: Secondary | ICD-10-CM | POA: Diagnosis not present

## 2024-02-07 DIAGNOSIS — E1165 Type 2 diabetes mellitus with hyperglycemia: Secondary | ICD-10-CM

## 2024-02-07 LAB — POCT GLYCOSYLATED HEMOGLOBIN (HGB A1C): Hemoglobin A1C: 5.7 % — AB (ref 4.0–5.6)

## 2024-02-07 NOTE — Assessment & Plan Note (Signed)
 Improved with A1c of 5.7 today.  Continue Ozempic  at 0.5 mg weekly x 2 more weeks.  She will increase to the 1 mg weekly thereafter, more so for weight loss benefit.  Foot exam today.  Follow-up in 6 months.

## 2024-02-07 NOTE — Progress Notes (Signed)
 Subjective:    Patient ID: Elaine Young, female    DOB: [birth year], 63 y.o.   MRN: [id number]  HPI  Elaine Young is a very pleasant 63 y.o. female with a history of type 2 diabetes, hypertension, hyperlipidemia who presents today for follow-up of diabetes.  Current medications include: Ozempic  0.5 mg weekly. She will start on the 1 mg dose in 2 weeks. She initially experienced nausea but this has resolved. She does have constipation but is working to help these symptoms   She is checking her blood glucose 2 times weekly and is getting readings of:  AM fasting: low 100s 2 hours after dinner: mid 100s  Last A1C: 6.7 in May 2025, 5.7 today Last Eye Exam: Due Last Foot Exam: Due Pneumonia Vaccination: Never completed  Urine Microalbumin: Up-to-date Statin: Atorvastatin   Dietary changes since last visit: Smaller portion sizes. Increased protein. Limiting hot dogs.    Exercise: None.  Wt Readings from Last 3 Encounters:  02/07/24 200 lb (90.7 kg)  01/09/24 202 lb (91.6 kg)  12/31/23 200 lb (90.7 kg)       Review of Systems  Respiratory:  Negative for shortness of breath.   Cardiovascular:  Negative for chest pain.  Gastrointestinal:  Positive for constipation. Negative for nausea.  Neurological:  Negative for numbness.         Past Medical History:  Diagnosis Date   Acne vulgaris 09/12/2021   Carpal tunnel syndrome    Cervical disc disorder    Complication of anesthesia    Essential hypertension    Functional incontinence    Generalized anxiety disorder    Hyperlipidemia    Intractable vomiting with nausea 08/22/2022   Lower extremity numbness    Lumbar disc disease    Migraines    Norovirus 08/21/2022   PONV (postoperative nausea and vomiting)    per pt, she usually gets zofran  and scopalamine patch    Post-viral cough syndrome 08/02/2021   Renal disorder    kidney stone   S/P lumbar fusion 11/16/2019   Seasonal allergies    Vitamin D  deficiency     Social History   Socioeconomic History   Marital status: Married    Spouse name: Not on file   Number of children: Not on file   Years of education: Not on file   Highest education level: Associate degree: occupational, Scientist, product/process development, or vocational program  Occupational History   Not on file  Tobacco Use   Smoking status: Never   Smokeless tobacco: Never  Vaping Use   Vaping status: Never Used  Substance and Sexual Activity   Alcohol use: Yes    Alcohol/week: 0.0 standard drinks of alcohol   Drug use: No   Sexual activity: Not on file  Other Topics Concern   Not on file  Social History Narrative   Married.   1 child. 1 grandchild.   Retired.    Enjoys reading.    Social Drivers of Corporate investment banker Strain: Low Risk  (11/06/2023)   Overall Financial Resource Strain (CARDIA)    Difficulty of Paying Living Expenses: Not hard at all  Food Insecurity: No Food Insecurity (11/06/2023)   Hunger Vital Sign    Worried About Running Out of Food in the Last Year: Never true    Ran Out of Food in the Last Year: Never true  Transportation Needs: No Transportation Needs (11/06/2023)   PRAPARE - Administrator, Civil Service (Medical): No  Lack of Transportation (Non-Medical): No  Physical Activity: Insufficiently Active (11/06/2023)   Exercise Vital Sign    Days of Exercise per Week: 3 days    Minutes of Exercise per Session: 30 min  Stress: Stress Concern Present (11/06/2023)   [name] of Occupational Health - Occupational Stress Questionnaire    Feeling of Stress : To some extent  Social Connections: Moderately Integrated (11/06/2023)   Social Connection and Isolation Panel    Frequency of Communication with Friends and Family: More than three times a week    Frequency of Social Gatherings with Friends and Family: Three times a week    Attends Religious Services: More than 4 times per year    Active Member of Clubs or Organizations: No     Attends Banker Meetings: Not on file    Marital Status: Married  Intimate Partner Violence: Not At Risk (08/21/2022)   Humiliation, Afraid, Rape, and Kick questionnaire    Fear of Current or Ex-Partner: No    Emotionally Abused: No    Physically Abused: No    Sexually Abused: No    Past Surgical History:  Procedure Laterality Date   ABDOMINAL EXPOSURE N/A 11/16/2019   Procedure: ABDOMINAL EXPOSURE;  Surgeon: Gretta Lonni PARAS, MD;  Location: [facility] OR;  Service: Vascular;  Laterality: N/A;   ABDOMINAL HYSTERECTOMY     ANTERIOR LUMBAR FUSION N/A 11/16/2019   Procedure: LUMBAR FIVE-SACRAL ONE ANTERIOR LUMBAR INTERBODY FUSION;  Surgeon: Joshua Alm RAMAN, MD;  Location: [facility] OR;  Service: Neurosurgery;  Laterality: N/A;  anterior approach   BLADDER SUSPENSION  2005   BREAST BIOPSY Bilateral yrs ago in [place]   high risk lesions, not cancer per pt   BREAST CYST EXCISION Bilateral yrs ago in [place]   hard to see some excision areas   CHOLECYSTECTOMY     Neck fusion  12/28/2022   TONSILLECTOMY      Family History  Problem Relation Age of Onset   Hyperlipidemia Mother    Breast cancer Mother 2   Ovarian cancer Maternal Aunt    Lung cancer Maternal Uncle    Arthritis Maternal Grandmother    Arthritis Maternal Grandfather    Colon cancer Maternal Grandfather    Arthritis Paternal Grandmother    Hypertension Paternal Grandmother    Diabetes Paternal Grandmother    Arthritis Paternal Grandfather    Diabetes Paternal Grandfather    Arthritis Cousin    Breast cancer Cousin        maternal side x2    Allergies  Allergen Reactions   Codeine Nausea And Vomiting   Latex Rash   Lipitor [Atorvastatin ] Rash   Shellfish Allergy Swelling and Rash   Sulfa Antibiotics Rash    Current Outpatient Medications on File Prior to Visit  Medication Sig Dispense Refill   acetaminophen  (TYLENOL ) 500 MG tablet Take 1,000 mg by mouth daily as needed for mild pain (pain score 1-3) or moderate  pain (pain score 4-6).     atorvastatin  (LIPITOR) 40 MG tablet Take 1 tablet (40 mg total) by mouth daily. for cholesterol. 90 tablet 0   ezetimibe  (ZETIA ) 10 MG tablet Take 1 tablet (10 mg total) by mouth daily. 90 tablet 3   Glucose Blood (BLOOD GLUCOSE TEST STRIPS) STRP 1 each by In Vitro route in the morning, at noon, and at bedtime. May substitute to any manufacturer covered by patient's insurance. 100 strip 5   Lancets Misc. MISC 1 each by Does not apply route in  the morning, at noon, and at bedtime. May substitute to any manufacturer covered by patient's insurance. 100 each 5   methocarbamol  (ROBAXIN ) 500 MG tablet Take 500 mg by mouth at bedtime as needed for muscle spasms.     metoprolol  succinate (TOPROL  XL) 25 MG 24 hr tablet Take 1 tablet (25 mg total) by mouth daily. 90 tablet 3   Semaglutide  (OZEMPIC , 1 MG/DOSE, [place]) Inject 1 mg into the skin once a week.     Semaglutide ,0.25 or 0.5MG /DOS, (OZEMPIC , 0.25 OR 0.5 MG/DOSE,) 2 MG/3ML SOPN Inject 0.25 mg into the skin once weekly for 4 weeks, then increase to 0.5 mg once weekly thereafter for diabetes. 9 mL 0   valsartan  (DIOVAN ) 80 MG tablet Take 1 tablet (80 mg total) by mouth daily. for blood pressure. 90 tablet 2   venlafaxine  XR (EFFEXOR -XR) 150 MG 24 hr capsule TAKE 1 CAPSULE BY MOUTH DAILY  WITH BREAKFAST FOR ANXIETY AND  DEPRESSION. TAKE WITH  VENLAFAXINE  XR 37.5MG . 90 capsule 3   venlafaxine  XR (EFFEXOR -XR) 37.5 MG 24 hr capsule TAKE 1 CAPSULE BY MOUTH DAILY  WITH BREAKFAST FOR DEPRESSION  AND ANXIETY, TAKE WITH 150 MG. 90 capsule 2   No current facility-administered medications on file prior to visit.    BP 122/70   Pulse 76   Temp (!) 97.3 F (36.3 C) (Temporal)   Ht 5' 8 (1.727 m)   Wt 200 lb (90.7 kg)   SpO2 96%   BMI 30.41 kg/m  Objective:   Physical Exam Cardiovascular:     Rate and Rhythm: Normal rate and regular rhythm.  Pulmonary:     Effort: Pulmonary effort is normal.     Breath sounds: Normal breath  sounds.  Musculoskeletal:     Cervical back: Neck supple.  Skin:    General: Skin is warm and dry.  Neurological:     Mental Status: She is alert and oriented to person, place, and time.  Psychiatric:        Mood and Affect: Mood normal.           Assessment & Plan:  Type 2 diabetes mellitus with hyperglycemia, without long-term current use of insulin (HCC) Assessment & Plan: Improved with A1c of 5.7 today.  Continue Ozempic  at 0.5 mg weekly x 2 more weeks.  She will increase to the 1 mg weekly thereafter, more so for weight loss benefit.  Foot exam today.  Follow-up in 6 months.  Orders: -     POCT glycosylated hemoglobin (Hb A1C)        Comer MARLA Gaskins, NP

## 2024-02-07 NOTE — Patient Instructions (Signed)
 Increase to Ozempic  1 mg weekly once received.  Please schedule a follow up visit for 6 months for a diabetes check.  It was a pleasure to see you today!

## 2024-02-09 NOTE — Telephone Encounter (Signed)
 Lab, you guys know what this means?  Please see attachment.  Her urine culture resulted 01/09/2024.

## 2024-02-21 ENCOUNTER — Other Ambulatory Visit: Payer: Self-pay

## 2024-02-21 DIAGNOSIS — E1165 Type 2 diabetes mellitus with hyperglycemia: Secondary | ICD-10-CM

## 2024-02-25 ENCOUNTER — Other Ambulatory Visit: Payer: Self-pay | Admitting: Primary Care

## 2024-02-25 DIAGNOSIS — E1165 Type 2 diabetes mellitus with hyperglycemia: Secondary | ICD-10-CM

## 2024-02-25 MED ORDER — SEMAGLUTIDE (1 MG/DOSE) 4 MG/3ML ~~LOC~~ SOPN: 1.0000 mg | PEN_INJECTOR | SUBCUTANEOUS | 0 refills | Status: DC

## 2024-02-25 MED ORDER — SEMAGLUTIDE (1 MG/DOSE) 4 MG/3ML ~~LOC~~ SOPN: 1.0000 mg | PEN_INJECTOR | SUBCUTANEOUS | Status: DC

## 2024-02-25 NOTE — Telephone Encounter (Unsigned)
 Copied from CRM #[id number]. Topic: Clinical - Medication Refill >> Feb 25, 2024 11:48 AM Deaijah H wrote: Medication: Semaglutide ,0.25 or 0.5MG /DOS, (OZEMPIC , 0.25 OR 0.5 MG/DOSE,) 2 MG/3ML SOPN  Has the patient contacted their pharmacy? Yes (Agent: If no, request that the patient contact the pharmacy for the refill. If patient does not wish to contact the pharmacy document the reason why and proceed with request.) (Agent: If yes, when and what did the pharmacy advise?) They faxed it to Dr. But no response   This is the patient's preferred pharmacy:  [facility] [address] [place] - [place], [place] - [address] [place] [address] Phone: [phone] Fax: [phone]  Is this the correct pharmacy for this prescription? Yes If no, delete pharmacy and type the correct one.   Has the prescription been filled recently? No  Is the patient out of the medication? Yes  Has the patient been seen for an appointment in the last year OR does the patient have an upcoming appointment? Yes  Can we respond through MyChart? Yes  Agent: Please be advised that Rx refills may take up to 3 business days. We ask that you follow-up with your pharmacy.

## 2024-02-25 NOTE — Telephone Encounter (Signed)
 Called patient and reviewed all information. Patient verbalized understanding. Will call if any further questions.

## 2024-02-25 NOTE — Telephone Encounter (Signed)
 Please call patient:  Let her know I sent the 1 mg dose of Ozempic  to [facility].

## 2024-04-29 ENCOUNTER — Other Ambulatory Visit: Payer: Self-pay | Admitting: Medical Genetics

## 2024-04-29 DIAGNOSIS — Z006 Encounter for examination for normal comparison and control in clinical research program: Secondary | ICD-10-CM

## 2024-06-10 ENCOUNTER — Other Ambulatory Visit: Payer: Self-pay | Admitting: Primary Care

## 2024-06-10 DIAGNOSIS — E1165 Type 2 diabetes mellitus with hyperglycemia: Secondary | ICD-10-CM

## 2024-08-06 ENCOUNTER — Ambulatory Visit: Payer: Self-pay

## 2024-08-06 NOTE — Telephone Encounter (Signed)
 It looks like I have several appointments available for tomorrow. Can we get her scheduled?

## 2024-08-06 NOTE — Telephone Encounter (Signed)
 Called and schedule pt for possible UTI

## 2024-08-06 NOTE — Telephone Encounter (Signed)
 FYI Only or Action Required?: Action required by provider: request for appointment.  Patient was last seen in primary care on 02/07/2024 by Gretta Comer POUR, NP.  Called Nurse Triage reporting Urinary Tract Infection.  Symptoms began a week ago.  Interventions attempted: Nothing.  Symptoms are: unchanged.  Triage Disposition: See HCP Within 4 Hours (Or PCP Triage)  Patient/caregiver understands and will follow disposition?: No, wishes to speak with PCP   Message from [facility] sent at 08/06/2024 12:38 PM EST  Reason for Triage: UTI, Pain in kidney   Reason for Disposition  Side (flank) or lower back pain present  Answer Assessment - Initial Assessment Questions No available appts within 4 hours/today. Advised UC today and ED/911 if symptoms worsen: severed flank/abd pain, fever, blood urine, diff breathing, chest pain, faint. Patient verbalized understanding.  Patient requesting appt tomorrow.  1. SYMPTOM: What's the main symptom you're concerned about? (e.g., frequency, incontinence)     Odor 2. ONSET: When did the    start?     week 3. PAIN: Is there any pain? If Yes, ask: How bad is it? (Scale: 1-10; mild, moderate, severe)     4/10, constant; sharp right flank pain 4. CAUSE: What do you think is causing the symptoms?     uti 5. OTHER SYMPTOMS: Do you have any other symptoms? (e.g., blood in urine, fever, flank pain, pain with urination)     Right flank pain, denies diff breathing, faint,fever chills,n/v  Protocols used: Urinary Symptoms-A-AH

## 2024-08-06 NOTE — Telephone Encounter (Signed)
 Noted.

## 2024-08-07 ENCOUNTER — Ambulatory Visit: Admitting: Primary Care

## 2024-08-07 ENCOUNTER — Encounter: Payer: Self-pay | Admitting: Primary Care

## 2024-08-07 VITALS — BP 122/68 | HR 80 | Temp 97.9°F | Ht 68.0 in | Wt 194.2 lb

## 2024-08-07 DIAGNOSIS — E1165 Type 2 diabetes mellitus with hyperglycemia: Secondary | ICD-10-CM

## 2024-08-07 DIAGNOSIS — M545 Low back pain, unspecified: Secondary | ICD-10-CM | POA: Insufficient documentation

## 2024-08-07 LAB — POC URINALSYSI DIPSTICK (AUTOMATED)
Bilirubin, UA: NEGATIVE
Glucose, UA: NEGATIVE
Ketones, UA: NEGATIVE
Nitrite, UA: NEGATIVE
Protein, UA: POSITIVE — AB
Spec Grav, UA: 1.015
Urobilinogen, UA: 0.2 U/dL
pH, UA: 7

## 2024-08-07 LAB — POCT GLYCOSYLATED HEMOGLOBIN (HGB A1C): Hemoglobin A1C: 5.6 % (ref 4.0–5.6)

## 2024-08-07 MED ORDER — TAMSULOSIN HCL 0.4 MG PO CAPS: 0.4000 mg | ORAL_CAPSULE | Freq: Every day | ORAL | 0 refills | Status: AC

## 2024-08-07 NOTE — Assessment & Plan Note (Signed)
 Improved and well-controlled with A1c of 5.6 today  Continue Ozempic  1 mg weekly  Follow-up in 6 months

## 2024-08-07 NOTE — Patient Instructions (Signed)
 Start tamsulosin  0.4 mg daily for urine flow/potential kidney stone.  I will be in touch in a few days regarding the urine culture results.  Schedule your physical for the summer  It was a pleasure to see you today!

## 2024-08-07 NOTE — Progress Notes (Signed)
 "  Subjective:    Patient ID: Elaine Young, female    DOB: 1961-04-05, 64 y.o.   MRN: [id number]  Elaine Young is a very pleasant 64 y.o. female with a history of type 2 diabetes, hypertension, acute pyelonephritis, microscopic hematuria, gross hematuria who presents today to discuss back pain and for diabetes follow-up.   1) Type 2 Diabetes:  Current medications include: Ozempic  1 mg weekly.   She is checking her blood glucose 1 times daily and is getting readings of:  AM fasting: 90s Sometimes during the day: 80s.   Last A1C: 5.06 February 2024, 5.6 today. Last Eye Exam: Due Last Foot Exam: UTD Pneumonia Vaccination: Never completed  Urine Microalbumin: UTD Statin: atorvastatin    Dietary changes since last visit: Smaller portion sizes, less carbs/breads, 2 times weekly eating out.    Exercise: None   Wt Readings from Last 3 Encounters:  08/07/24 194 lb 4 oz (88.1 kg)  02/07/24 200 lb (90.7 kg)  01/09/24 202 lb (91.6 kg)     2) Back Pain: Symptom onset 10 days ago with right upper lumbar back pain. She has also felt more fatigued/rundown than usual.  She denies hematuria, urinary frequency, vaginal discharge, pelvic pain, paresthesias, radiation of pain.  She has history of renal stones and the symptoms feel similar.  She has noticed urinary urgency  She was evaluated by Urology in September 2025, underwent cystoscopy which did not show abnormality. She will now be following with nephrology for evaluation of her kidneys. Evaluated by neurosurgery in October 2025 and underwent MRI of the cervical spine and lumbar spine which were both unremarkable.   She suspects her symptoms are secondary to a renal stone.   Review of Systems  Gastrointestinal:  Negative for diarrhea, nausea and vomiting.  Genitourinary:  Negative for dysuria, frequency, hematuria and urgency.  Musculoskeletal:  Positive for back pain.         Past Medical History:  Diagnosis Date    Acne vulgaris 09/12/2021   Carpal tunnel syndrome    Cervical disc disorder    Complication of anesthesia    Essential hypertension    Functional incontinence    Generalized anxiety disorder    Hyperlipidemia    Intractable vomiting with nausea 08/22/2022   Lower extremity numbness    Lumbar disc disease    Migraines    Norovirus 08/21/2022   PONV (postoperative nausea and vomiting)    per pt, she usually gets zofran  and scopalamine patch    Post-viral cough syndrome 08/02/2021   Renal disorder    kidney stone   S/P lumbar fusion 11/16/2019   Seasonal allergies    Vitamin D deficiency     Social History   Socioeconomic History   Marital status: Married    Spouse name: Not on file   Number of children: Not on file   Years of education: Not on file   Highest education level: Associate degree: occupational, scientist, product/process development, or vocational program  Occupational History   Not on file  Tobacco Use   Smoking status: Never   Smokeless tobacco: Never  Vaping Use   Vaping status: Never Used  Substance and Sexual Activity   Alcohol use: Yes    Alcohol/week: 0.0 standard drinks of alcohol   Drug use: No   Sexual activity: Not on file  Other Topics Concern   Not on file  Social History Narrative   Married.   1 child. 1 grandchild.   Retired.  Enjoys reading.    Social Drivers of Health   Tobacco Use: Low Risk (08/07/2024)   Patient History    Smoking Tobacco Use: Never    Smokeless Tobacco Use: Never    Passive Exposure: Not on file  Financial Resource Strain: Low Risk (08/06/2024)   Overall Financial Resource Strain (CARDIA)    Difficulty of Paying Living Expenses: Not hard at all  Food Insecurity: No Food Insecurity (08/06/2024)   Epic    Worried About Radiation Protection Practitioner of Food in the Last Year: Never true    Ran Out of Food in the Last Year: Never true  Transportation Needs: No Transportation Needs (08/06/2024)   Epic    Lack of Transportation (Medical): No    Lack of  Transportation (Non-Medical): No  Physical Activity: Insufficiently Active (08/06/2024)   Exercise Vital Sign    Days of Exercise per Week: 2 days    Minutes of Exercise per Session: 20 min  Stress: No Stress Concern Present (08/06/2024)   [name] of Occupational Health - Occupational Stress Questionnaire    Feeling of Stress: Not at all  Social Connections: Moderately Integrated (08/06/2024)   Social Connection and Isolation Panel    Frequency of Communication with Friends and Family: More than three times a week    Frequency of Social Gatherings with Friends and Family: More than three times a week    Attends Religious Services: More than 4 times per year    Active Member of Clubs or Organizations: No    Attends Banker Meetings: Not on file    Marital Status: Married  Intimate Partner Violence: Not At Risk (08/21/2022)   Humiliation, Afraid, Rape, and Kick questionnaire    Fear of Current or Ex-Partner: No    Emotionally Abused: No    Physically Abused: No    Sexually Abused: No  Depression (PHQ2-9): Low Risk (08/07/2024)   Depression (PHQ2-9)    PHQ-2 Score: 1  Alcohol Screen: Low Risk (08/06/2024)   Alcohol Screen    Last Alcohol Screening Score (AUDIT): 1  Housing: Low Risk (08/06/2024)   Epic    Unable to Pay for Housing in the Last Year: No    Number of Times Moved in the Last Year: 0    Homeless in the Last Year: No  Utilities: Not At Risk (08/21/2022)   [employer] Utilities    Threatened with loss of utilities: No  Health Literacy: Not on file    Past Surgical History:  Procedure Laterality Date   ABDOMINAL EXPOSURE N/A 11/16/2019   Procedure: ABDOMINAL EXPOSURE;  Surgeon: Gretta Lonni PARAS, MD;  Location: [facility] OR;  Service: Vascular;  Laterality: N/A;   ABDOMINAL HYSTERECTOMY     ANTERIOR LUMBAR FUSION N/A 11/16/2019   Procedure: LUMBAR FIVE-SACRAL ONE ANTERIOR LUMBAR INTERBODY FUSION;  Surgeon: Joshua Alm RAMAN, MD;  Location[facility] OR;  Service: Neurosurgery;   Laterality: N/A;  anterior approach   BLADDER SUSPENSION  2005   BREAST BIOPSY Bilateral yrs ago in [place]   high risk lesions, not cancer per pt   BREAST CYST EXCISION Bilateral yrs ago in [place]   hard to see some excision areas   CHOLECYSTECTOMY     Neck fusion  12/28/2022   TONSILLECTOMY      Family History  Problem Relation Age of Onset   Hyperlipidemia Mother    Breast cancer Mother 72   Ovarian cancer Maternal Aunt    Lung cancer Maternal Uncle    Arthritis Maternal Grandmother  Arthritis Maternal Grandfather    Colon cancer Maternal Grandfather    Arthritis Paternal Grandmother    Hypertension Paternal Grandmother    Diabetes Paternal Grandmother    Arthritis Paternal Grandfather    Diabetes Paternal Grandfather    Arthritis Cousin    Breast cancer Cousin        maternal side x2    Allergies[1]  Medications Ordered Prior to Encounter[2]  BP 122/68   Pulse 80   Temp 97.9 F (36.6 C) (Oral)   Ht 5' 8 (1.727 m)   Wt 194 lb 4 oz (88.1 kg)   SpO2 99%   BMI 29.54 kg/m  Objective:   Physical Exam Constitutional:      General: She is not in acute distress. Cardiovascular:     Rate and Rhythm: Normal rate and regular rhythm.  Pulmonary:     Effort: Pulmonary effort is normal.     Breath sounds: Normal breath sounds.  Abdominal:     Tenderness: There is right CVA tenderness.  Musculoskeletal:     Thoracic back: No bony tenderness.     Lumbar back: Tenderness present. No bony tenderness. Normal range of motion.       Back:  Skin:    General: Skin is warm and dry.     Physical Exam        Assessment & Plan:  Type 2 diabetes mellitus with hyperglycemia, without long-term current use of insulin (HCC) Assessment & Plan: Improved and well-controlled with A1c of 5.6 today  Continue Ozempic  1 mg weekly  Follow-up in 6 months  Orders: -     POCT glycosylated hemoglobin (Hb A1C)  Acute right-sided low back pain, unspecified whether sciatica  present Assessment & Plan: Urinalysis today with 3+ leuks, positive blood. Urine culture ordered and pending.  Clinically, she does not appear to have acute pyelonephritis. Reviewed prior urinalysis with urine cultures.  Interestingly, all of her culture negative urinalysis tests contained leuks and blood.  Will await culture results prior to decision on antibiotics. Start tamsulosin  0.4 mg daily for potential renal stone.  She will follow-up with nephrology as requested.   Orders: -     POCT Urinalysis Dipstick (Automated) -     Urine Culture -     Tamsulosin  HCl; Take 1 capsule (0.4 mg total) by mouth daily.  Dispense: 30 capsule; Refill: 0    Assessment and Plan Assessment & Plan         Comer MARLA Gaskins, NP       [1]  Allergies Allergen Reactions   Macrobid  [Nitrofurantoin ] Hives   Codeine Nausea And Vomiting   Latex Rash   Lipitor [Atorvastatin ] Rash   Shellfish Allergy Swelling and Rash   Sulfa Antibiotics Rash  [2]  Current Outpatient Medications on File Prior to Visit  Medication Sig Dispense Refill   acetaminophen  (TYLENOL ) 500 MG tablet Take 1,000 mg by mouth daily as needed for mild pain (pain score 1-3) or moderate pain (pain score 4-6).     atorvastatin  (LIPITOR) 40 MG tablet Take 1 tablet (40 mg total) by mouth daily. for cholesterol. 90 tablet 0   ezetimibe  (ZETIA ) 10 MG tablet Take 1 tablet (10 mg total) by mouth daily. 90 tablet 3   Glucose Blood (BLOOD GLUCOSE TEST STRIPS) STRP 1 each by In Vitro route in the morning, at noon, and at bedtime. May substitute to any manufacturer covered by patient's insurance. 100 strip 5   Lancets Misc. MISC 1 each by Does not  apply route in the morning, at noon, and at bedtime. May substitute to any manufacturer covered by patient's insurance. 100 each 5   methocarbamol  (ROBAXIN ) 500 MG tablet Take 500 mg by mouth at bedtime as needed for muscle spasms.     metoprolol  succinate (TOPROL  XL) 25 MG 24 hr tablet  Take 1 tablet (25 mg total) by mouth daily. 90 tablet 3   Semaglutide , 1 MG/DOSE, (OZEMPIC , 1 MG/DOSE,) 4 MG/3ML SOPN Inject 1 mg into the skin once a week. for diabetes. 9 mL 0   valsartan  (DIOVAN ) 80 MG tablet Take 1 tablet (80 mg total) by mouth daily. for blood pressure. 90 tablet 2   venlafaxine  XR (EFFEXOR -XR) 150 MG 24 hr capsule TAKE 1 CAPSULE BY MOUTH DAILY  WITH BREAKFAST FOR ANXIETY AND  DEPRESSION. TAKE WITH  VENLAFAXINE  XR 37.5MG . 90 capsule 3   venlafaxine  XR (EFFEXOR -XR) 37.5 MG 24 hr capsule TAKE 1 CAPSULE BY MOUTH DAILY  WITH BREAKFAST FOR DEPRESSION  AND ANXIETY, TAKE WITH 150 MG. 90 capsule 2   No current facility-administered medications on file prior to visit.   "

## 2024-08-07 NOTE — Assessment & Plan Note (Addendum)
 Urinalysis today with 3+ leuks, positive blood. Urine culture ordered and pending.  Clinically, she does not appear to have acute pyelonephritis. Reviewed prior urinalysis with urine cultures.  Interestingly, all of her culture negative urinalysis tests contained leuks and blood.  Will await culture results prior to decision on antibiotics. Start tamsulosin  0.4 mg daily for potential renal stone.  She will follow-up with nephrology as requested.

## 2024-08-11 ENCOUNTER — Ambulatory Visit: Admitting: Primary Care

## 2025-01-19 ENCOUNTER — Encounter: Admitting: Primary Care
